# Patient Record
Sex: Female | Born: 1975
Health system: Southern US, Community
[De-identification: ages and names within clinical notes are randomized; demographics above are authoritative.]

## PROBLEM LIST (undated history)

## (undated) DIAGNOSIS — E119 Type 2 diabetes mellitus without complications: Secondary | ICD-10-CM

## (undated) DIAGNOSIS — I10 Essential (primary) hypertension: Secondary | ICD-10-CM

## (undated) DIAGNOSIS — C189 Malignant neoplasm of colon, unspecified: Secondary | ICD-10-CM

## (undated) HISTORY — PX: WISDOM TOOTH EXTRACTION: SHX21

## (undated) HISTORY — DX: Essential (primary) hypertension: I10

## (undated) HISTORY — PX: TUBAL LIGATION: SHX77

## (undated) HISTORY — DX: Malignant neoplasm of colon, unspecified: C18.9

---

## 2001-03-15 ENCOUNTER — Ambulatory Visit (HOSPITAL_COMMUNITY): Admission: RE | Admit: 2001-03-15 | Discharge: 2001-03-15 | Payer: Self-pay | Admitting: *Deleted

## 2001-04-27 ENCOUNTER — Inpatient Hospital Stay (HOSPITAL_COMMUNITY): Admission: AD | Admit: 2001-04-27 | Discharge: 2001-04-27 | Payer: Self-pay | Admitting: *Deleted

## 2001-07-20 ENCOUNTER — Emergency Department (HOSPITAL_COMMUNITY): Admission: EM | Admit: 2001-07-20 | Discharge: 2001-07-20 | Payer: Self-pay | Admitting: Emergency Medicine

## 2001-07-30 ENCOUNTER — Inpatient Hospital Stay (HOSPITAL_COMMUNITY): Admission: AD | Admit: 2001-07-30 | Discharge: 2001-08-01 | Payer: Self-pay | Admitting: *Deleted

## 2003-07-30 ENCOUNTER — Inpatient Hospital Stay (HOSPITAL_COMMUNITY): Admission: AD | Admit: 2003-07-30 | Discharge: 2003-07-30 | Payer: Self-pay | Admitting: *Deleted

## 2003-10-02 ENCOUNTER — Ambulatory Visit (HOSPITAL_COMMUNITY): Admission: RE | Admit: 2003-10-02 | Discharge: 2003-10-02 | Payer: Self-pay | Admitting: *Deleted

## 2004-02-19 ENCOUNTER — Inpatient Hospital Stay (HOSPITAL_COMMUNITY): Admission: AD | Admit: 2004-02-19 | Discharge: 2004-02-21 | Payer: Self-pay | Admitting: *Deleted

## 2004-02-19 ENCOUNTER — Ambulatory Visit: Payer: Self-pay | Admitting: Obstetrics and Gynecology

## 2005-08-10 ENCOUNTER — Emergency Department: Payer: Self-pay | Admitting: Unknown Physician Specialty

## 2005-08-21 ENCOUNTER — Emergency Department: Payer: Self-pay | Admitting: Emergency Medicine

## 2012-07-09 LAB — HM PAP SMEAR

## 2014-01-05 ENCOUNTER — Ambulatory Visit (INDEPENDENT_AMBULATORY_CARE_PROVIDER_SITE_OTHER): Payer: 59 | Admitting: Internal Medicine

## 2014-01-05 ENCOUNTER — Encounter: Payer: Self-pay | Admitting: Internal Medicine

## 2014-01-05 VITALS — BP 102/62 | HR 74 | Temp 98.3°F | Ht 63.25 in | Wt 254.0 lb

## 2014-01-05 DIAGNOSIS — I152 Hypertension secondary to endocrine disorders: Secondary | ICD-10-CM | POA: Insufficient documentation

## 2014-01-05 DIAGNOSIS — Z0000 Encounter for general adult medical examination without abnormal findings: Secondary | ICD-10-CM

## 2014-01-05 DIAGNOSIS — I10 Essential (primary) hypertension: Secondary | ICD-10-CM | POA: Insufficient documentation

## 2014-01-05 LAB — CBC
HCT: 34.8 % — ABNORMAL LOW (ref 36.0–46.0)
Hemoglobin: 11.9 g/dL — ABNORMAL LOW (ref 12.0–15.0)
MCH: 27.9 pg (ref 26.0–34.0)
MCHC: 34.2 g/dL (ref 30.0–36.0)
MCV: 81.7 fL (ref 78.0–100.0)
Platelets: 277 10*3/uL (ref 150–400)
RBC: 4.26 MIL/uL (ref 3.87–5.11)
RDW: 16.3 % — ABNORMAL HIGH (ref 11.5–15.5)
WBC: 5.6 10*3/uL (ref 4.0–10.5)

## 2014-01-05 NOTE — Assessment & Plan Note (Signed)
Low today Will hold lisinopril x 1 month Continue HCTZ Monitor BP at home, call me if > 140/90

## 2014-01-05 NOTE — Progress Notes (Signed)
Pre visit review using our clinic review tool, if applicable. No additional management support is needed unless otherwise documented below in the visit note. 

## 2014-01-05 NOTE — Assessment & Plan Note (Signed)
Start 1500 calorie diet  Use the my fitness pal app Speed up the waling so that you can walk 2-3 miles in 30 minutes

## 2014-01-05 NOTE — Progress Notes (Signed)
HPI  Pt presents to the clinic today to establish care. She has not had a PCP in many years.  HTN: Well controlled on lisinopril and hydrochlorothiazide  Obesity: She is concerned about her weight. She reports she walks 2-3 miles per day in about 30-45. She reports that she does not adhere to any specific diet but she has cut back on portion sizes and sweets.  Flu: 2014 Tetanus: 2-3 years ago LMP: 12/28/13 Pap Smear: 2014 Dentist: as needed  Past Medical History  Diagnosis Date  . Hypertension     Current Outpatient Prescriptions  Medication Sig Dispense Refill  . hydrochlorothiazide (HYDRODIURIL) 25 MG tablet Take 25 mg by mouth daily.      Marland Kitchen lisinopril (PRINIVIL,ZESTRIL) 20 MG tablet Take 20 mg by mouth daily.       No current facility-administered medications for this visit.    No Known Allergies  Family History  Problem Relation Age of Onset  . Hypertension Mother   . Diabetes Maternal Aunt     History   Social History  . Marital Status: Single    Spouse Name: N/A    Number of Children: N/A  . Years of Education: N/A   Occupational History  . Not on file.   Social History Main Topics  . Smoking status: Never Smoker   . Smokeless tobacco: Never Used  . Alcohol Use: Yes     Comment: rare  . Drug Use: No  . Sexual Activity: Not on file   Other Topics Concern  . Not on file   Social History Narrative  . No narrative on file    ROS:  Constitutional: Denies fever, malaise, fatigue, headache or abrupt weight changes.  HEENT: Denies eye pain, eye redness, ear pain, ringing in the ears, wax buildup, runny nose, nasal congestion, bloody nose, or sore throat. Respiratory: Denies difficulty breathing, shortness of breath, cough or sputum production.   Cardiovascular: Denies chest pain, chest tightness, palpitations or swelling in the hands or feet.  Gastrointestinal: Denies abdominal pain, bloating, constipation, diarrhea or blood in the stool.  GU: Denies  frequency, urgency, pain with urination, blood in urine, odor or discharge. Musculoskeletal: Denies decrease in range of motion, difficulty with gait, muscle pain or joint pain and swelling.  Skin: Denies redness, rashes, lesions or ulcercations.  Neurological: Denies dizziness, difficulty with memory, difficulty with speech or problems with balance and coordination.   No other specific complaints in a complete review of systems (except as listed in HPI above).  PE:  BP 102/62  Pulse 74  Temp(Src) 98.3 F (36.8 C) (Oral)  Ht 5' 3.25" (1.607 m)  Wt 254 lb (115.214 kg)  BMI 44.61 kg/m2  SpO2 98%  LMP 12/22/2013 Wt Readings from Last 3 Encounters:  01/05/14 254 lb (115.214 kg)    General: Appears her stated age, obese but well developed, well nourished in NAD. HEENT: Head: normal shape and size; Eyes: sclera white, no icterus, conjunctiva pink, PERRLA and EOMs intact; Ears: Tm's gray and intact, normal light reflex; Nose: mucosa pink and moist, septum midline; Throat/Mouth: Teeth present, mucosa pink and moist, no lesions or ulcerations noted.  Neck: Normal range of motion. Neck supple, trachea midline. No massses, lumps or thyromegaly present.  Cardiovascular: Normal rate and rhythm. S1,S2 noted.  No murmur, rubs or gallops noted. No JVD or BLE edema. No carotid bruits noted. Pulmonary/Chest: Normal effort and positive vesicular breath sounds. No respiratory distress. No wheezes, rales or ronchi noted.  Abdomen: Soft  and nontender. Normal bowel sounds, no bruits noted. No distention or masses noted. Liver, spleen and kidneys non palpable. Musculoskeletal: Normal range of motion. No signs of joint swelling. No difficulty with gait.  Neurological: Alert and oriented. Cranial nerves II-XII intact. Coordination normal. +DTRs bilaterally. Psychiatric: Mood and affect normal. Behavior is normal. Judgment and thought content normal.      Assessment and Plan:  Preventative Health  Maintenance:  Will check screening labs today Encouraged her to visit a dentist on a regular basis  RTC as needed

## 2014-01-05 NOTE — Patient Instructions (Addendum)

## 2014-01-06 LAB — TSH: TSH: 2.75 u[IU]/mL (ref 0.350–4.500)

## 2014-01-06 LAB — COMPREHENSIVE METABOLIC PANEL
ALT: 28 U/L (ref 0–35)
AST: 24 U/L (ref 0–37)
Albumin: 3.7 g/dL (ref 3.5–5.2)
Alkaline Phosphatase: 57 U/L (ref 39–117)
BUN: 8 mg/dL (ref 6–23)
CO2: 29 mEq/L (ref 19–32)
Calcium: 9.2 mg/dL (ref 8.4–10.5)
Chloride: 97 mEq/L (ref 96–112)
Creat: 0.99 mg/dL (ref 0.50–1.10)
Glucose, Bld: 109 mg/dL — ABNORMAL HIGH (ref 70–99)
Potassium: 3.2 mEq/L — ABNORMAL LOW (ref 3.5–5.3)
Sodium: 135 mEq/L (ref 135–145)
Total Bilirubin: 0.5 mg/dL (ref 0.2–1.2)
Total Protein: 7.3 g/dL (ref 6.0–8.3)

## 2014-01-06 LAB — LIPID PANEL
Cholesterol: 135 mg/dL (ref 0–200)
HDL: 36 mg/dL — ABNORMAL LOW (ref >39)
LDL Cholesterol: 74 mg/dL (ref 0–99)
Total CHOL/HDL Ratio: 3.8 Ratio
Triglycerides: 125 mg/dL (ref <150)
VLDL: 25 mg/dL (ref 0–40)

## 2014-01-06 LAB — HEMOGLOBIN A1C
Hgb A1c MFr Bld: 6.3 % — ABNORMAL HIGH (ref <5.7)
Mean Plasma Glucose: 134 mg/dL — ABNORMAL HIGH (ref <117)

## 2014-01-08 ENCOUNTER — Telehealth: Payer: Self-pay | Admitting: Internal Medicine

## 2014-01-08 NOTE — Telephone Encounter (Signed)
Relevant patient education assigned to patient using Emmi. ° °

## 2014-01-10 ENCOUNTER — Other Ambulatory Visit: Payer: Self-pay | Admitting: *Deleted

## 2014-01-10 MED ORDER — POTASSIUM CHLORIDE ER 10 MEQ PO TBCR: 10.0000 meq | EXTENDED_RELEASE_TABLET | Freq: Every day | ORAL | Status: DC

## 2014-02-05 ENCOUNTER — Ambulatory Visit (INDEPENDENT_AMBULATORY_CARE_PROVIDER_SITE_OTHER): Payer: 59 | Admitting: Internal Medicine

## 2014-02-05 ENCOUNTER — Encounter (INDEPENDENT_AMBULATORY_CARE_PROVIDER_SITE_OTHER): Payer: Self-pay

## 2014-02-05 ENCOUNTER — Encounter: Payer: Self-pay | Admitting: Internal Medicine

## 2014-02-05 VITALS — BP 142/88 | HR 66 | Temp 98.5°F | Wt 256.0 lb

## 2014-02-05 DIAGNOSIS — I10 Essential (primary) hypertension: Secondary | ICD-10-CM

## 2014-02-05 MED ORDER — HYDROCHLOROTHIAZIDE 25 MG PO TABS: 25.0000 mg | ORAL_TABLET | Freq: Every day | ORAL | Status: DC

## 2014-02-05 NOTE — Progress Notes (Signed)
Subjective:    Patient ID: Kathy [name], female    DOB: Jun 13, 1975, 38 y.o.   MRN: [id number]  HPI  Pt presents to the clinic today for 1 month follow up of HTN. At her last visit, her Lisinipril was held because she was concerned about her BP being too low. Her BP at that time was 102/62. She was advised to stop the Lisinopril but continue the HCTZ. Her BP today is 142/88. She has gained 2 lbs since her last visit. She is not currently adhering to any diet or exercise regimen.  Review of Systems      Past Medical History  Diagnosis Date  . Hypertension     Current Outpatient Prescriptions  Medication Sig Dispense Refill  . hydrochlorothiazide (HYDRODIURIL) 25 MG tablet Take 25 mg by mouth daily.      Marland Kitchen lisinopril (PRINIVIL,ZESTRIL) 20 MG tablet Take 20 mg by mouth daily.      . potassium chloride (K-DUR) 10 MEQ tablet Take 1 tablet (10 mEq total) by mouth daily.  30 tablet  3   No current facility-administered medications for this visit.    No Known Allergies  Family History  Problem Relation Age of Onset  . Hypertension Mother   . Diabetes Maternal Aunt     History   Social History  . Marital Status: Single    Spouse Name: N/A    Number of Children: N/A  . Years of Education: N/A   Occupational History  . Not on file.   Social History Main Topics  . Smoking status: Never Smoker   . Smokeless tobacco: Never Used  . Alcohol Use: Yes     Comment: rare  . Drug Use: No  . Sexual Activity: Yes    Birth Control/ Protection: Surgical   Other Topics Concern  . Not on file   Social History Narrative  . No narrative on file     Constitutional: Denies fever, malaise, fatigue, headache or abrupt weight changes.  Respiratory: Denies difficulty breathing, shortness of breath, cough or sputum production.   Cardiovascular: Denies chest pain, chest tightness, palpitations or swelling in the hands or feet.  Neurological: Denies dizziness, difficulty with  memory, difficulty with speech or problems with balance and coordination.   No other specific complaints in a complete review of systems (except as listed in HPI above).  Objective:   Physical Exam   BP 142/88  Pulse 66  Temp(Src) 98.5 F (36.9 C) (Oral)  Wt 256 lb (116.121 kg)  SpO2 98% Wt Readings from Last 3 Encounters:  02/05/14 256 lb (116.121 kg)  01/05/14 254 lb (115.214 kg)    [name]: Appears her stated age, obese but well developed, well nourished in NAD. Cardiovascular: Normal rate and rhythm. S1,S2 noted.  No murmur, rubs or gallops noted. No JVD or BLE edema.  Pulmonary/Chest: Normal effort and positive vesicular breath sounds. No respiratory distress. No wheezes, rales or ronchi noted.  Neurological: Alert and oriented. Coordination normal.   BMET    Component Value Date/Time   NA 135 01/05/2014 1353   K 3.2* 01/05/2014 1353   CL 97 01/05/2014 1353   CO2 29 01/05/2014 1353   GLUCOSE 109* 01/05/2014 1353   BUN 8 01/05/2014 1353   CREATININE 0.99 01/05/2014 1353   CALCIUM 9.2 01/05/2014 1353    Lipid Panel     Component Value Date/Time   CHOL 135 01/05/2014 1353   TRIG 125 01/05/2014 1353   HDL 36* 01/05/2014 1353  CHOLHDL 3.8 01/05/2014 1353   VLDL 25 01/05/2014 1353   LDLCALC 74 01/05/2014 1353    CBC    Component Value Date/Time   WBC 5.6 01/05/2014 1353   RBC 4.26 01/05/2014 1353   HGB 11.9* 01/05/2014 1353   HCT 34.8* 01/05/2014 1353   PLT 277 01/05/2014 1353   MCV 81.7 01/05/2014 1353   MCH 27.9 01/05/2014 1353   MCHC 34.2 01/05/2014 1353   RDW 16.3* 01/05/2014 1353    Hgb A1C Lab Results  Component Value Date   HGBA1C 6.3* 01/05/2014        Assessment & Plan:

## 2014-02-05 NOTE — Progress Notes (Signed)
Pre visit review using our clinic review tool, if applicable. No additional management support is needed unless otherwise documented below in the visit note. 

## 2014-02-05 NOTE — Patient Instructions (Signed)

## 2014-02-05 NOTE — Assessment & Plan Note (Signed)
Will continue HCTZ for now Continue to hold the lisinopril Advised her that if she could work on her diet and exercise, that weight loss would improve her BP and help her stay off the medications  RTC in 3 months to follow up

## 2014-03-07 ENCOUNTER — Ambulatory Visit (INDEPENDENT_AMBULATORY_CARE_PROVIDER_SITE_OTHER): Payer: 59 | Admitting: Internal Medicine

## 2014-03-07 ENCOUNTER — Encounter: Payer: Self-pay | Admitting: Internal Medicine

## 2014-03-07 VITALS — BP 118/74 | HR 76 | Temp 99.0°F | Wt 252.0 lb

## 2014-03-07 DIAGNOSIS — B379 Candidiasis, unspecified: Secondary | ICD-10-CM

## 2014-03-07 DIAGNOSIS — A499 Bacterial infection, unspecified: Secondary | ICD-10-CM

## 2014-03-07 DIAGNOSIS — T3695XA Adverse effect of unspecified systemic antibiotic, initial encounter: Secondary | ICD-10-CM

## 2014-03-07 DIAGNOSIS — N760 Acute vaginitis: Secondary | ICD-10-CM

## 2014-03-07 DIAGNOSIS — B9689 Other specified bacterial agents as the cause of diseases classified elsewhere: Secondary | ICD-10-CM

## 2014-03-07 MED ORDER — METRONIDAZOLE 0.75 % VA GEL: 1.0000 | Freq: Two times a day (BID) | VAGINAL | Status: DC

## 2014-03-07 MED ORDER — FLUCONAZOLE 150 MG PO TABS: 150.0000 mg | ORAL_TABLET | Freq: Once | ORAL | Status: DC

## 2014-03-07 NOTE — Progress Notes (Signed)
   Subjective:    Patient ID: Kathy [name], female    DOB: [birth year], 38 y.o.   MRN: [id number]  HPI  Pt presents to the clinic today with c/o vaginal odor and discharge. She reports that she noticed this 1 week ago. The discharge is thin and creamy. It does not itch. She does reports douching just prior to the onset of the symptoms. She has not tried anything OTC. She is sexually active.  Review of Systems      Past Medical History  Diagnosis Date  . Hypertension     Current Outpatient Prescriptions  Medication Sig Dispense Refill  . hydrochlorothiazide (HYDRODIURIL) 25 MG tablet Take 1 tablet (25 mg total) by mouth daily.  30 tablet  2  . lisinopril (PRINIVIL,ZESTRIL) 20 MG tablet Take 20 mg by mouth daily.      . potassium chloride (K-DUR) 10 MEQ tablet Take 1 tablet (10 mEq total) by mouth daily.  30 tablet  3   No current facility-administered medications for this visit.    No Known Allergies  Family History  Problem Relation Age of Onset  . Hypertension Mother   . Diabetes Maternal Aunt     History   Social History  . Marital Status: Single    Spouse Name: N/A    Number of Children: N/A  . Years of Education: N/A   Occupational History  . Not on file.   Social History Main Topics  . Smoking status: Never Smoker   . Smokeless tobacco: Never Used  . Alcohol Use: Yes     Comment: rare  . Drug Use: No  . Sexual Activity: Yes    Birth Control/ Protection: Surgical   Other Topics Concern  . Not on file   Social History Narrative  . No narrative on file     Constitutional: Denies fever, malaise, fatigue, headache or abrupt weight changes.  GU: Pt reports vaginal odor and discharge. Denies urgency, frequency, pain with urination, burning sensation, blood in urine.  No other specific complaints in a complete review of systems (except as listed in HPI above).  Objective:   Physical Exam BP 118/74  Pulse 76  Temp(Src) 99 F (37.2 C) (Oral)   Wt 252 lb (114.306 kg)  SpO2 98%   Constitutional:  Alert, oriented x 4, well developed, well nourished in no apparent distress. Cardiovascular: Normal rate and rhythm. S1,S2 noted.  No murmur, rubs or gallops noted.  Pulmonary/Chest: Normal effort and positive vesicular breath sounds. No respiratory distress. No wheezes, rales or ronchi noted.  Abdomen: Soft and nontender. Normal bowel sounds, no bruits noted. No distention or masses noted. Liver, spleen and kidneys non palpable. Genitourinary: Normal female anatomy. Thin, white discharge noted at vaginal opening. Fishy odor noted.      Assessment & Plan:   Bacterial Vaginosis:  Wet prep: + clue cells, rare yeast, no trich eRx for Metrogel BID x 5 days eRx for diflucan for antibiotic induced yeast infection Stop douching  RTC as needed or if symptoms persist or worsen

## 2014-03-07 NOTE — Patient Instructions (Addendum)
Bacterial Vaginosis Bacterial vaginosis is a vaginal infection that occurs when the normal balance of bacteria in the vagina is disrupted. It results from an overgrowth of certain bacteria. This is the most common vaginal infection in women of childbearing age. Treatment is important to prevent complications, especially in pregnant women, as it can cause a premature delivery. CAUSES  Bacterial vaginosis is caused by an increase in harmful bacteria that are normally present in smaller amounts in the vagina. Several different kinds of bacteria can cause bacterial vaginosis. However, the reason that the condition develops is not fully understood. RISK FACTORS Certain activities or behaviors can put you at an increased risk of developing bacterial vaginosis, including:  Having a new sex partner or multiple sex partners.  Douching.  Using an intrauterine device (IUD) for contraception. Women do not get bacterial vaginosis from toilet seats, bedding, swimming pools, or contact with objects around them. SIGNS AND SYMPTOMS  Some women with bacterial vaginosis have no signs or symptoms. Common symptoms include:  Grey vaginal discharge.  A fishlike odor with discharge, especially after sexual intercourse.  Itching or burning of the vagina and vulva.  Burning or pain with urination. DIAGNOSIS  Your health care provider will take a medical history and examine the vagina for signs of bacterial vaginosis. A sample of vaginal fluid may be taken. Your health care provider will look at this sample under a microscope to check for bacteria and abnormal cells. A vaginal pH test may also be done.  TREATMENT  Bacterial vaginosis may be treated with antibiotic medicines. These may be given in the form of a pill or a vaginal cream. A second round of antibiotics may be prescribed if the condition comes back after treatment.  HOME CARE INSTRUCTIONS   Only take over-the-counter or prescription medicines as  directed by your health care provider.  If antibiotic medicine was prescribed, take it as directed. Make sure you finish it even if you start to feel better.  Do not have sex until treatment is completed.  Tell all sexual partners that you have a vaginal infection. They should see their health care provider and be treated if they have problems, such as a mild rash or itching.  Practice safe sex by using condoms and only having one sex partner. SEEK MEDICAL CARE IF:   Your symptoms are not improving after 3 days of treatment.  You have increased discharge or pain.  You have a fever. MAKE SURE YOU:   Understand these instructions.  Will watch your condition.  Will get help right away if you are not doing well or get worse. FOR MORE INFORMATION  Centers for Disease Control and Prevention, Division of STD Prevention: www.cdc.gov/std American Sexual Health Association (ASHA): www.ashastd.org  Document Released: 05/25/2005 Document Revised: 03/15/2013 Document Reviewed: 01/04/2013 ExitCare Patient Information 2015 [employer]. This information is not intended to replace advice given to you by your health care provider. Make sure you discuss any questions you have with your health care provider.  

## 2014-03-07 NOTE — Progress Notes (Signed)
Pre visit review using our clinic review tool, if applicable. No additional management support is needed unless otherwise documented below in the visit note. 

## 2014-05-07 ENCOUNTER — Ambulatory Visit (INDEPENDENT_AMBULATORY_CARE_PROVIDER_SITE_OTHER): Payer: 59 | Admitting: Internal Medicine

## 2014-05-07 ENCOUNTER — Encounter: Payer: Self-pay | Admitting: Internal Medicine

## 2014-05-07 VITALS — BP 114/82 | HR 76 | Temp 98.4°F | Wt 262.0 lb

## 2014-05-07 DIAGNOSIS — E876 Hypokalemia: Secondary | ICD-10-CM

## 2014-05-07 DIAGNOSIS — T502X5A Adverse effect of carbonic-anhydrase inhibitors, benzothiadiazides and other diuretics, initial encounter: Secondary | ICD-10-CM | POA: Insufficient documentation

## 2014-05-07 DIAGNOSIS — I10 Essential (primary) hypertension: Secondary | ICD-10-CM

## 2014-05-07 LAB — BASIC METABOLIC PANEL
BUN: 10 mg/dL (ref 6–23)
CHLORIDE: 97 meq/L (ref 96–112)
CO2: 27 meq/L (ref 19–32)
Calcium: 8.6 mg/dL (ref 8.4–10.5)
Creatinine, Ser: 0.8 mg/dL (ref 0.4–1.2)
GFR: 112.02 mL/min (ref >60.00)
GLUCOSE: 181 mg/dL — AB (ref 70–99)
Potassium: 3.3 mEq/L — ABNORMAL LOW (ref 3.5–5.1)
Sodium: 132 mEq/L — ABNORMAL LOW (ref 135–145)

## 2014-05-07 MED ORDER — POTASSIUM CHLORIDE ER 10 MEQ PO TBCR: 10.0000 meq | EXTENDED_RELEASE_TABLET | Freq: Two times a day (BID) | ORAL | Status: DC

## 2014-05-07 NOTE — Addendum Note (Signed)
Addended by: Roena MaladyEVONTENNO, Daryus Sowash Y on: 05/07/2014 02:57 PM   Modules accepted: Orders, Medications

## 2014-05-07 NOTE — Assessment & Plan Note (Signed)
Well controlled on HCTZ alone Will d/c Lisinopril Repeat BMET today

## 2014-05-07 NOTE — Progress Notes (Signed)
Subjective:    Patient ID: Kathy [name], female    DOB: [birth year], 38 y.o.   MRN: [id number]  HPI  Pt presents to the clinic today for 3 month follow up of HTN. At her last visit, she had been holding her Lisinopril for low blood pressures. She was advised to continue her HCTZ and potassium supplement secondary to diuretic induced hypokalemia. Her blood pressure today was 114/82. She is tolerating the medication well without side effects. She has gained 10 lbs since her last visit.  Review of Systems      Past Medical History  Diagnosis Date  . Hypertension     Current Outpatient Prescriptions  Medication Sig Dispense Refill  . hydrochlorothiazide (HYDRODIURIL) 25 MG tablet Take 1 tablet (25 mg total) by mouth daily. 30 tablet 2  . potassium chloride (K-DUR) 10 MEQ tablet Take 1 tablet (10 mEq total) by mouth daily. 30 tablet 3  . lisinopril (PRINIVIL,ZESTRIL) 20 MG tablet Take 20 mg by mouth daily.     No current facility-administered medications for this visit.    No Known Allergies  Family History  Problem Relation Age of Onset  . Hypertension Mother   . Diabetes Maternal Aunt     History   Social History  . Marital Status: Single    Spouse Name: N/A    Number of Children: N/A  . Years of Education: N/A   Occupational History  . Not on file.   Social History Main Topics  . Smoking status: Never Smoker   . Smokeless tobacco: Never Used  . Alcohol Use: Yes     Comment: rare  . Drug Use: No  . Sexual Activity: Yes    Birth Control/ Protection: Surgical   Other Topics Concern  . Not on file   Social History Narrative     Constitutional: Denies fever, malaise, fatigue, headache or abrupt weight changes.  Respiratory: Denies difficulty breathing, shortness of breath, cough or sputum production.   Cardiovascular: Denies chest pain, chest tightness, palpitations or swelling in the hands or feet.  Neurological: Denies dizziness, difficulty with  memory, difficulty with speech or problems with balance and coordination.   No other specific complaints in a complete review of systems (except as listed in HPI above).  Objective:   Physical Exam  BP 114/82 mmHg  Pulse 76  Temp(Src) 98.4 F (36.9 C) (Oral)  Wt 262 lb (118.842 kg)  SpO2 99% Wt Readings from Last 3 Encounters:  05/07/14 262 lb (118.842 kg)  03/07/14 252 lb (114.306 kg)  02/05/14 256 lb (116.121 kg)    General: Appears her stated age, obese but well developed, well nourished in NAD. Cardiovascular: Normal rate and rhythm. S1,S2 noted.  No murmur, rubs or gallops noted. No JVD or BLE edema. No carotid bruits noted. Pulmonary/Chest: Normal effort and positive vesicular breath sounds. No respiratory distress. No wheezes, rales or ronchi noted.  Neurological: Alert and oriented.   BMET    Component Value Date/Time   NA 135 01/05/2014 1353   K 3.2* 01/05/2014 1353   CL 97 01/05/2014 1353   CO2 29 01/05/2014 1353   GLUCOSE 109* 01/05/2014 1353   BUN 8 01/05/2014 1353   CREATININE 0.99 01/05/2014 1353   CALCIUM 9.2 01/05/2014 1353    Lipid Panel     Component Value Date/Time   CHOL 135 01/05/2014 1353   TRIG 125 01/05/2014 1353   HDL 36* 01/05/2014 1353   CHOLHDL 3.8 01/05/2014 1353   VLDL  25 01/05/2014 1353   LDLCALC 74 01/05/2014 1353    CBC    Component Value Date/Time   WBC 5.6 01/05/2014 1353   RBC 4.26 01/05/2014 1353   HGB 11.9* 01/05/2014 1353   HCT 34.8* 01/05/2014 1353   PLT 277 01/05/2014 1353   MCV 81.7 01/05/2014 1353   MCH 27.9 01/05/2014 1353   MCHC 34.2 01/05/2014 1353   RDW 16.3* 01/05/2014 1353    Hgb A1C Lab Results  Component Value Date   HGBA1C 6.3* 01/05/2014         Assessment & Plan:

## 2014-05-07 NOTE — Progress Notes (Signed)
Pre visit review using our clinic review tool, if applicable. No additional management support is needed unless otherwise documented below in the visit note. 

## 2014-05-07 NOTE — Patient Instructions (Signed)

## 2014-05-07 NOTE — Assessment & Plan Note (Signed)
On potassium supplement Will check BMET today Will adjust supplement if needed based on labs

## 2014-05-10 ENCOUNTER — Other Ambulatory Visit: Payer: Self-pay | Admitting: Internal Medicine

## 2014-06-05 ENCOUNTER — Other Ambulatory Visit: Payer: 59

## 2014-06-05 ENCOUNTER — Telehealth: Payer: Self-pay

## 2014-06-05 DIAGNOSIS — E876 Hypokalemia: Secondary | ICD-10-CM

## 2014-06-05 NOTE — Addendum Note (Signed)
Addended by: Roena MaladyEVONTENNO, Joetta Delprado Y on: 06/05/2014 04:07 PM   Modules accepted: Orders

## 2014-06-05 NOTE — Telephone Encounter (Signed)
Pt left v/m; pt finding whole K pills in her BMs. Pt drinking a lot of water with medication.  Pt taking both K pills in early AM before eats breakfast. Pt does not want to change med because her energy level is good now. Pt wants to know if should take med after eating or what to do, pt request cb. [facility]

## 2014-06-05 NOTE — Telephone Encounter (Signed)
Pt is aware and made a lab appt for tomorrow and lab has been future ordered

## 2014-06-05 NOTE — Telephone Encounter (Signed)
Have her come in to recheck her potassium, depending on the level, we will decide what to do. Please order future potassium

## 2014-06-06 ENCOUNTER — Other Ambulatory Visit: Payer: Self-pay | Admitting: Internal Medicine

## 2014-06-06 ENCOUNTER — Other Ambulatory Visit (INDEPENDENT_AMBULATORY_CARE_PROVIDER_SITE_OTHER): Payer: 59

## 2014-06-06 DIAGNOSIS — E876 Hypokalemia: Secondary | ICD-10-CM

## 2014-06-06 LAB — POTASSIUM: Potassium: 3.4 mEq/L — ABNORMAL LOW (ref 3.5–5.1)

## 2014-06-06 MED ORDER — POTASSIUM CHLORIDE CRYS ER 20 MEQ PO TBCR: 20.0000 meq | EXTENDED_RELEASE_TABLET | Freq: Two times a day (BID) | ORAL | Status: DC

## 2014-08-21 ENCOUNTER — Other Ambulatory Visit: Payer: Self-pay | Admitting: Internal Medicine

## 2014-08-22 MED ORDER — POTASSIUM CHLORIDE CRYS ER 20 MEQ PO TBCR: 20.0000 meq | EXTENDED_RELEASE_TABLET | Freq: Two times a day (BID) | ORAL | Status: DC

## 2014-08-24 ENCOUNTER — Other Ambulatory Visit: Payer: Self-pay | Admitting: Internal Medicine

## 2014-09-12 ENCOUNTER — Encounter: Payer: Self-pay | Admitting: Internal Medicine

## 2014-09-12 ENCOUNTER — Ambulatory Visit (INDEPENDENT_AMBULATORY_CARE_PROVIDER_SITE_OTHER): Payer: 59 | Admitting: Internal Medicine

## 2014-09-12 VITALS — BP 128/80 | HR 68 | Temp 98.8°F | Wt 247.0 lb

## 2014-09-12 DIAGNOSIS — K5900 Constipation, unspecified: Secondary | ICD-10-CM | POA: Diagnosis not present

## 2014-09-12 NOTE — Progress Notes (Signed)
Subjective:    Patient ID: Kathy [name], female    DOB: May 06, 1976, 39 y.o.   MRN: [id number]  HPI  Pt presents to the clinic today with c/o constipation. She reports this started 1-2 weeks ago after she started making diet changes to help her lose weight. She is having some small BM's, but she feels like she is not emptying completely. She has also felt very bloated. 3 days ago, she reports she took approximately 4 laxatives. This did produce a large, loose BM, which was normal in color. She denies any blood in her stool. She has not had a BM in the last 2 days. She is passing gas. She denies nausea and vomiting.  Review of Systems      Past Medical History  Diagnosis Date  . Hypertension     Current Outpatient Prescriptions  Medication Sig Dispense Refill  . hydrochlorothiazide (HYDRODIURIL) 25 MG tablet TAKE 1 TABLET BY MOUTH DAILY. 30 tablet 4  . potassium chloride SA (K-DUR,KLOR-CON) 20 MEQ tablet Take 1 tablet (20 mEq total) by mouth 2 (two) times daily. 60 tablet 4   No current facility-administered medications for this visit.    No Known Allergies  Family History  Problem Relation Age of Onset  . Hypertension Mother   . Diabetes Maternal Aunt     History   Social History  . Marital Status: Single    Spouse Name: N/A  . Number of Children: N/A  . Years of Education: N/A   Occupational History  . Not on file.   Social History Main Topics  . Smoking status: Never Smoker   . Smokeless tobacco: Never Used  . Alcohol Use: Yes     Comment: rare  . Drug Use: No  . Sexual Activity: Yes    Birth Control/ Protection: Surgical   Other Topics Concern  . Not on file   Social History Narrative     Constitutional: Denies fever, malaise, fatigue, headache or abrupt weight changes.  Respiratory: Denies difficulty breathing, shortness of breath, cough or sputum production.   Cardiovascular: Denies chest pain, chest tightness, palpitations or swelling in  the hands or feet.  Gastrointestinal: Pt reports  bloating and constipation. Denies abdominal pain, diarrhea or blood in the stool.    No other specific complaints in a complete review of systems (except as listed in HPI above).  Objective:   Physical Exam   BP 128/80 mmHg  Pulse 68  Temp(Src) 98.8 F (37.1 C) (Oral)  Wt 247 lb (112.038 kg)  SpO2 98%  LMP 08/16/2014 Wt Readings from Last 3 Encounters:  09/12/14 247 lb (112.038 kg)  05/07/14 262 lb (118.842 kg)  03/07/14 252 lb (114.306 kg)    General: Appears her stated age, well developed, well nourished in NAD. Cardiovascular: Normal rate and rhythm. S1,S2 noted.  No murmur, rubs or gallops noted.  Pulmonary/Chest: Normal effort and positive vesicular breath sounds. No respiratory distress. No wheezes, rales or ronchi noted.  Abdomen: Distended but soft. Nontender. Hypoactivel bowel sounds, no bruits noted. No masses noted.    BMET    Component Value Date/Time   NA 132* 05/07/2014 0908   K 3.4* 06/06/2014 0850   CL 97 05/07/2014 0908   CO2 27 05/07/2014 0908   GLUCOSE 181* 05/07/2014 0908   BUN 10 05/07/2014 0908   CREATININE 0.8 05/07/2014 0908   CREATININE 0.99 01/05/2014 1353   CALCIUM 8.6 05/07/2014 0908    Lipid Panel     Component  Value Date/Time   CHOL 135 01/05/2014 1353   TRIG 125 01/05/2014 1353   HDL 36* 01/05/2014 1353   CHOLHDL 3.8 01/05/2014 1353   VLDL 25 01/05/2014 1353   LDLCALC 74 01/05/2014 1353    CBC    Component Value Date/Time   WBC 5.6 01/05/2014 1353   RBC 4.26 01/05/2014 1353   HGB 11.9* 01/05/2014 1353   HCT 34.8* 01/05/2014 1353   PLT 277 01/05/2014 1353   MCV 81.7 01/05/2014 1353   MCH 27.9 01/05/2014 1353   MCHC 34.2 01/05/2014 1353   RDW 16.3* 01/05/2014 1353    Hgb A1C Lab Results  Component Value Date   HGBA1C 6.3* 01/05/2014        Assessment & Plan:   Constipation:  Increase your water intake Discussed consuming a high fiber diet (try Metamucil  daily) A probiotic such as phillips colon health or algin daily If no relief after 1 week, start taking Mirilax 17 gm every other day until you become regular  RTC as needed or if symptoms persist or worsen RTC as needed or if symptoms persist or worsen

## 2014-09-12 NOTE — Progress Notes (Signed)
Pre visit review using our clinic review tool, if applicable. No additional management support is needed unless otherwise documented below in the visit note. 

## 2014-09-12 NOTE — Patient Instructions (Signed)

## 2014-12-13 ENCOUNTER — Telehealth: Payer: Self-pay

## 2014-12-13 NOTE — Telephone Encounter (Signed)
[facility] and better control hypertension.Please advise. Last f/u appt was 05/07/2014. CPX scheduled 02/05/15 with Nicki Reaperegina Baity NP.

## 2014-12-13 NOTE — Telephone Encounter (Signed)
We can discuss this at her upcoming physical

## 2014-12-21 ENCOUNTER — Encounter: Payer: Self-pay | Admitting: Internal Medicine

## 2014-12-21 ENCOUNTER — Telehealth: Payer: Self-pay | Admitting: Internal Medicine

## 2014-12-21 ENCOUNTER — Ambulatory Visit (INDEPENDENT_AMBULATORY_CARE_PROVIDER_SITE_OTHER): Payer: 59 | Admitting: Internal Medicine

## 2014-12-21 NOTE — Progress Notes (Signed)
Pre visit review using our clinic review tool, if applicable. No additional management support is needed unless otherwise documented below in the visit note. 

## 2014-12-21 NOTE — Telephone Encounter (Signed)
Opened in error

## 2014-12-21 NOTE — Progress Notes (Signed)
Subjective:    Patient ID: Kathy [name], female    DOB: 1975-11-29, 39 y.o.   MRN: [id number]  HPI  Pt presents to the clinic today requesting a referral to see a nutritionist. She knows she is overweight. She has been working with a Psychologist, educational for the last 2 weeks. She is doing cardio and weights, 2 days a week for 30 minutes. She walks on the off days. The trainer advised her to see a nutritionist. She feels like her weight yo-yo's and her weights are ranging 253-247 lbs.   Breakfast: 2 boiled eggs and wheat toast Snack: Yogurt parfait, apple, orange or banana Lunch: Chicken with veggies Snack: Yogurt or protein shake Dinner: Chicken with veggies  She reports that she drinks mostly water Review of Systems       Past Medical History  Diagnosis Date  . Hypertension     Current Outpatient Prescriptions  Medication Sig Dispense Refill  . hydrochlorothiazide (HYDRODIURIL) 25 MG tablet TAKE 1 TABLET BY MOUTH DAILY. 30 tablet 4  . potassium chloride SA (K-DUR,KLOR-CON) 20 MEQ tablet Take 1 tablet (20 mEq total) by mouth 2 (two) times daily. 60 tablet 4   No current facility-administered medications for this visit.    No Known Allergies  Family History  Problem Relation Age of Onset  . Hypertension Mother   . Diabetes Maternal Aunt     History   Social History  . Marital Status: Single    Spouse Name: N/A  . Number of Children: N/A  . Years of Education: N/A   Occupational History  . Not on file.   Social History Main Topics  . Smoking status: Never Smoker   . Smokeless tobacco: Never Used  . Alcohol Use: Yes     Comment: rare  . Drug Use: No  . Sexual Activity: Yes    Birth Control/ Protection: Surgical   Other Topics Concern  . Not on file   Social History Narrative     Constitutional: Denies fever, malaise, fatigue, headache or abrupt weight changes.  Respiratory: Denies difficulty breathing, shortness of breath, cough or sputum production.     Cardiovascular: Denies chest pain, chest tightness, palpitations or swelling in the hands or feet.  Gastrointestinal: Denies abdominal pain, bloating, constipation, diarrhea or blood in the stool.   No other specific complaints in a complete review of systems (except as listed in HPI above).  Objective:   Physical Exam   BP 120/78 mmHg  Pulse 81  Temp(Src) 98.8 F (37.1 C) (Oral)  Wt 250 lb 8 oz (113.626 kg)  SpO2 98%  LMP 12/13/2014  Wt Readings from Last 3 Encounters:  12/21/14 250 lb 8 oz (113.626 kg)  09/12/14 247 lb (112.038 kg)  05/07/14 262 lb (118.842 kg)    [name]: Appears her stated age, obese in NAD. Cardiovascular: Normal rate and rhythm. S1,S2 noted.  No murmur, rubs or gallops noted. No JVD or BLE edema. No carotid bruits noted. Pulmonary/Chest: Normal effort and positive vesicular breath sounds. No respiratory distress. No wheezes, rales or ronchi noted.  Abdomen: Soft and nontender. Normal bowel sounds, no bruits noted. No distention or masses noted.   BMET    Component Value Date/Time   NA 132* 05/07/2014 0908   K 3.4* 06/06/2014 0850   CL 97 05/07/2014 0908   CO2 27 05/07/2014 0908   GLUCOSE 181* 05/07/2014 0908   BUN 10 05/07/2014 0908   CREATININE 0.8 05/07/2014 0908   CREATININE 0.99 01/05/2014 1353  CALCIUM 8.6 05/07/2014 0908    Lipid Panel     Component Value Date/Time   CHOL 135 01/05/2014 1353   TRIG 125 01/05/2014 1353   HDL 36* 01/05/2014 1353   CHOLHDL 3.8 01/05/2014 1353   VLDL 25 01/05/2014 1353   LDLCALC 74 01/05/2014 1353    CBC    Component Value Date/Time   WBC 5.6 01/05/2014 1353   RBC 4.26 01/05/2014 1353   HGB 11.9* 01/05/2014 1353   HCT 34.8* 01/05/2014 1353   PLT 277 01/05/2014 1353   MCV 81.7 01/05/2014 1353   MCH 27.9 01/05/2014 1353   MCHC 34.2 01/05/2014 1353   RDW 16.3* 01/05/2014 1353    Hgb A1C Lab Results  Component Value Date   HGBA1C 6.3* 01/05/2014       Assessment & Plan:

## 2014-12-21 NOTE — Patient Instructions (Signed)
Reading Food Labels  Foods that are packaged or in containers have a Nutrition Facts panel on the side or back. This is commonly called the food label. The food label helps you make healthy food choices by providing information about serving size and the amount of calories and various nutrients in the food. You can check the food label to find out if the food contains high or low amounts of nutrients that you want to limit in your diet. You can also use the food label to see if the food is a good source of the nutrients that you want to make sure are included in your diet.  HOW DO I READ THE FOOD LABEL?  · Begin by checking the serving size and number of servings in the container. All of the nutrition information listed on the food label is based on one serving. If you eat more than one serving, you must multiply the amounts (such as calories, grams of saturated fat, or milligrams of sodium) by the number of servings.  · Check the calories. Choosing foods that are low in calories can help you manage your weight.  · Look at the numbers in the % Daily Value column for each listed nutrient. This gives you an idea of how much of the daily recommended amount for that nutrient is provided in one serving of the food. A daily value of 5% or less is considered low. A daily value of 20% or more is considered high.  · Check the amounts for the items you should limit in your diet. These include:  ¨ Total fat.  ¨ Saturated fat.  ¨ Trans fat.  ¨ Cholesterol.  ¨ Sodium.  · Check the amounts for the items you should make sure you get enough of. These include:  ¨ Dietary fiber.  ¨ Vitamins A and C.  ¨ Calcium.  ¨ Iron.  WHAT INFORMATION IS PROVIDED ON THE FOOD LABEL?  Serving Information  · Serving size.  ¨ The serving size is listed in cups or pieces. The nutrient amounts listed on the food label apply to this amount of the food.  · Servings per container or package.  ¨ This shows the number of servings you can expect to get from  the container or package if you follow the suggested serving size.  Amount Per Serving  · Calories.  ¨ The number of calories in one serving of the food. This information is helpful in managing weight. Low-calorie foods contain 40 calories or less. High-calorie foods contain 400 or more calories.  · Calories from fat.  ¨ The number of calories that come from fat in one serving.  Percent Daily Value  Percent Daily Value (shown on the label as % Daily Value) tells you what percent of the daily value for each nutrient one serving provides. The daily value is the recommended amount of the nutrient that you should get each day. For example, if 15% is listed next to dietary fiber, it means that one serving of the food will give you 15% of the recommended amount of fiber that you should get in a day. The daily values are based on a 2,000-calorie-per-day diet. You may get more or less than 2,000 calories in your diet each day, but the % Daily Value gives you an idea of whether the food contains a high or low amount of the listed nutrient. A daily value of 5% or less is low. A daily value of 20% or more is   high.  Total Fat  Total fat shows you the total amount of fat in one serving (listed in grams). Foods with high amounts of fat usually have higher calories and may lead to weight gain. Two of the fats that make up a portion of the total fat are included on the label:  · Saturated fat.  ¨ This number is the amount of saturated fat in one serving (listed in grams). Saturated fat increases the amount of blood cholesterol and should be limited to less than 7% of total calories each day. This means that if you eat 2,000 calories each day, you should eat less than 140 calories from saturated fat.    · Trans fat.  ¨ This number is the amount of trans fat in one serving (listed in grams). Trans fat is the most unhealthy fat for heart health and should be limited to as little as possible (less than 2 grams per  day).  Cholesterol  The amount of cholesterol in one serving is listed in milligrams. Cholesterol should be limited to no more than 300 mg each day.  Sodium  The amount of sodium in one serving is listed in milligrams. Most people should limit their sodium intake to 2,300 mg a day.  Total Carbohydrate  This number shows the amount of total carbohydrate in one serving (listed in grams). This information can help people with diabetes manage the amount of carbohydrate they eat. Two of the carbohydrates that make up a portion of the total carbohydrate are included on the label:  · Dietary fiber.  ¨ The amount of dietary fiber in one serving is listed in grams. Most people should eat at least 25 g of dietary fiber each day.    · Sugars.  ¨ The amount of sugar in one serving is also listed in grams. This value includes both naturally occurring sugars from fruit and milk and added sugars such as honey or table sugar.  Protein  The amount of protein in one serving is listed in grams.    Vitamins and Minerals  The % Daily Value is listed for vitamin A, vitamin C, calcium, and iron. Some food labels also list % Daily Values for some other vitamins or minerals.    WHAT OTHER IMPORTANT LABELING IS ON THE FOOD PACKAGE?  Ingredients   Food labels will list each ingredient in the food. The first ingredient listed is the ingredient that the food has the most of. The ingredients are listed in the order of their amount by weight from highest to lowest.  Food Allergen Labeling  Food labels may also include a food allergen warning. Listed here are ingredients that can cause allergic reactions in some people. The potential allergens are listed behind the word "Contains" or "May contain." Examples of ingredients that may be listed are wheat, dairy, eggs, soy, and nuts. If a person knows that he or she is allergic to one of these ingredients, he or she will know to avoid that food.  WHERE CAN I FIND MORE INFORMATION?  [place] Food and Drug  Administration: www.fda.gov  Document Released: 05/25/2005 Document Revised: 05/30/2013 Document Reviewed: 04/17/2013  ExitCare® Patient Information ©2015 [employer]. This information is not intended to replace advice given to you by your health care provider. Make sure you discuss any questions you have with your health care provider.

## 2014-12-21 NOTE — Assessment & Plan Note (Signed)
Discussed foods to avoid, low carb high protein diet Encouraged her to cut out the low fat yogurt and replace with greek yougurt Referral to nurition- see marion on the way out to schedule

## 2014-12-25 ENCOUNTER — Encounter: Payer: 59 | Attending: Internal Medicine | Admitting: Dietician

## 2014-12-25 ENCOUNTER — Encounter: Payer: Self-pay | Admitting: Dietician

## 2014-12-25 VITALS — Ht 64.0 in | Wt 249.2 lb

## 2014-12-25 DIAGNOSIS — E669 Obesity, unspecified: Secondary | ICD-10-CM | POA: Insufficient documentation

## 2014-12-25 NOTE — Patient Instructions (Signed)
Estimate carbohydrate servings at meals and snacks. Spread 10 servings of carbohydrate over 3 meals and 2-3 snacks. Include a snack 1 hour before work-out that consists of a protein food and a serving of starch + 1 serving of fruit. Consider drinking Boost or a supplement after the work-out that includes protein and carbohydrate. Include a starch serving with each meal.  Strive for 5 servings of fruits/vegetables per day. Count 1 cup of cooked vegetables as 2 servings of vegetables. Read labels for sodium. The recommendation is no more than 1500 mg/day.

## 2014-12-25 NOTE — Progress Notes (Signed)
Medical Nutrition Therapy: Visit start time: 13:30  end time: 14:30  Assessment:  Diagnosis: obesity Past medical history: hypertension Psychosocial issues/ stress concerns: Pt. rates her stress as moderate and indicates "ok" as to how she is dealing with her stress. Preferred learning method:  . Auditory . Visual . Hands-on  Current weight: 249.2  Height: 64 in Medications, supplements: see list  Progress and evaluation: Patient states she has been working "out" at the [employer] gym and Systems analystpersonal trainer suggested that she meet with a dietitian. She states that her weight has fluctuated this past year between 240-260 lbs. She has been trying to restrict carbohydrate and is finding this difficult to maintain. She likes a variety of fruits and vegetables and includes daily. She has eliminated all beverages except water and states that she drinks 64+ oz.per day.She would like a better understanding of how to balance protein with carbohydrate.   Physical activity: 2 days per week- cardio for 30 minutes  Dietary Intake:  Usual eating pattern includes 3 meals and 2-3 snacks per day. 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 Otherwise, she states she does not eat "out". Breakfast: 7:30 2 boiled eggs or wheat toast or Premier protein shake Snack: some type of fruit Lunch: 11:30am baked chicken, broccoli or greens Snack:  Yogurt parfait Supper: 5:00pm similar to lunch Snack: States she rarely eats after her dinner meal Beverages: water  Nutrition Care Education:  Weight control:Instructed on a meal plan based on 1600 calories including carbohydrate counting and balance of carbohydrate, protein, fat and non-starchy vegetables. Use food guide plate and food models to demonstrate. Gave and reviewed sample menu. Hypertension:  importance of controlling BP, identifying high sodium foods, sodium intake  guidelines  Other lifestyle changes:  benefits  of making changes, increasing motivation, readiness for change, identifying habits that need to change  Nutritional Diagnosis:  McKenzie-3.3 Overweight/obesity As related to history of excessive calorie intake.  As evidenced by BMI.  Intervention:  Estimate carbohydrate servings at meals and snacks. Spread 10 servings of carbohydrate over 3 meals and 2-3 snacks. Include a snack 1 hour before work-out that consists of a protein food and a serving of starch + 1 serving of fruit. Consider drinking Boost or a supplement after the work-out that includes protein and carbohydrate. Include a starch serving with each meal.  Strive for 5 servings of fruits/vegetables per day. Count 1 cup of cooked vegetables as 2 servings of vegetables. Read labels for sodium. The recommendation is no more than 1500 mg/day. Work out 2-3 more days/week.  Education Materials given:  . Plate Planner . Food lists/ Planning A Balanced Meal . Sample meal pattern/ menus . Goals/ instructions  Learner/ who was taught:  . Patient  Level of understanding: . Partial understanding; needs review/ practice Demonstrated degree of understanding via:   Teach back Learning barriers: . None  Willingness to learn/ readiness for change: . Eager, change in progress  Monitoring and Evaluation:  Dietary intake, exercise, and body weight      follow up: 02/06/15 at 8:00am

## 2015-01-08 ENCOUNTER — Other Ambulatory Visit: Payer: Self-pay | Admitting: Internal Medicine

## 2015-01-23 ENCOUNTER — Other Ambulatory Visit: Payer: Self-pay | Admitting: Internal Medicine

## 2015-01-23 DIAGNOSIS — Z0000 Encounter for general adult medical examination without abnormal findings: Secondary | ICD-10-CM

## 2015-01-29 ENCOUNTER — Other Ambulatory Visit (INDEPENDENT_AMBULATORY_CARE_PROVIDER_SITE_OTHER): Payer: 59

## 2015-01-29 DIAGNOSIS — Z0000 Encounter for general adult medical examination without abnormal findings: Secondary | ICD-10-CM | POA: Diagnosis not present

## 2015-01-29 LAB — COMPREHENSIVE METABOLIC PANEL
ALBUMIN: 3.4 g/dL — AB (ref 3.5–5.2)
ALK PHOS: 52 U/L (ref 39–117)
ALT: 14 U/L (ref 0–35)
AST: 15 U/L (ref 0–37)
BUN: 10 mg/dL (ref 6–23)
CHLORIDE: 103 meq/L (ref 96–112)
CO2: 31 mEq/L (ref 19–32)
Calcium: 9.3 mg/dL (ref 8.4–10.5)
Creatinine, Ser: 0.74 mg/dL (ref 0.40–1.20)
GFR: 113.31 mL/min (ref >60.00)
Glucose, Bld: 93 mg/dL (ref 70–99)
Potassium: 3.7 mEq/L (ref 3.5–5.1)
SODIUM: 138 meq/L (ref 135–145)
TOTAL PROTEIN: 7.3 g/dL (ref 6.0–8.3)
Total Bilirubin: 0.6 mg/dL (ref 0.2–1.2)

## 2015-01-29 LAB — CBC
HCT: 38 % (ref 36.0–46.0)
HEMOGLOBIN: 12.6 g/dL (ref 12.0–15.0)
MCHC: 33.1 g/dL (ref 30.0–36.0)
MCV: 82.9 fl (ref 78.0–100.0)
Platelets: 227 10*3/uL (ref 150.0–400.0)
RBC: 4.58 Mil/uL (ref 3.87–5.11)
RDW: 16.9 % — ABNORMAL HIGH (ref 11.5–15.5)
WBC: 4.2 10*3/uL (ref 4.0–10.5)

## 2015-01-29 LAB — LIPID PANEL
CHOLESTEROL: 113 mg/dL (ref 0–200)
HDL: 34.7 mg/dL — ABNORMAL LOW (ref >39.00)
LDL Cholesterol: 57 mg/dL (ref 0–99)
NonHDL: 78.74
TRIGLYCERIDES: 108 mg/dL (ref 0.0–149.0)
Total CHOL/HDL Ratio: 3
VLDL: 21.6 mg/dL (ref 0.0–40.0)

## 2015-01-29 LAB — HEMOGLOBIN A1C: Hgb A1c MFr Bld: 6 % (ref 4.6–6.5)

## 2015-01-30 ENCOUNTER — Other Ambulatory Visit: Payer: 59

## 2015-02-05 ENCOUNTER — Other Ambulatory Visit (HOSPITAL_COMMUNITY)
Admission: RE | Admit: 2015-02-05 | Discharge: 2015-02-05 | Disposition: A | Discharge status: Home / Self Care | Payer: 59 | Source: Ambulatory Visit | Attending: Internal Medicine | Admitting: Internal Medicine

## 2015-02-05 ENCOUNTER — Ambulatory Visit (INDEPENDENT_AMBULATORY_CARE_PROVIDER_SITE_OTHER): Payer: 59 | Admitting: Internal Medicine

## 2015-02-05 ENCOUNTER — Encounter: Payer: Self-pay | Admitting: Internal Medicine

## 2015-02-05 VITALS — BP 126/84 | HR 60 | Temp 98.9°F | Ht 63.5 in | Wt 247.0 lb

## 2015-02-05 DIAGNOSIS — E876 Hypokalemia: Secondary | ICD-10-CM | POA: Diagnosis not present

## 2015-02-05 DIAGNOSIS — T502X5A Adverse effect of carbonic-anhydrase inhibitors, benzothiadiazides and other diuretics, initial encounter: Secondary | ICD-10-CM | POA: Diagnosis not present

## 2015-02-05 DIAGNOSIS — Z0000 Encounter for general adult medical examination without abnormal findings: Secondary | ICD-10-CM | POA: Diagnosis not present

## 2015-02-05 DIAGNOSIS — Z113 Encounter for screening for infections with a predominantly sexual mode of transmission: Secondary | ICD-10-CM | POA: Diagnosis not present

## 2015-02-05 DIAGNOSIS — N760 Acute vaginitis: Secondary | ICD-10-CM | POA: Diagnosis present

## 2015-02-05 DIAGNOSIS — Z01419 Encounter for gynecological examination (general) (routine) without abnormal findings: Secondary | ICD-10-CM | POA: Insufficient documentation

## 2015-02-05 DIAGNOSIS — I10 Essential (primary) hypertension: Secondary | ICD-10-CM | POA: Diagnosis not present

## 2015-02-05 DIAGNOSIS — Z124 Encounter for screening for malignant neoplasm of cervix: Secondary | ICD-10-CM

## 2015-02-05 LAB — TSH: TSH: 2.69 u[IU]/mL (ref 0.35–4.50)

## 2015-02-05 NOTE — Progress Notes (Signed)
Subjective:    Patient ID: Kathy [name], female    DOB: 1975-09-20, 39 y.o.   MRN: [id number]  HPI  Pt presents to the clinic today for her annual exam. She is also due for follow up of HTN.  Flu: 03/2014 Tetanus: She thinks it was in 2013 LMP: 01/09/2015 Pap Smear: 2014 Dentist: biannually  Diet: She consumes lean meats and fruits. She reports she could be better with veggies. She tries to avoid carbs. She drinks mostly water. Exercise: She has been working out at [employer] with a Systems analyst as well as seeing a nutritionist. She has lost 3 lbs over the last month.  HTN: BP well controlled on HCTZ. She is taking a Potassium supplement as well. Her BP today is 126/84. She has no ECG to review in the system.  Review of Systems      Past Medical History  Diagnosis Date  . Hypertension     Current Outpatient Prescriptions  Medication Sig Dispense Refill  . hydrochlorothiazide (HYDRODIURIL) 25 MG tablet TAKE 1 TABLET BY MOUTH DAILY. 30 tablet 0  . potassium chloride SA (K-DUR,KLOR-CON) 20 MEQ tablet Take 1 tablet (20 mEq total) by mouth 2 (two) times daily. 60 tablet 4   No current facility-administered medications for this visit.    No Known Allergies  Family History  Problem Relation Age of Onset  . Hypertension Mother   . Diabetes Maternal Aunt     Social History   Social History  . Marital Status: Single    Spouse Name: N/A  . Number of Children: N/A  . Years of Education: N/A   Occupational History  . Not on file.   Social History Main Topics  . Smoking status: Never Smoker   . Smokeless tobacco: Never Used  . Alcohol Use: Yes     Comment: rare  . Drug Use: No  . Sexual Activity: Yes    Birth Control/ Protection: Surgical   Other Topics Concern  . Not on file   Social History Narrative     Constitutional: Denies fever, malaise, fatigue, headache or abrupt weight changes.  HEENT: Denies eye pain, eye redness, ear pain, ringing in the  ears, wax buildup, runny nose, nasal congestion, bloody nose, or sore throat. Respiratory: Denies difficulty breathing, shortness of breath, cough or sputum production.   Cardiovascular: Pt reports swelling in her feet. Denies chest pain, chest tightness, palpitations or swelling in the hands.  Gastrointestinal: Denies abdominal pain, bloating, constipation, diarrhea or blood in the stool.  GU: Denies urgency, frequency, pain with urination, burning sensation, blood in urine, odor or discharge. Musculoskeletal: Denies decrease in range of motion, difficulty with gait, muscle pain or joint pain and swelling.  Skin: Denies redness, rashes, lesions or ulcercations.  Neurological: Denies dizziness, difficulty with memory, difficulty with speech or problems with balance and coordination.   No other specific complaints in a complete review of systems (except as listed in HPI above).  Objective:   Physical Exam   BP 126/84 mmHg  Pulse 60  Temp(Src) 98.9 F (37.2 C) (Oral)  Ht 5' 3.5" (1.613 m)  Wt 247 lb (112.038 kg)  BMI 43.06 kg/m2  SpO2 98%  LMP 01/09/2015 Wt Readings from Last 3 Encounters:  02/05/15 247 lb (112.038 kg)  12/25/14 249 lb 3.2 oz (113.036 kg)  12/21/14 250 lb 8 oz (113.626 kg)    [name]: Appears her, appears her stated age, obese in NAD. Skin: Warm, dry and intact. No rashes,  lesions or ulcerations noted. HEENT: Head: normal shape and size; Eyes: sclera white, no icterus, conjunctiva pink, PERRLA and EOMs intact; Ears: Tm's gray and intact, normal light reflex; Throat/Mouth: Teeth present, mucosa pink and moist, no exudate, lesions or ulcerations noted.  Neck: Neck supple, trachea midline. No masses, lumps or thyromegaly present.  Cardiovascular: Normal rate and rhythm. S1,S2 noted.  No murmur, rubs or gallops noted. Trace pitting BLE edema.  Pulmonary/Chest: Normal effort and positive vesicular breath sounds. No respiratory distress. No wheezes, rales or ronchi noted.    Abdomen: Soft and nontender. Normal bowel sounds, no bruits noted. No distention or masses noted. Liver, spleen and kidneys non palpable. Pelvic: Normal female anatomy. No CMT tenderness noted. Thick, chunky , white discharge noted in the vaginal area. No vaginal odor. Adnexa non palpable. Musculoskeletal: Normal range of motion. Strength 5/5 BUE/BLE. No difficulty with gait.  Neurological: Alert and oriented. Cranial nerves II-XII grossly intact. Coordination normal.  Psychiatric: Mood and affect normal. Behavior is normal. Judgment and thought content normal.     BMET    Component Value Date/Time   NA 138 01/29/2015 0934   K 3.7 01/29/2015 0934   CL 103 01/29/2015 0934   CO2 31 01/29/2015 0934   GLUCOSE 93 01/29/2015 0934   BUN 10 01/29/2015 0934   CREATININE 0.74 01/29/2015 0934   CREATININE 0.99 01/05/2014 1353   CALCIUM 9.3 01/29/2015 0934    Lipid Panel     Component Value Date/Time   CHOL 113 01/29/2015 0934   TRIG 108.0 01/29/2015 0934   HDL 34.70* 01/29/2015 0934   CHOLHDL 3 01/29/2015 0934   VLDL 21.6 01/29/2015 0934   LDLCALC 57 01/29/2015 0934    CBC    Component Value Date/Time   WBC 4.2 01/29/2015 0934   RBC 4.58 01/29/2015 0934   HGB 12.6 01/29/2015 0934   HCT 38.0 01/29/2015 0934   PLT 227.0 01/29/2015 0934   MCV 82.9 01/29/2015 0934   MCH 27.9 01/05/2014 1353   MCHC 33.1 01/29/2015 0934   RDW 16.9* 01/29/2015 0934    Hgb A1C Lab Results  Component Value Date   HGBA1C 6.0 01/29/2015        Assessment & Plan:   Preventative Health Maintenance:  Encouraged her to work on diet and exercise Advised her to get a flu shot in the fall 2016 Tetanus she thinks is UTD- will request records Pap Smear today- will send you a letter with the results Encouraged her to see a dentist at least annually CBC, CMET, Lipid and A1C from 8/23 reviewed Will check TSH, HIV and RPR today  RTC in 6 months to follow up HTN

## 2015-02-05 NOTE — Assessment & Plan Note (Signed)
Potassium level WNL Will continue Potassium supplement at this time

## 2015-02-05 NOTE — Patient Instructions (Signed)

## 2015-02-05 NOTE — Progress Notes (Signed)
Pre visit review using our clinic review tool, if applicable. No additional management support is needed unless otherwise documented below in the visit note. 

## 2015-02-05 NOTE — Assessment & Plan Note (Addendum)
BP well controlled on HCTZ and Potassium supplement CMET reviewed ECG today- some flipped t waves but nonspecific- essentially normal

## 2015-02-05 NOTE — Addendum Note (Signed)
Addended by: Roena Malady on: 02/05/2015 09:03 AM   Modules accepted: Orders

## 2015-02-06 ENCOUNTER — Ambulatory Visit: Payer: 59 | Admitting: Dietician

## 2015-02-06 LAB — RPR

## 2015-02-06 LAB — HIV ANTIBODY (ROUTINE TESTING W REFLEX): HIV 1&2 Ab, 4th Generation: NONREACTIVE

## 2015-02-07 LAB — CYTOLOGY - PAP

## 2015-02-08 ENCOUNTER — Other Ambulatory Visit: Payer: Self-pay | Admitting: Internal Medicine

## 2015-02-08 LAB — CERVICOVAGINAL ANCILLARY ONLY: Candida vaginitis: POSITIVE — AB

## 2015-02-08 MED ORDER — METRONIDAZOLE 0.75 % VA GEL: VAGINAL | Status: DC

## 2015-02-08 MED ORDER — FLUCONAZOLE 150 MG PO TABS: 150.0000 mg | ORAL_TABLET | Freq: Once | ORAL | Status: DC

## 2015-02-12 LAB — CERVICOVAGINAL ANCILLARY ONLY: HERPES (WINDOWPATH): NEGATIVE

## 2015-02-13 ENCOUNTER — Other Ambulatory Visit: Payer: Self-pay | Admitting: Internal Medicine

## 2015-02-13 MED ORDER — POTASSIUM CHLORIDE CRYS ER 20 MEQ PO TBCR: 20.0000 meq | EXTENDED_RELEASE_TABLET | Freq: Two times a day (BID) | ORAL | Status: DC

## 2015-02-18 ENCOUNTER — Encounter: Payer: Self-pay | Admitting: Dietician

## 2015-06-21 ENCOUNTER — Ambulatory Visit: Payer: Self-pay | Admitting: Physician Assistant

## 2015-06-21 ENCOUNTER — Encounter: Payer: Self-pay | Admitting: Physician Assistant

## 2015-06-21 VITALS — BP 132/90 | HR 72 | Temp 98.4°F

## 2015-06-21 DIAGNOSIS — R300 Dysuria: Secondary | ICD-10-CM

## 2015-06-21 DIAGNOSIS — N390 Urinary tract infection, site not specified: Secondary | ICD-10-CM

## 2015-06-21 LAB — POCT URINALYSIS DIPSTICK
Bilirubin, UA: NEGATIVE
GLUCOSE UA: NEGATIVE
Ketones, UA: NEGATIVE
LEUKOCYTES UA: NEGATIVE
NITRITE UA: POSITIVE
PROTEIN UA: NEGATIVE
SPEC GRAV UA: 1.02
UROBILINOGEN UA: 0.2
pH, UA: 7

## 2015-06-21 MED ORDER — NITROFURANTOIN MONOHYD MACRO 100 MG PO CAPS: 100.0000 mg | ORAL_CAPSULE | Freq: Two times a day (BID) | ORAL | Status: DC

## 2015-06-21 NOTE — Progress Notes (Signed)
S:  C/o uti sx for 2 days, burning, urgency, frequency, denies vaginal discharge, abdominal pain or flank pain:  Remainder ros neg  O:  Vitals wnl, nad, no cva tenderness, back nontender, lungs c t a,cv rrr, abd soft nontender, bs normal, n/v intact  A: uti  P: macrobid 100mg   bid x 7d, increase water intake, add cranberry juice, return if not improving in 2 -3 days, return earlier if worsening, discussed pyelonephritis sx

## 2015-07-17 ENCOUNTER — Ambulatory Visit (INDEPENDENT_AMBULATORY_CARE_PROVIDER_SITE_OTHER): Payer: 59 | Admitting: Internal Medicine

## 2015-07-17 ENCOUNTER — Encounter: Payer: Self-pay | Admitting: Internal Medicine

## 2015-07-17 ENCOUNTER — Telehealth: Payer: Self-pay

## 2015-07-17 VITALS — BP 124/78 | HR 64 | Temp 98.4°F | Wt 255.5 lb

## 2015-07-17 DIAGNOSIS — R609 Edema, unspecified: Secondary | ICD-10-CM | POA: Diagnosis not present

## 2015-07-17 MED ORDER — FUROSEMIDE 20 MG PO TABS: 20.0000 mg | ORAL_TABLET | Freq: Every day | ORAL | Status: DC

## 2015-07-17 NOTE — Progress Notes (Signed)
Pre visit review using our clinic review tool, if applicable. No additional management support is needed unless otherwise documented below in the visit note. 

## 2015-07-17 NOTE — Progress Notes (Signed)
Subjective:    Patient ID: Kathy [name], female    DOB: 11/17/1975, 40 y.o.   MRN: [id number]  HPI  Pt presents to the clinic today with c/o swelling in her legs and feet. This started 2 weeks ago. She works out 30 minutes every other day but has been unable to do this for the past 2 weeks due to swelling. She drinks 3L of water daily. 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 She has been taking Tylenol as needed for the pain in her feet/ankles. She denies chest pain, shortness of breath, palpitations or abdominal pain. She still takes HCTZ every day. She weighs 255.5lbs. Her last weight obtained in 01/2015 was 247lbs. Her weight has fluctuated in the past couple of years ranging from 247-262lbs.  Review of Systems      Past Medical History  Diagnosis Date  . Hypertension     Current Outpatient Prescriptions  Medication Sig Dispense Refill  . fluconazole (DIFLUCAN) 150 MG tablet Take 1 tablet (150 mg total) by mouth once. (Patient not taking: Reported on 06/21/2015) 1 tablet 0  . hydrochlorothiazide (HYDRODIURIL) 25 MG tablet TAKE 1 TABLET BY MOUTH DAILY. 30 tablet 5  . metroNIDAZOLE (METROGEL VAGINAL) 0.75 % vaginal gel Use 1 applicator twice daily x 5 days (Patient not taking: Reported on 06/21/2015) 70 g 0  . nitrofurantoin, macrocrystal-monohydrate, (MACROBID) 100 MG capsule Take 1 capsule (100 mg total) by mouth 2 (two) times daily. 14 capsule 0  . potassium chloride SA (K-DUR,KLOR-CON) 20 MEQ tablet Take 1 tablet (20 mEq total) by mouth 2 (two) times daily. 60 tablet 5   No current facility-administered medications for this visit.    No Known Allergies  Family History  Problem Relation Age of Onset  . Hypertension Mother   . Diabetes Maternal Aunt     Social History   Social History  . Marital Status: Single    Spouse Name: N/A  . Number of Children: N/A  . Years of Education: N/A   Occupational History  . Not on file.   Social History Main  Topics  . Smoking status: Never Smoker   . Smokeless tobacco: Never Used  . Alcohol Use: Yes     Comment: rare  . Drug Use: No  . Sexual Activity: Yes    Birth Control/ Protection: Surgical   Other Topics Concern  . Not on file   Social History Narrative     Respiratory: Denies cough, shortness of breath or sputum production.   Cardiovascular: Positive swelling in her lower extremities. Denies chest pain, chest tightness or palpitations. Gastrointestinal: Denies abdominal pain.  GU: Denies urgency, frequency, pain with urination, burning sensation, blood in urine, odor or discharge. Musculoskeletal: Denies decrease in range of motion or difficulty with gait. Neurological: Denies dizziness.   No other specific complaints in a complete review of systems (except as listed in HPI above).  Objective:   Physical Exam BP 124/78 mmHg  Pulse 64  Temp(Src) 98.4 F (36.9 C) (Oral)  Wt 255 lb 8 oz (115.894 kg)  LMP 07/04/2015 Wt Readings from Last 3 Encounters:  07/17/15 255 lb 8 oz (115.894 kg)  02/05/15 247 lb (112.038 kg)  12/25/14 249 lb 3.2 oz (113.036 kg)    [name]: Appears her stated age, obese in NAD. Skin: Warm, dry and intact. No rashes, lesions or ulcerations noted. Cardiovascular: Normal rate and rhythm. S1,S2 noted.  No murmur, rubs or gallops noted. No JVD.  Pitting edema in BLE starting below the knee, down into the feet. Dorsalis pedis pulses present B/L. Pulmonary/Chest: Normal effort and positive vesicular breath sounds. No respiratory distress. No wheezes, rales or ronchi noted.   BMET    Component Value Date/Time   NA 138 01/29/2015 0934   K 3.7 01/29/2015 0934   CL 103 01/29/2015 0934   CO2 31 01/29/2015 0934   GLUCOSE 93 01/29/2015 0934   BUN 10 01/29/2015 0934   CREATININE 0.74 01/29/2015 0934   CREATININE 0.99 01/05/2014 1353   CALCIUM 9.3 01/29/2015 0934    Lipid Panel     Component Value Date/Time   CHOL 113 01/29/2015 0934   TRIG 108.0  01/29/2015 0934   HDL 34.70* 01/29/2015 0934   CHOLHDL 3 01/29/2015 0934   VLDL 21.6 01/29/2015 0934   LDLCALC 57 01/29/2015 0934    CBC    Component Value Date/Time   WBC 4.2 01/29/2015 0934   RBC 4.58 01/29/2015 0934   HGB 12.6 01/29/2015 0934   HCT 38.0 01/29/2015 0934   PLT 227.0 01/29/2015 0934   MCV 82.9 01/29/2015 0934   MCH 27.9 01/05/2014 1353   MCHC 33.1 01/29/2015 0934   RDW 16.9* 01/29/2015 0934    Hgb A1C Lab Results  Component Value Date   HGBA1C 6.0 01/29/2015        Assessment & Plan:  Bilateral Lower Extremity Edema:  Order medium compression stockings to be worn when pt is on her feet for long periods of time Cut water intake from 3L daily to 2L daily  Lasix 20 mg daily for 3 days  Encouraged leg elevation  If symptoms persist, consider referral to vascular  RTC if as needed or if symptoms persist.

## 2015-07-17 NOTE — Telephone Encounter (Signed)
Pt left v/m; pt was seen earlier today and pt wants to know if she is to continue taking HCTZ while taking the Lasix. Pt request cb.

## 2015-07-17 NOTE — Patient Instructions (Signed)

## 2015-07-18 NOTE — Telephone Encounter (Signed)
Pt is aware as instructed 

## 2015-07-18 NOTE — Telephone Encounter (Signed)
Yes, take both together. Lasix is only for 3 days

## 2015-07-24 ENCOUNTER — Encounter: Payer: Self-pay | Admitting: Internal Medicine

## 2015-07-24 ENCOUNTER — Ambulatory Visit (INDEPENDENT_AMBULATORY_CARE_PROVIDER_SITE_OTHER): Payer: 59 | Admitting: Internal Medicine

## 2015-07-24 VITALS — BP 126/90 | HR 61 | Temp 98.9°F | Wt 250.0 lb

## 2015-07-24 DIAGNOSIS — R609 Edema, unspecified: Secondary | ICD-10-CM | POA: Diagnosis not present

## 2015-07-24 DIAGNOSIS — E876 Hypokalemia: Secondary | ICD-10-CM | POA: Diagnosis not present

## 2015-07-24 DIAGNOSIS — T502X5A Adverse effect of carbonic-anhydrase inhibitors, benzothiadiazides and other diuretics, initial encounter: Secondary | ICD-10-CM | POA: Diagnosis not present

## 2015-07-24 DIAGNOSIS — I10 Essential (primary) hypertension: Secondary | ICD-10-CM | POA: Diagnosis not present

## 2015-07-24 MED ORDER — FUROSEMIDE 20 MG PO TABS: 20.0000 mg | ORAL_TABLET | Freq: Every day | ORAL | Status: DC

## 2015-07-24 NOTE — Progress Notes (Signed)
Subjective:    Patient ID: Kathy [name], female    DOB: 1975-09-03, 40 y.o.   MRN: [id number]  HPI  Pt presents to the clinic today to discuss the Lasix she was put on last week for peripheral edema. She reports it seems more effective than the HCTZ. She would like to stay on this permanently, in addition to her HCTZ. She reports it has helped the swelling in her legs tremendously. She is also wearing compression hose during the day to keep the swelling down. Her BP today is 126/90. She denies chest pain, dizziness or shortness of breath.  Review of Systems      Past Medical History  Diagnosis Date  . Hypertension     Current Outpatient Prescriptions  Medication Sig Dispense Refill  . hydrochlorothiazide (HYDRODIURIL) 25 MG tablet TAKE 1 TABLET BY MOUTH DAILY. 30 tablet 5  . potassium chloride SA (K-DUR,KLOR-CON) 20 MEQ tablet Take 1 tablet (20 mEq total) by mouth 2 (two) times daily. 60 tablet 5  . furosemide (LASIX) 20 MG tablet Take 1 tablet (20 mg total) by mouth daily. (Patient not taking: Reported on 07/24/2015) 30 tablet 0   No current facility-administered medications for this visit.    No Known Allergies  Family History  Problem Relation Age of Onset  . Hypertension Mother   . Diabetes Maternal Aunt     Social History   Social History  . Marital Status: Single    Spouse Name: N/A  . Number of Children: N/A  . Years of Education: N/A   Occupational History  . Not on file.   Social History Main Topics  . Smoking status: Never Smoker   . Smokeless tobacco: Never Used  . Alcohol Use: Yes     Comment: rare  . Drug Use: No  . Sexual Activity: Yes    Birth Control/ Protection: Surgical   Other Topics Concern  . Not on file   Social History Narrative     Constitutional: Denies fever, malaise, fatigue, headache or abrupt weight changes.  Respiratory: Denies difficulty breathing, shortness of breath, cough or sputum production.   Cardiovascular:  Pt reports swelling in her legs. Denies chest pain, chest tightness, palpitations or swelling in the handst.  Neurological: Denies dizziness, difficulty with memory, difficulty with speech or problems with balance and coordination.    No other specific complaints in a complete review of systems (except as listed in HPI above).  Objective:   Physical Exam BP 126/90 mmHg  Pulse 61  Temp(Src) 98.9 F (37.2 C) (Oral)  Wt 250 lb (113.399 kg)  SpO2 99%  LMP 07/04/2015 Wt Readings from Last 3 Encounters:  07/24/15 250 lb (113.399 kg)  07/17/15 255 lb 8 oz (115.894 kg)  02/05/15 247 lb (112.038 kg)    [name]: Appears her stated age, obese in NAD. Cardiovascular: Normal rate and rhythm. S1,S2 noted.  No murmur, rubs or gallops noted. Trace pitting BLE edema.  Pulmonary/Chest: Normal effort and positive vesicular breath sounds. No respiratory distress. No wheezes, rales or ronchi noted.   BMET    Component Value Date/Time   NA 138 01/29/2015 0934   K 3.7 01/29/2015 0934   CL 103 01/29/2015 0934   CO2 31 01/29/2015 0934   GLUCOSE 93 01/29/2015 0934   BUN 10 01/29/2015 0934   CREATININE 0.74 01/29/2015 0934   CREATININE 0.99 01/05/2014 1353   CALCIUM 9.3 01/29/2015 0934    Lipid Panel     Component Value Date/Time  CHOL 113 01/29/2015 0934   TRIG 108.0 01/29/2015 0934   HDL 34.70* 01/29/2015 0934   CHOLHDL 3 01/29/2015 0934   VLDL 21.6 01/29/2015 0934   LDLCALC 57 01/29/2015 0934    CBC    Component Value Date/Time   WBC 4.2 01/29/2015 0934   RBC 4.58 01/29/2015 0934   HGB 12.6 01/29/2015 0934   HCT 38.0 01/29/2015 0934   PLT 227.0 01/29/2015 0934   MCV 82.9 01/29/2015 0934   MCH 27.9 01/05/2014 1353   MCHC 33.1 01/29/2015 0934   RDW 16.9* 01/29/2015 0934    Hgb A1C Lab Results  Component Value Date   HGBA1C 6.0 01/29/2015          Assessment & Plan:   HTN with peripheral edema:  I advised her that I do not want her to take HCTZ and Lasix together  long term, because I do not think her BP could support it and I am also concerned about the effect it can have on her kidneys Will d/c HCTZ Start Lasix 20 mg daily Check BMET in 2 weeks (history of diuretic induced hyperkalemia)- make lab only appt  RTC in 1 month to recheck BP

## 2015-07-24 NOTE — Progress Notes (Signed)
Pre visit review using our clinic review tool, if applicable. No additional management support is needed unless otherwise documented below in the visit note. 

## 2015-07-24 NOTE — Patient Instructions (Signed)
Furosemide tablets °What is this medicine? °FUROSEMIDE (fyoor [place] se mide) is a diuretic. It helps you make more urine and to lose salt and excess water from your body. This medicine is used to treat high blood pressure, and edema or swelling from heart, kidney, or liver disease. °This medicine may be used for other purposes; ask your health care provider or pharmacist if you have questions. °What should I tell my health care provider before I take this medicine? °They need to know if you have any of these conditions: °-abnormal blood electrolytes °-diarrhea or vomiting °-gout °-heart disease °-kidney disease, small amounts of urine, or difficulty passing urine °-liver disease °-thyroid disease °-an unusual or allergic reaction to furosemide, sulfa drugs, other medicines, foods, dyes, or preservatives °-pregnant or trying to get pregnant °-breast-feeding °How should I use this medicine? °Take this medicine by mouth with a glass of water. Follow the directions on the prescription label. You may take this medicine with or without food. If it upsets your stomach, take it with food or milk. Do not take your medicine more often than directed. Remember that you will need to pass more urine after taking this medicine. Do not take your medicine at a time of day that will cause you problems. Do not take at bedtime. °Talk to your pediatrician regarding the use of this medicine in children. While this drug may be prescribed for selected conditions, precautions do apply. °Overdosage: If you think you have taken too much of this medicine contact a poison control center or emergency room at once. °NOTE: This medicine is only for you. Do not share this medicine with others. °What if I miss a dose? °If you miss a dose, take it as soon as you can. If it is almost time for your next dose, take only that dose. Do not take double or extra doses. °What may interact with this medicine? °-aspirin and aspirin-like medicines °-certain  antibiotics °-chloral hydrate °-cisplatin °-cyclosporine °-digoxin °-diuretics °-laxatives °-lithium °-medicines for blood pressure °-medicines that relax muscles for surgery °-methotrexate °-NSAIDs, medicines for pain and inflammation like ibuprofen, naproxen, or indomethacin °-phenytoin °-steroid medicines like prednisone or cortisone °-sucralfate °-thyroid hormones °This list may not describe all possible interactions. Give your health care provider a list of all the medicines, herbs, non-prescription drugs, or dietary supplements you use. Also tell them if you smoke, drink alcohol, or use illegal drugs. Some items may interact with your medicine. °What should I watch for while using this medicine? °Visit your doctor or health care professional for regular checks on your progress. Check your blood pressure regularly. Ask your doctor or health care professional what your blood pressure should be, and when you should contact him or her. If you are a diabetic, check your blood sugar as directed. °You may need to be on a special diet while taking this medicine. Check with your doctor. Also, ask how many glasses of fluid you need to drink a day. You must not get dehydrated. °You may get drowsy or dizzy. Do not drive, use machinery, or do anything that needs mental alertness until you know how this drug affects you. Do not stand or sit up quickly, especially if you are an older patient. This reduces the risk of dizzy or fainting spells. Alcohol can make you more drowsy and dizzy. Avoid alcoholic drinks. °This medicine can make you more sensitive to the sun. Keep out of the sun. If you cannot avoid being in the sun, wear protective clothing and   use sunscreen. Do not use sun lamps or tanning beds/booths. °What side effects may I notice from receiving this medicine? °Side effects that you should report to your doctor or health care professional as soon as possible: °-blood in urine or stools °-dry mouth °-fever or  chills °-hearing loss or ringing in the ears °-irregular heartbeat °-muscle pain or weakness, cramps °-skin rash °-stomach upset, pain, or nausea °-tingling or numbness in the hands or feet °-unusually weak or tired °-vomiting or diarrhea °-yellowing of the eyes or skin °Side effects that usually do not require medical attention (report to your doctor or health care professional if they continue or are bothersome): °-headache °-loss of appetite °-unusual bleeding or bruising °This list may not describe all possible side effects. Call your doctor for medical advice about side effects. You may report side effects to FDA at 1-800-FDA-1088. °Where should I keep my medicine? °Keep out of the reach of children. °Store at room temperature between 15 and 30 degrees C (59 and 86 degrees F). Protect from light. Throw away any unused medicine after the expiration date. °NOTE: This sheet is a summary. It may not cover all possible information. If you have questions about this medicine, talk to your doctor, pharmacist, or health care provider. °  °© 2016, [employer]. (2014-08-15 13:49:50) ° °

## 2015-08-07 ENCOUNTER — Other Ambulatory Visit (INDEPENDENT_AMBULATORY_CARE_PROVIDER_SITE_OTHER): Payer: 59

## 2015-08-07 DIAGNOSIS — I10 Essential (primary) hypertension: Secondary | ICD-10-CM | POA: Diagnosis not present

## 2015-08-07 DIAGNOSIS — T502X5A Adverse effect of carbonic-anhydrase inhibitors, benzothiadiazides and other diuretics, initial encounter: Secondary | ICD-10-CM

## 2015-08-07 DIAGNOSIS — R609 Edema, unspecified: Secondary | ICD-10-CM | POA: Diagnosis not present

## 2015-08-07 DIAGNOSIS — E876 Hypokalemia: Secondary | ICD-10-CM

## 2015-08-07 LAB — BASIC METABOLIC PANEL
BUN: 8 mg/dL (ref 6–23)
CALCIUM: 9 mg/dL (ref 8.4–10.5)
CHLORIDE: 104 meq/L (ref 96–112)
CO2: 26 meq/L (ref 19–32)
CREATININE: 0.75 mg/dL (ref 0.40–1.20)
GFR: 111.26 mL/min (ref >60.00)
Glucose, Bld: 82 mg/dL (ref 70–99)
Potassium: 3.8 mEq/L (ref 3.5–5.1)
Sodium: 138 mEq/L (ref 135–145)

## 2015-09-19 ENCOUNTER — Ambulatory Visit (INDEPENDENT_AMBULATORY_CARE_PROVIDER_SITE_OTHER): Payer: 59 | Admitting: Internal Medicine

## 2015-09-19 ENCOUNTER — Encounter: Payer: Self-pay | Admitting: Internal Medicine

## 2015-09-19 ENCOUNTER — Other Ambulatory Visit: Payer: Self-pay | Admitting: Internal Medicine

## 2015-09-19 VITALS — BP 132/76 | HR 64 | Temp 98.1°F | Wt 255.0 lb

## 2015-09-19 DIAGNOSIS — B3731 Acute candidiasis of vulva and vagina: Secondary | ICD-10-CM

## 2015-09-19 DIAGNOSIS — N898 Other specified noninflammatory disorders of vagina: Secondary | ICD-10-CM | POA: Diagnosis not present

## 2015-09-19 DIAGNOSIS — N9489 Other specified conditions associated with female genital organs and menstrual cycle: Secondary | ICD-10-CM

## 2015-09-19 DIAGNOSIS — B373 Candidiasis of vulva and vagina: Secondary | ICD-10-CM

## 2015-09-19 MED ORDER — FLUCONAZOLE 150 MG PO TABS: 150.0000 mg | ORAL_TABLET | Freq: Once | ORAL | Status: DC

## 2015-09-19 NOTE — Progress Notes (Signed)
Pre visit review using our clinic review tool, if applicable. No additional management support is needed unless otherwise documented below in the visit note. 

## 2015-09-19 NOTE — Progress Notes (Signed)
Subjective:    Patient ID: Kathy [name], female    DOB: 1976/04/27, 40 y.o.   MRN: [id number]  HPI  Pt presents to the clinic today with c/o vaginal odor and discharge. She noticed this 3 days ago. She describes the discharge as thick and white. There is some vaginal itching. She denies abnormal bleeding. She has tried OTC yeast cream with minimal relief. She is sexually active with 1 partner, they occasionally use condoms. She would like to be tested for STD's. She does not douche or take bubble baths. She does use scented soap.  Review of Systems      Past Medical History  Diagnosis Date  . Hypertension     Current Outpatient Prescriptions  Medication Sig Dispense Refill  . furosemide (LASIX) 20 MG tablet Take 1 tablet (20 mg total) by mouth daily. 30 tablet 2  . potassium chloride SA (K-DUR,KLOR-CON) 20 MEQ tablet Take 1 tablet (20 mEq total) by mouth 2 (two) times daily. 60 tablet 5  . fluconazole (DIFLUCAN) 150 MG tablet Take 1 tablet (150 mg total) by mouth once. 2 tablet 0   No current facility-administered medications for this visit.    No Known Allergies  Family History  Problem Relation Age of Onset  . Hypertension Mother   . Diabetes Maternal Aunt     Social History   Social History  . Marital Status: Single    Spouse Name: N/A  . Number of Children: N/A  . Years of Education: N/A   Occupational History  . Not on file.   Social History Main Topics  . Smoking status: Never Smoker   . Smokeless tobacco: Never Used  . Alcohol Use: 0.0 oz/week    0 Standard drinks or equivalent per week     Comment: rare  . Drug Use: No  . Sexual Activity: Yes    Birth Control/ Protection: Surgical   Other Topics Concern  . Not on file   Social History Narrative     Constitutional: Denies fever, malaise, fatigue, headache or abrupt weight changes.  Respiratory: Denies difficulty breathing, shortness of breath, cough or sputum production.     Cardiovascular: Denies chest pain, chest tightness, palpitations or swelling in the hands or feet.  Gastrointestinal: Denies abdominal pain, bloating, constipation, diarrhea or blood in the stool.  GU: Pt reports vaginal discharge and odor. Denies urgency, frequency, pain with urination, burning sensation, blood in urine.   No other specific complaints in a complete review of systems (except as listed in HPI above).  Objective:   Physical Exam  BP 132/76 mmHg  Pulse 64  Temp(Src) 98.1 F (36.7 C) (Oral)  Wt 255 lb (115.667 kg)  SpO2 98%  LMP 08/26/2015 Wt Readings from Last 3 Encounters:  09/19/15 255 lb (115.667 kg)  07/24/15 250 lb (113.399 kg)  07/17/15 255 lb 8 oz (115.894 kg)    General: Appears her stated age, obese in NAD. Cardiovascular: Normal rate and rhythm. S1,S2 noted.  No murmur, rubs or gallops noted.  Pulmonary/Chest: Normal effort and positive vesicular breath sounds. No respiratory distress. No wheezes, rales or ronchi noted.  Abdomen: Soft and nontender.  Pelvic: Normal female anatomy. Thick, clumpy , white discharge noted in the vaginal vault. Discharge with notable odor. No CMT.  BMET    Component Value Date/Time   NA 138 08/07/2015 1018   K 3.8 08/07/2015 1018   CL 104 08/07/2015 1018   CO2 26 08/07/2015 1018   GLUCOSE 82 08/07/2015  1018   BUN 8 08/07/2015 1018   CREATININE 0.75 08/07/2015 1018   CREATININE 0.99 01/05/2014 1353   CALCIUM 9.0 08/07/2015 1018    Lipid Panel     Component Value Date/Time   CHOL 113 01/29/2015 0934   TRIG 108.0 01/29/2015 0934   HDL 34.70* 01/29/2015 0934   CHOLHDL 3 01/29/2015 0934   VLDL 21.6 01/29/2015 0934   LDLCALC 57 01/29/2015 0934    CBC    Component Value Date/Time   WBC 4.2 01/29/2015 0934   RBC 4.58 01/29/2015 0934   HGB 12.6 01/29/2015 0934   HCT 38.0 01/29/2015 0934   PLT 227.0 01/29/2015 0934   MCV 82.9 01/29/2015 0934   MCH 27.9 01/05/2014 1353   MCHC 33.1 01/29/2015 0934   RDW 16.9*  01/29/2015 0934    Hgb A1C Lab Results  Component Value Date   HGBA1C 6.0 01/29/2015         Assessment & Plan:   Vaginal discharge secondary to candida vaginitis:  Wet prep: +yeast Will send wet prep off to be read by lab Will send culture for G/C eRx for Diflucan 150 mg PO today, repeat in 3 days Advised her to start using unscented soap for sensitive skin  RTC as needed or if symptoms persist or worsen

## 2015-09-19 NOTE — Patient Instructions (Signed)

## 2015-09-20 LAB — WET PREP BY MOLECULAR PROBE
Candida species: NEGATIVE
GARDNERELLA VAGINALIS: NEGATIVE
Trichomonas vaginosis: NEGATIVE

## 2015-09-20 LAB — GC/CHLAMYDIA PROBE AMP
CT PROBE, AMP APTIMA: NOT DETECTED
GC Probe RNA: NOT DETECTED

## 2015-10-02 DIAGNOSIS — H5213 Myopia, bilateral: Secondary | ICD-10-CM | POA: Diagnosis not present

## 2015-10-11 ENCOUNTER — Telehealth: Payer: Self-pay | Admitting: Internal Medicine

## 2015-10-11 NOTE — Telephone Encounter (Signed)
Pt requesting cb regarding potassium levels  cb number is [phone] Thanks

## 2015-10-21 ENCOUNTER — Telehealth: Payer: Self-pay | Admitting: Internal Medicine

## 2015-10-21 NOTE — Telephone Encounter (Signed)
TH scheduled appt on 10/23/15 at 9:30 with Mayra ReelKate Clark NP.

## 2015-10-21 NOTE — Telephone Encounter (Signed)
[facility] Client TELEPHONE ADVICE RECORD   [facility]     Patient Name: Kathy BoehringerJACQUELINE Berkland Initial Comment Caller takes two potassium pills and one of them is making her dehydrated   DOB: Jan 06, 1976      Nurse Assessment  Nurse: Tera Materowan, RN, Elnita Maxwellheryl Date/Time (Eastern Time): 10/21/2015 11:00:13 AM  Confirm and document reason for call. If symptomatic, describe symptoms. You must click the next button to save text entered. ---Caller states that she is is taking potassium bid for the last 3 months and she believes that it is causing her be thirsty and cause dry mouth. States that she is dehydrated but pt is drinking 2 L per day and is voiding.  Has the patient traveled out of the country within the last 30 days? ---Not Applicable  Does the patient have any new or worsening symptoms? ---Yes  Will a triage be completed? ---Yes  Related visit to physician within the last 2 weeks? ---No  Does the PT have any chronic conditions? (i.e. diabetes, asthma, etc.) ---Yes  List chronic conditions. ---htn  Is the patient pregnant or possibly pregnant? (Ask all females between the ages of 6412-55) ---No  Is this a behavioral health or substance abuse call? ---No    Guidelines     Guideline Title Affirmed Question Affirmed Notes   Mouth Symptoms [1] Dry mouth AND [2] new onset AND [3] unexplained (Exceptions: recurrent ongoing problem or dry mouth from mild dehydration)    Final Disposition User   See PCP When Office is Open (within 3 days) Tera Materowan, RN, [employer]heryl     Referrals   REFERRED TO PCP OFFICE   Disagree/Comply: Danella Maiersomply

## 2015-10-22 NOTE — Telephone Encounter (Signed)
Noted  

## 2015-10-23 ENCOUNTER — Ambulatory Visit (INDEPENDENT_AMBULATORY_CARE_PROVIDER_SITE_OTHER): Payer: 59 | Admitting: Primary Care

## 2015-10-23 ENCOUNTER — Encounter: Payer: Self-pay | Admitting: Primary Care

## 2015-10-23 VITALS — BP 124/82 | HR 61 | Temp 98.1°F | Wt 261.0 lb

## 2015-10-23 DIAGNOSIS — R631 Polydipsia: Secondary | ICD-10-CM

## 2015-10-23 LAB — COMPREHENSIVE METABOLIC PANEL
ALK PHOS: 76 U/L (ref 39–117)
ALT: 10 U/L (ref 0–35)
AST: 12 U/L (ref 0–37)
Albumin: 3.5 g/dL (ref 3.5–5.2)
BUN: 7 mg/dL (ref 6–23)
CHLORIDE: 102 meq/L (ref 96–112)
CO2: 26 mEq/L (ref 19–32)
Calcium: 8.6 mg/dL (ref 8.4–10.5)
Creatinine, Ser: 0.71 mg/dL (ref 0.40–1.20)
GFR: 118.39 mL/min (ref >60.00)
GLUCOSE: 84 mg/dL (ref 70–99)
POTASSIUM: 3.3 meq/L — AB (ref 3.5–5.1)
SODIUM: 136 meq/L (ref 135–145)
Total Bilirubin: 0.8 mg/dL (ref 0.2–1.2)
Total Protein: 7.6 g/dL (ref 6.0–8.3)

## 2015-10-23 LAB — CBC
HEMATOCRIT: 36.1 % (ref 36.0–46.0)
HEMOGLOBIN: 11.8 g/dL — AB (ref 12.0–15.0)
MCHC: 32.6 g/dL (ref 30.0–36.0)
MCV: 78.7 fl (ref 78.0–100.0)
Platelets: 256 10*3/uL (ref 150.0–400.0)
RBC: 4.59 Mil/uL (ref 3.87–5.11)
RDW: 18.8 % — AB (ref 11.5–15.5)
WBC: 4.8 10*3/uL (ref 4.0–10.5)

## 2015-10-23 LAB — LIPID PANEL
CHOL/HDL RATIO: 4
Cholesterol: 125 mg/dL (ref 0–200)
HDL: 34.5 mg/dL — AB (ref >39.00)
LDL CALC: 70 mg/dL (ref 0–99)
NONHDL: 90.09
TRIGLYCERIDES: 101 mg/dL (ref 0.0–149.0)
VLDL: 20.2 mg/dL (ref 0.0–40.0)

## 2015-10-23 LAB — HEMOGLOBIN A1C: Hgb A1c MFr Bld: 6.4 % (ref 4.6–6.5)

## 2015-10-23 LAB — TSH: TSH: 2.73 u[IU]/mL (ref 0.35–4.50)

## 2015-10-23 NOTE — Progress Notes (Signed)
Subjective:    Patient ID: Kathy [name], female    DOB: 04-Mar-1976, 40 y.o.   MRN: [id number]  HPI  Ms. Ariel is a 40 year old female with a history of obesity and hypertension who presents today with a chief compliant of polydipsia. She also reports extreme dry mouth. Her symptoms have been present for the past 1 week. She has a family history of diabetes in her mother. Denies numbness/tingling, dizziness, chest pain, visual changes, changes in skin color, urinary symptoms, recent cough. Her last eye exam was 3 weeks ago and was normal. She's not had her blood sugar checked recently. She has regular periods.  She endorses a healthy diet which consists of: Breakfast: Oatmeal, eggs Lunch: Grilled chicken sandwich, salad Snack: Peanut butter crackers, fruit Dinner: Fast food Snacks: Sandwich Desserts: Eats three times weekly. Beverages: Water, juice  Exercise: She does not currently exercise.   Review of Systems  Constitutional: Negative for unexpected weight change.  HENT: Negative for sore throat.   Respiratory: Negative for cough.   Gastrointestinal: Negative for abdominal pain.  Endocrine: Positive for polydipsia and polyuria.  Genitourinary: Negative for dysuria, hematuria, vaginal discharge and difficulty urinating.       Regular periods  Neurological: Negative for dizziness and headaches.       Past Medical History  Diagnosis Date  . Hypertension      Social History   Social History  . Marital Status: Single    Spouse Name: N/A  . Number of Children: N/A  . Years of Education: N/A   Occupational History  . Not on file.   Social History Main Topics  . Smoking status: Never Smoker   . Smokeless tobacco: Never Used  . Alcohol Use: 0.0 oz/week    0 Standard drinks or equivalent per week     Comment: rare  . Drug Use: No  . Sexual Activity: Yes    Birth Control/ Protection: Surgical   Other Topics Concern  . Not on file   Social History  Narrative    Past Surgical History  Procedure Laterality Date  . Wisdom tooth extraction      Family History  Problem Relation Age of Onset  . Hypertension Mother   . Diabetes Maternal Aunt     No Known Allergies  Current Outpatient Prescriptions on File Prior to Visit  Medication Sig Dispense Refill  . furosemide (LASIX) 20 MG tablet Take 1 tablet (20 mg total) by mouth daily. 30 tablet 2  . potassium chloride SA (K-DUR,KLOR-CON) 20 MEQ tablet Take 1 tablet (20 mEq total) by mouth 2 (two) times daily. 60 tablet 5   No current facility-administered medications on file prior to visit.    BP 124/82 mmHg  Pulse 61  Temp(Src) 98.1 F (36.7 C) (Oral)  Wt 261 lb (118.389 kg)  SpO2 99%  LMP 10/02/2015    Objective:   Physical Exam  Constitutional: She appears well-nourished.  HENT:  Nose: Nose normal.  Mouth/Throat: Oropharynx is clear and moist. No oropharyngeal exudate.  Neck: Neck supple.  Cardiovascular: Normal rate and regular rhythm.   Pulmonary/Chest: Effort normal and breath sounds normal.  Skin: Skin is warm and dry.  No presence of acanthosis nigricans.           Assessment & Plan:  Polydipsia:  Also with polyuria and dry mouth x 1 week. FH of diabetes in mother.  Poor diet, does not exercise, and is obese. Given high risk for diabetes, will check  A1C and other labs to rule out any metabolic cause. Exam without evidence of metabolic syndrome. Oral cavity WNL. Discussed importance of weight loss through healthy diet and exercise. Labs pending.

## 2015-10-23 NOTE — Progress Notes (Signed)
Pre visit review using our clinic review tool, if applicable. No additional management support is needed unless otherwise documented below in the visit note. 

## 2015-10-23 NOTE — Patient Instructions (Signed)
Complete lab work prior to leaving today. I will notify you of your results once received.   It was a pleasure meeting you!  

## 2015-10-25 ENCOUNTER — Other Ambulatory Visit: Payer: Self-pay | Admitting: Internal Medicine

## 2015-10-26 NOTE — Telephone Encounter (Signed)
Pt recently saw Kate---please ad,vise if okay to refill

## 2015-10-28 NOTE — Telephone Encounter (Signed)
Medication sent electronically 

## 2016-02-14 ENCOUNTER — Other Ambulatory Visit: Payer: Self-pay | Admitting: Internal Medicine

## 2016-02-26 ENCOUNTER — Ambulatory Visit (INDEPENDENT_AMBULATORY_CARE_PROVIDER_SITE_OTHER): Payer: 59 | Admitting: Internal Medicine

## 2016-02-26 ENCOUNTER — Encounter: Payer: Self-pay | Admitting: Internal Medicine

## 2016-02-26 VITALS — BP 136/80 | HR 59 | Temp 98.6°F | Wt 267.0 lb

## 2016-02-26 DIAGNOSIS — M25571 Pain in right ankle and joints of right foot: Secondary | ICD-10-CM | POA: Diagnosis not present

## 2016-02-26 DIAGNOSIS — T502X5A Adverse effect of carbonic-anhydrase inhibitors, benzothiadiazides and other diuretics, initial encounter: Secondary | ICD-10-CM

## 2016-02-26 DIAGNOSIS — M25471 Effusion, right ankle: Secondary | ICD-10-CM

## 2016-02-26 DIAGNOSIS — R600 Localized edema: Secondary | ICD-10-CM | POA: Diagnosis not present

## 2016-02-26 DIAGNOSIS — E876 Hypokalemia: Secondary | ICD-10-CM

## 2016-02-26 LAB — BASIC METABOLIC PANEL
BUN: 6 mg/dL (ref 6–23)
CALCIUM: 8.3 mg/dL — AB (ref 8.4–10.5)
CHLORIDE: 104 meq/L (ref 96–112)
CO2: 28 mEq/L (ref 19–32)
CREATININE: 0.79 mg/dL (ref 0.40–1.20)
GFR: 104.47 mL/min (ref >60.00)
Glucose, Bld: 133 mg/dL — ABNORMAL HIGH (ref 70–99)
Potassium: 3.2 mEq/L — ABNORMAL LOW (ref 3.5–5.1)
Sodium: 138 mEq/L (ref 135–145)

## 2016-02-26 NOTE — Patient Instructions (Signed)
Generic Ankle Exercises EXERCISES RANGE OF MOTION (ROM) AND STRETCHING EXERCISES These exercises may help you when beginning to rehabilitate your injury. Your symptoms may resolve with or without further involvement from your physician, physical therapist or athletic trainer. While completing these exercises, remember:   Restoring tissue flexibility helps normal motion to return to the joints. This allows healthier, less painful movement and activity.  An effective stretch should be held for at least 30 seconds.  A stretch should never be painful. You should only feel a gentle lengthening or release in the stretched tissue. RANGE OF MOTION - Dorsi/Plantar Flexion  While sitting with your right / left knee straight, draw the top of your foot upwards by flexing your ankle. Then reverse the motion, pointing your toes downward.  Hold each position for __________ seconds.  After completing your first set of exercises, repeat this exercise with your knee bent. Repeat __________ times. Complete this exercise __________ times per day.  RANGE OF MOTION - Ankle Alphabet  Imagine your right / left big toe is a pen.  Keeping your hip and knee still, write out the entire alphabet with your "pen." Make the letters as large as you can without increasing any discomfort. Repeat __________ times. Complete this exercise __________ times per day.  RANGE OF MOTION - Ankle Dorsiflexion, Active Assisted   Remove shoes and sit on a chair that is preferably not on a carpeted surface.  Place right / left foot under knee. Extend your opposite leg for support.  Keeping your heel down, slide your right / left foot back toward the chair until you feel a stretch at your ankle or calf. If you do not feel a stretch, slide your bottom forward to the edge of the chair while still keeping your heel down.  Hold this stretch for __________ seconds. Repeat __________ times. Complete this stretch __________ times per day.    STRENGTHENING EXERCISES  These exercises may help you when beginning to rehabilitate your injury. They may resolve your symptoms with or without further involvement from your physician, physical therapist or athletic trainer. While completing these exercises, remember:   Muscles can gain both the endurance and the strength needed for everyday activities through controlled exercises.  Complete these exercises as instructed by your physician, physical therapist or athletic trainer. Progress the resistance and repetitions only as guided.  You may experience muscle soreness or fatigue, but the pain or discomfort you are trying to eliminate should never worsen during these exercises. If this pain does worsen, stop and make certain you are following the directions exactly. If the pain is still present after adjustments, discontinue the exercise until you can discuss the trouble with your clinician. STRENGTH - Dorsiflexors  Secure a rubber exercise band/tubing to a fixed object (table, pole) and loop the other end around your right / left foot.  Sit on the floor facing the fixed object. The band/tubing should be slightly tense when your foot is relaxed.  Slowly draw your foot back toward you using your ankle and toes.  Hold this position for __________ seconds. Slowly release the tension in the band and return your foot to the starting position. Repeat __________ times. Complete this exercise __________ times per day.  STRENGTH - Plantar-flexors  Sit with your right / left leg extended. Holding onto both ends of a rubber exercise band/tubing, loop it around the ball of your foot. Keep a slight tension in the band.  Slowly push your toes away from you, pointing   them downward.  Hold this position for __________ seconds. Return slowly, controlling the tension in the band/tubing. Repeat __________ times. Complete this exercise __________ times per day.  STRENGTH - Ankle Eversion  Secure one end of  a rubber exercise band/tubing to a fixed object (table, pole). Loop the other end around your foot just before your toes.  Place your fists between your knees. This will focus your strengthening at your ankle.  Drawing the band/tubing across your opposite foot, slowly, pull your little toe out and up. Make sure the band/tubing is positioned to resist the entire motion.  Hold this position for __________ seconds.  Have your muscles resist the band/tubing as it slowly pulls your foot back to the starting position. Repeat __________ times. Complete this exercise __________ times per day.  STRENGTH - Ankle Inversion  Secure one end of a rubber exercise band/tubing to a fixed object (table, pole). Loop the other end around your foot just before your toes.  Place your fists between your knees. This will focus your strengthening at your ankle.  Slowly, pull your big toe up and in, making sure the band/tubing is positioned to resist the entire motion.  Hold this position for __________ seconds.  Have your muscles resist the band/tubing as it slowly pulls your foot back to the starting position. Repeat __________ times. Complete this exercises __________ times per day.  STRENGTH - Towel Curls  Sit in a chair positioned on a non-carpeted surface.  Place your foot on a towel, keeping your heel on the floor.  Pull the towel toward your heel by only curling your toes. Keep your heel on the floor. If instructed by your physician, physical therapist or athletic trainer, add weight to the end of the towel. Repeat __________ times. Complete this exercise __________ times per day. STRENGTH - Plantar-flexors, Standing  Stand with your feet shoulder width apart. Steady yourself with a wall or table using as little support as needed.  Keeping your weight evenly spread over the width of your feet, rise up on your toes.*  Hold this position for __________ seconds. Repeat __________ times. Complete  this exercise __________ times per day.  *If this is too easy, shift your weight toward your right / left leg until you feel challenged. Ultimately, you may be asked to do this exercise with your right / left foot only. BALANCE - Tandem Walking  Place your uninjured foot on a line 2-4 inches wide and at least 10 feet long.  Keeping your balance without using anything for extra support, place your right / left heel directly in front of your other foot.  Slowly raise your back foot up, lifting from the heel to the toes, and place it directly in front of the right / left foot.  Continue to walk along the line slowly. Walk for ____________________ feet. Repeat ____________________ times. Complete ____________________ times per day.   This information is not intended to replace advice given to you by your health care provider. Make sure you discuss any questions you have with your health care provider.   Document Released: 04/08/2005 Document Revised: 06/15/2014 Document Reviewed: 09/06/2008 Elsevier Interactive Patient Education 2016 [employer]  

## 2016-02-26 NOTE — Progress Notes (Signed)
Subjective:    Patient ID: Kathy [name], female    DOB: 08-08-1975, 40 y.o.   MRN: [id number]  HPI  Pt presents to the clinic today with c/o swelling and pain of the right ankle. This started about 1-2 months ago. She describes the pain as achy at times, and sharp at other times. The pain is intermittent. It radiateds to the arch of her right foot. The pain seems worse with sitting and better with standing. She denies any injury to the area. She has tried Aleve with good relief.  She also reports ongoing BLE edema. She was switched from HCTZ to Lasix secondary to ineffectiveness. She reports she has not really noticed any improvement with the Lasix. She is taking Potassium because the diuretic cause her to be hypokalemic. She reports she also wear compression hose during the day.  Review of Systems      Past Medical History:  Diagnosis Date  . Hypertension     Current Outpatient Prescriptions  Medication Sig Dispense Refill  . furosemide (LASIX) 20 MG tablet TAKE 1 TABLET BY MOUTH DAILY. 30 tablet 2  . potassium chloride SA (K-DUR,KLOR-CON) 20 MEQ tablet TAKE 1 TABLET BY MOUTH TWICE DAILY 60 tablet 5   No current facility-administered medications for this visit.     No Known Allergies  Family History  Problem Relation Age of Onset  . Hypertension Mother   . Diabetes Maternal Aunt     Social History   Social History  . Marital status: Single    Spouse name: N/A  . Number of children: N/A  . Years of education: N/A   Occupational History  . Not on file.   Social History Main Topics  . Smoking status: Never Smoker  . Smokeless tobacco: Never Used  . Alcohol use 0.0 oz/week     Comment: rare  . Drug use: No  . Sexual activity: Yes    Birth control/ protection: Surgical   Other Topics Concern  . Not on file   Social History Narrative  . No narrative on file     Constitutional: Denies fever, malaise, fatigue, headache or abrupt weight changes.    Respiratory: Denies difficulty breathing, shortness of breath, cough or sputum production.   Cardiovascular: Pt reports BLE edema. Denies chest pain, chest tightness, palpitations or swelling in the hands.  Musculoskeletal: Pt reports ankle swelling and pain. Denies decrease in range of motion, difficulty with gait, muscle pain.  Neurological: Denies problems with balance and coordination.    No other specific complaints in a complete review of systems (except as listed in HPI above).  Objective:   Physical Exam  BP 136/80   Pulse (!) 59   Temp 98.6 F (37 C) (Oral)   Wt 267 lb (121.1 kg)   LMP 02/25/2016   SpO2 98%   BMI 46.56 kg/m  Wt Readings from Last 3 Encounters:  02/26/16 267 lb (121.1 kg)  10/23/15 261 lb (118.4 kg)  09/19/15 255 lb (115.7 kg)    [name]: Appears her stated age, obese in NAD. Skin: Warm, dry and intact. No redness or warmth of right ankle. Cardiovascular: Normal rate and rhythm. S1,S2 noted.  No murmur, rubs or gallops noted. 2+ pitting BLE edema noted Pulmonary/Chest: Normal effort and positive vesicular breath sounds. No respiratory distress. No wheezes, rales or ronchi noted.  Musculoskeletal: Normal flexion, extension and rotation of the right ankle. Pain with palpation of the Achilles tendon and under the arch. No difficulty with  gait.  Neurological: Alert and oriented. Sensation intact to BLE.  BMET    Component Value Date/Time   NA 136 10/23/2015 1017   K 3.3 (L) 10/23/2015 1017   CL 102 10/23/2015 1017   CO2 26 10/23/2015 1017   GLUCOSE 84 10/23/2015 1017   BUN 7 10/23/2015 1017   CREATININE 0.71 10/23/2015 1017   CREATININE 0.99 01/05/2014 1353   CALCIUM 8.6 10/23/2015 1017    Lipid Panel     Component Value Date/Time   CHOL 125 10/23/2015 1017   TRIG 101.0 10/23/2015 1017   HDL 34.50 (L) 10/23/2015 1017   CHOLHDL 4 10/23/2015 1017   VLDL 20.2 10/23/2015 1017   LDLCALC 70 10/23/2015 1017    CBC    Component Value  Date/Time   WBC 4.8 10/23/2015 1017   RBC 4.59 10/23/2015 1017   HGB 11.8 (L) 10/23/2015 1017   HCT 36.1 10/23/2015 1017   PLT 256.0 10/23/2015 1017   MCV 78.7 10/23/2015 1017   MCH 27.9 01/05/2014 1353   MCHC 32.6 10/23/2015 1017   RDW 18.8 (H) 10/23/2015 1017    Hgb A1C Lab Results  Component Value Date   HGBA1C 6.4 10/23/2015       Assessment & Plan:   Right ankle swelling and pain:  ? Achilles tendonitis She declines xray to assess for bony abnormality Ankles exercises given Advised her to try wearing a Neoprene ankle sleeve while up and walking Continue Aleve or Ibuprofen OTC  BLE edema:  BMET today May need to increase Lasix and Potassium supplement  Will follow up after labs, RTC as needed. Nicki ReaperBAITY, REGINA, NP

## 2016-02-28 ENCOUNTER — Telehealth: Payer: Self-pay | Admitting: Internal Medicine

## 2016-02-28 NOTE — Telephone Encounter (Signed)
Patient called to get her lab results. 

## 2016-02-28 NOTE — Telephone Encounter (Signed)
This Clinical research associatewriter called patient and lm on cell VM (per DPR) to return call as soon as able to discuss lab results.

## 2016-04-03 ENCOUNTER — Other Ambulatory Visit: Payer: Self-pay | Admitting: Internal Medicine

## 2016-04-03 MED ORDER — POTASSIUM CHLORIDE CRYS ER 20 MEQ PO TBCR: 20.0000 meq | EXTENDED_RELEASE_TABLET | Freq: Two times a day (BID) | ORAL | 2 refills | Status: DC

## 2016-04-08 ENCOUNTER — Ambulatory Visit (INDEPENDENT_AMBULATORY_CARE_PROVIDER_SITE_OTHER): Payer: 59 | Admitting: Internal Medicine

## 2016-04-08 ENCOUNTER — Encounter: Payer: Self-pay | Admitting: Internal Medicine

## 2016-04-08 VITALS — BP 136/84 | HR 76 | Temp 98.1°F | Ht 63.25 in | Wt 266.8 lb

## 2016-04-08 DIAGNOSIS — Z23 Encounter for immunization: Secondary | ICD-10-CM

## 2016-04-08 DIAGNOSIS — E119 Type 2 diabetes mellitus without complications: Secondary | ICD-10-CM | POA: Insufficient documentation

## 2016-04-08 DIAGNOSIS — I10 Essential (primary) hypertension: Secondary | ICD-10-CM | POA: Diagnosis not present

## 2016-04-08 DIAGNOSIS — Z0001 Encounter for general adult medical examination with abnormal findings: Secondary | ICD-10-CM

## 2016-04-08 DIAGNOSIS — T502X5A Adverse effect of carbonic-anhydrase inhibitors, benzothiadiazides and other diuretics, initial encounter: Secondary | ICD-10-CM

## 2016-04-08 DIAGNOSIS — R7303 Prediabetes: Secondary | ICD-10-CM | POA: Diagnosis not present

## 2016-04-08 DIAGNOSIS — E876 Hypokalemia: Secondary | ICD-10-CM | POA: Diagnosis not present

## 2016-04-08 DIAGNOSIS — Z0000 Encounter for general adult medical examination without abnormal findings: Secondary | ICD-10-CM | POA: Diagnosis not present

## 2016-04-08 LAB — BASIC METABOLIC PANEL WITH GFR
BUN: 9 mg/dL (ref 6–23)
CO2: 27 meq/L (ref 19–32)
Calcium: 9.1 mg/dL (ref 8.4–10.5)
Chloride: 102 meq/L (ref 96–112)
Creatinine, Ser: 0.72 mg/dL (ref 0.40–1.20)
GFR: 116.21 mL/min
Glucose, Bld: 85 mg/dL (ref 70–99)
Potassium: 3.1 meq/L — ABNORMAL LOW (ref 3.5–5.1)
Sodium: 137 meq/L (ref 135–145)

## 2016-04-08 LAB — HEMOGLOBIN A1C: HEMOGLOBIN A1C: 6.2 % (ref 4.6–6.5)

## 2016-04-08 MED ORDER — FUROSEMIDE 40 MG PO TABS: 40.0000 mg | ORAL_TABLET | Freq: Every day | ORAL | 2 refills | Status: DC

## 2016-04-08 NOTE — Assessment & Plan Note (Signed)
CMET today Continue Potassium supplement for now unless directed otherwise

## 2016-04-08 NOTE — Patient Instructions (Signed)
Health Maintenance, Female Adopting a healthy lifestyle and getting preventive care can go a long way to promote health and wellness. Talk with your health care provider about what schedule of regular examinations is right for you. This is a good chance for you to check in with your provider about disease prevention and staying healthy. In between checkups, there are plenty of things you can do on your own. Experts have done a lot of research about which lifestyle changes and preventive measures are most likely to keep you healthy. Ask your health care provider for more information. WEIGHT AND DIET  Eat a healthy diet  Be sure to include plenty of vegetables, fruits, low-fat dairy products, and lean protein.  Do not eat a lot of foods high in solid fats, added sugars, or salt.  Get regular exercise. This is one of the most important things you can do for your health.  Most adults should exercise for at least 150 minutes each week. The exercise should increase your heart rate and make you sweat (moderate-intensity exercise).  Most adults should also do strengthening exercises at least twice a week. This is in addition to the moderate-intensity exercise.  Maintain a healthy weight  Body mass index (BMI) is a measurement that can be used to identify possible weight problems. It estimates body fat based on height and weight. Your health care provider can help determine your BMI and help you achieve or maintain a healthy weight.  For females 20 years of age and older:   A BMI below 18.5 is considered underweight.  A BMI of 18.5 to 24.9 is normal.  A BMI of 25 to 29.9 is considered overweight.  A BMI of 30 and above is considered obese.  Watch levels of cholesterol and blood lipids  You should start having your blood tested for lipids and cholesterol at 38 years of age, then have this test every 5 years.  You may need to have your cholesterol levels checked more often if:  Your lipid  or cholesterol levels are high.  You are older than 38 years of age.  You are at high risk for heart disease.  CANCER SCREENING   Lung Cancer  Lung cancer screening is recommended for adults 55-80 years old who are at high risk for lung cancer because of a history of smoking.  A yearly low-dose CT scan of the lungs is recommended for people who:  Currently smoke.  Have quit within the past 15 years.  Have at least a 30-pack-year history of smoking. A pack year is smoking an average of one pack of cigarettes a day for 1 year.  Yearly screening should continue until it has been 15 years since you quit.  Yearly screening should stop if you develop a health problem that would prevent you from having lung cancer treatment.  Breast Cancer  Practice breast self-awareness. This means understanding how your breasts normally appear and feel.  It also means doing regular breast self-exams. Let your health care provider know about any changes, no matter how small.  If you are in your 20s or 30s, you should have a clinical breast exam (CBE) by a health care provider every 1-3 years as part of a regular health exam.  If you are 40 or older, have a CBE every year. Also consider having a breast X-ray (mammogram) every year.  If you have a family history of breast cancer, talk to your health care provider about genetic screening.  If you   are at high risk for breast cancer, talk to your health care provider about having an MRI and a mammogram every year.  Breast cancer gene (BRCA) assessment is recommended for women who have family members with BRCA-related cancers. BRCA-related cancers include:  Breast.  Ovarian.  Tubal.  Peritoneal cancers.  Results of the assessment will determine the need for genetic counseling and BRCA1 and BRCA2 testing. Cervical Cancer Your health care provider may recommend that you be screened regularly for cancer of the pelvic organs (ovaries, uterus, and  vagina). This screening involves a pelvic examination, including checking for microscopic changes to the surface of your cervix (Pap test). You may be encouraged to have this screening done every 3 years, beginning at age 21.  For women ages 30-65, health care providers may recommend pelvic exams and Pap testing every 3 years, or they may recommend the Pap and pelvic exam, combined with testing for human papilloma virus (HPV), every 5 years. Some types of HPV increase your risk of cervical cancer. Testing for HPV may also be done on women of any age with unclear Pap test results.  Other health care providers may not recommend any screening for nonpregnant women who are considered low risk for pelvic cancer and who do not have symptoms. Ask your health care provider if a screening pelvic exam is right for you.  If you have had past treatment for cervical cancer or a condition that could lead to cancer, you need Pap tests and screening for cancer for at least 20 years after your treatment. If Pap tests have been discontinued, your risk factors (such as having a new sexual partner) need to be reassessed to determine if screening should resume. Some women have medical problems that increase the chance of getting cervical cancer. In these cases, your health care provider may recommend more frequent screening and Pap tests. Colorectal Cancer  This type of cancer can be detected and often prevented.  Routine colorectal cancer screening usually begins at 38 years of age and continues through 38 years of age.  Your health care provider may recommend screening at an earlier age if you have risk factors for colon cancer.  Your health care provider may also recommend using home test kits to check for hidden blood in the stool.  A small camera at the end of a tube can be used to examine your colon directly (sigmoidoscopy or colonoscopy). This is done to check for the earliest forms of colorectal  cancer.  Routine screening usually begins at age 50.  Direct examination of the colon should be repeated every 5-10 years through 38 years of age. However, you may need to be screened more often if early forms of precancerous polyps or small growths are found. Skin Cancer  Check your skin from head to toe regularly.  Tell your health care provider about any new moles or changes in moles, especially if there is a change in a mole's shape or color.  Also tell your health care provider if you have a mole that is larger than the size of a pencil eraser.  Always use sunscreen. Apply sunscreen liberally and repeatedly throughout the day.  Protect yourself by wearing long sleeves, pants, a wide-brimmed hat, and sunglasses whenever you are outside. HEART DISEASE, DIABETES, AND HIGH BLOOD PRESSURE   High blood pressure causes heart disease and increases the risk of stroke. High blood pressure is more likely to develop in:  People who have blood pressure in the high end   of the normal range (130-139/85-89 mm Hg).  People who are overweight or obese.  People who are African American.  If you are 38-23 years of age, have your blood pressure checked every 3-5 years. If you are 61 years of age or older, have your blood pressure checked every year. You should have your blood pressure measured twice--once when you are at a hospital or clinic, and once when you are not at a hospital or clinic. Record the average of the two measurements. To check your blood pressure when you are not at a hospital or clinic, you can use:  An automated blood pressure machine at a pharmacy.  A home blood pressure monitor.  If you are between 45 years and 39 years old, ask your health care provider if you should take aspirin to prevent strokes.  Have regular diabetes screenings. This involves taking a blood sample to check your fasting blood sugar level.  If you are at a normal weight and have a low risk for diabetes,  have this test once every three years after 38 years of age.  If you are overweight and have a high risk for diabetes, consider being tested at a younger age or more often. PREVENTING INFECTION  Hepatitis B  If you have a higher risk for hepatitis B, you should be screened for this virus. You are considered at high risk for hepatitis B if:  You were born in a country where hepatitis B is common. Ask your health care provider which countries are considered high risk.  Your parents were born in a high-risk country, and you have not been immunized against hepatitis B (hepatitis B vaccine).  You have HIV or AIDS.  You use needles to inject street drugs.  You live with someone who has hepatitis B.  You have had sex with someone who has hepatitis B.  You get hemodialysis treatment.  You take certain medicines for conditions, including cancer, organ transplantation, and autoimmune conditions. Hepatitis C  Blood testing is recommended for:  Everyone born from [birth year] through 1963.  Anyone with known risk factors for hepatitis C. Sexually transmitted infections (STIs)  You should be screened for sexually transmitted infections (STIs) including gonorrhea and chlamydia if:  You are sexually active and are younger than 38 years of age.  You are older than 38 years of age and your health care provider tells you that you are at risk for this type of infection.  Your sexual activity has changed since you were last screened and you are at an increased risk for chlamydia or gonorrhea. Ask your health care provider if you are at risk.  If you do not have HIV, but are at risk, it may be recommended that you take a prescription medicine daily to prevent HIV infection. This is called pre-exposure prophylaxis (PrEP). You are considered at risk if:  You are sexually active and do not regularly use condoms or know the HIV status of your partner(s).  You take drugs by injection.  You are sexually  active with a partner who has HIV. Talk with your health care provider about whether you are at high risk of being infected with HIV. If you choose to begin PrEP, you should first be tested for HIV. You should then be tested every 3 months for as long as you are taking PrEP.  PREGNANCY   If you are premenopausal and you may become pregnant, ask your health care provider about preconception counseling.  If you may  become pregnant, take 400 to 800 micrograms (mcg) of folic acid every day.  If you want to prevent pregnancy, talk to your health care provider about birth control (contraception). OSTEOPOROSIS AND MENOPAUSE   Osteoporosis is a disease in which the bones lose minerals and strength with aging. This can result in serious bone fractures. Your risk for osteoporosis can be identified using a bone density scan.  If you are 61 years of age or older, or if you are at risk for osteoporosis and fractures, ask your health care provider if you should be screened.  Ask your health care provider whether you should take a calcium or vitamin D supplement to lower your risk for osteoporosis.  Menopause may have certain physical symptoms and risks.  Hormone replacement therapy may reduce some of these symptoms and risks. Talk to your health care provider about whether hormone replacement therapy is right for you.  HOME CARE INSTRUCTIONS   Schedule regular health, dental, and eye exams.  Stay current with your immunizations.   Do not use any tobacco products including cigarettes, chewing tobacco, or electronic cigarettes.  If you are pregnant, do not drink alcohol.  If you are breastfeeding, limit how much and how often you drink alcohol.  Limit alcohol intake to no more than 1 drink per day for nonpregnant women. One drink equals 12 ounces of beer, 5 ounces of wine, or 1 ounces of hard liquor.  Do not use street drugs.  Do not share needles.  Ask your health care provider for help if  you need support or information about quitting drugs.  Tell your health care provider if you often feel depressed.  Tell your health care provider if you have ever been abused or do not feel safe at home.   This information is not intended to replace advice given to you by your health care provider. Make sure you discuss any questions you have with your health care provider.   Document Released: 12/08/2010 Document Revised: 06/15/2014 Document Reviewed: 04/26/2013 Elsevier Interactive Patient Education [employer].

## 2016-04-08 NOTE — Assessment & Plan Note (Signed)
Controlled on Lasix 40 mg  CMET today RX sent to pharmacy

## 2016-04-08 NOTE — Assessment & Plan Note (Signed)
Encouraged her to work on diet and exercise 

## 2016-04-08 NOTE — Progress Notes (Signed)
Subjective:    Patient ID: Kathy [name], female    DOB: 23-Nov-1975, 40 y.o.   MRN: [id number]  HPI  Pt presents to the clinic today for her annual exam.  Flu: 02/2016 Tetanus: She thinks it was in 2013 Pap Smear: 01/2015 Dentist: biannually   Diet: She consumes lean meats and fruits. She reports she could be better with veggies. She tries to avoid carbs. She drinks mostly water. Exercise: She walks 2 miles a day, in 1 hour, 4 days a week while at work.  Review of Systems      Past Medical History:  Diagnosis Date  . Hypertension     Current Outpatient Prescriptions  Medication Sig Dispense Refill  . furosemide (LASIX) 20 MG tablet TAKE 1 TABLET BY MOUTH DAILY. 30 tablet 2  . potassium chloride SA (K-DUR,KLOR-CON) 20 MEQ tablet Take 1 tablet (20 mEq total) by mouth 2 (two) times daily. 60 tablet 2   No current facility-administered medications for this visit.     No Known Allergies  Family History  Problem Relation Age of Onset  . Hypertension Mother   . Diabetes Maternal Aunt     Social History   Social History  . Marital status: Single    Spouse name: N/A  . Number of children: N/A  . Years of education: N/A   Occupational History  . Not on file.   Social History Main Topics  . Smoking status: Never Smoker  . Smokeless tobacco: Never Used  . Alcohol use 0.0 oz/week     Comment: rare  . Drug use: No  . Sexual activity: Yes    Birth control/ protection: Surgical   Other Topics Concern  . Not on file   Social History Narrative  . No narrative on file     Constitutional: Pt reports weight gain. Denies fever, malaise, fatigue, headache.  HEENT: Denies eye pain, eye redness, ear pain, ringing in the ears, wax buildup, runny nose, nasal congestion, bloody nose, or sore throat. Respiratory: Denies difficulty breathing, shortness of breath, cough or sputum production.   Cardiovascular: Denies chest pain, chest tightness, palpitations or swelling  in the hands or feet.  Gastrointestinal: Denies abdominal pain, bloating, constipation, diarrhea or blood in the stool.  GU: Denies urgency, frequency, pain with urination, burning sensation, blood in urine, odor or discharge. Musculoskeletal: Denies decrease in range of motion, difficulty with gait, muscle pain or joint pain and swelling.  Skin: Denies redness, rashes, lesions or ulcercations.  Neurological: Denies dizziness, difficulty with memory, difficulty with speech or problems with balance and coordination.  Psych: Denies anxiety, depression, SI/HI.  No other specific complaints in a complete review of systems (except as listed in HPI above).  Objective:   Physical Exam   BP 136/84   Pulse 76   Temp 98.1 F (36.7 C) (Oral)   Ht 5' 3.25" (1.607 m)   Wt 266 lb 12 oz (121 kg)   LMP 03/26/2016   SpO2 98%   BMI 46.88 kg/m  Wt Readings from Last 3 Encounters:  04/08/16 266 lb 12 oz (121 kg)  02/26/16 267 lb (121.1 kg)  10/23/15 261 lb (118.4 kg)    General: Appears her stated age, obese in NAD. Skin: Warm, dry and intact.  HEENT: Head: normal shape and size; Eyes: sclera white, no icterus, conjunctiva pink, PERRLA and EOMs intact; Ears: Tm's gray and intact, normal light reflex; Throat/Mouth: Teeth present, mucosa pink and moist, no exudate, lesions or ulcerations noted.  Neck:  Neck supple, trachea midline. No masses, lumps or thyromegaly present.  Cardiovascular: Normal rate and rhythm. S1,occasional split S2 noted.  No murmur, rubs or gallops noted. No JVD or BLE edema. Pulmonary/Chest: Normal effort and positive vesicular breath sounds. No respiratory distress. No wheezes, rales or ronchi noted.  Abdomen: Soft and nontender. Normal bowel sounds. No distention or masses noted. Liver, spleen and kidneys non palpable. Musculoskeletal: Normal range of motion. Strength 5/5 BUE/BLE. No difficulty with gait.  Neurological: Alert and oriented. Cranial nerves II-XII grossly intact.  Coordination normal.  Psychiatric: Mood and affect normal. Behavior is normal. Judgment and thought content normal.     BMET    Component Value Date/Time   NA 138 02/26/2016 1323   K 3.2 (L) 02/26/2016 1323   CL 104 02/26/2016 1323   CO2 28 02/26/2016 1323   GLUCOSE 133 (H) 02/26/2016 1323   BUN 6 02/26/2016 1323   CREATININE 0.79 02/26/2016 1323   CREATININE 0.99 01/05/2014 1353   CALCIUM 8.3 (L) 02/26/2016 1323    Lipid Panel     Component Value Date/Time   CHOL 125 10/23/2015 1017   TRIG 101.0 10/23/2015 1017   HDL 34.50 (L) 10/23/2015 1017   CHOLHDL 4 10/23/2015 1017   VLDL 20.2 10/23/2015 1017   LDLCALC 70 10/23/2015 1017    CBC    Component Value Date/Time   WBC 4.8 10/23/2015 1017   RBC 4.59 10/23/2015 1017   HGB 11.8 (L) 10/23/2015 1017   HCT 36.1 10/23/2015 1017   PLT 256.0 10/23/2015 1017   MCV 78.7 10/23/2015 1017   MCH 27.9 01/05/2014 1353   MCHC 32.6 10/23/2015 1017   RDW 18.8 (H) 10/23/2015 1017    Hgb A1C Lab Results  Component Value Date   HGBA1C 6.4 10/23/2015       Assessment & Plan:   Preventative Health Maintenance:  Flu shot UTD Tetanus booster today Pap smear UTD Encouraged her to consume a balanced diet and exercise regimen Advised her to see a dentist annually She had labs drawn 10/2015, will check A1C and BMET today  RTC in 1 year, sooner if needed Nicki ReaperBAITY, Yun Gutierrez, NP

## 2016-04-08 NOTE — Assessment & Plan Note (Signed)
Will check A1C today Encouraged her to consume a low carb diet and exercise to lose weight If A1C> 6.5% will bring her back to discuss diabetes

## 2016-04-10 NOTE — Addendum Note (Signed)
Addended by: Roena MaladyEVONTENNO, Mikko Lewellen Y on: 04/10/2016 10:49 AM   Modules accepted: Orders

## 2016-04-20 ENCOUNTER — Encounter: Payer: Self-pay | Admitting: Internal Medicine

## 2016-04-22 ENCOUNTER — Encounter: Payer: Self-pay | Admitting: Internal Medicine

## 2016-09-04 ENCOUNTER — Encounter: Payer: Self-pay | Admitting: Physician Assistant

## 2016-09-04 ENCOUNTER — Ambulatory Visit: Payer: Self-pay | Admitting: Physician Assistant

## 2016-09-04 VITALS — BP 140/110 | Temp 99.1°F

## 2016-09-04 DIAGNOSIS — J069 Acute upper respiratory infection, unspecified: Secondary | ICD-10-CM

## 2016-09-04 DIAGNOSIS — R509 Fever, unspecified: Secondary | ICD-10-CM

## 2016-09-04 LAB — POCT INFLUENZA A/B
INFLUENZA B, POC: NEGATIVE
Influenza A, POC: NEGATIVE

## 2016-09-04 MED ORDER — OSELTAMIVIR PHOSPHATE 75 MG PO CAPS: 75.0000 mg | ORAL_CAPSULE | Freq: Two times a day (BID) | ORAL | 0 refills | Status: DC

## 2016-09-04 MED ORDER — HYDROCOD POLST-CPM POLST ER 10-8 MG/5ML PO SUER: 5.0000 mL | Freq: Two times a day (BID) | ORAL | 0 refills | Status: DC | PRN

## 2016-09-04 NOTE — Progress Notes (Signed)
S: C/o runny nose and congestion with dry cough for 2 days, + fever, chills, and body aches; denies cp/sob, v/d; mucus is clear throughout the day, cough is sporadic,   Using otc meds: robitussin  O: PE: vitals wnl, nad,  perrl eomi, normocephalic, tms dull, nasal mucosa red and swollen, throat injected, neck supple no lymph, lungs c t a, cv rrr, neuro intact, flu swab neg  A:  Acute flu like illness   P: drink fluids, continue regular meds , use otc meds of choice, return if not improving in 5 days, return earlier if worsening tamiflu, tussionex 150 ml nr

## 2016-09-25 ENCOUNTER — Other Ambulatory Visit: Payer: Self-pay | Admitting: Internal Medicine

## 2016-10-09 ENCOUNTER — Other Ambulatory Visit: Payer: Self-pay | Admitting: Internal Medicine

## 2016-10-09 NOTE — Telephone Encounter (Signed)
Pt left v/m requesting cb about refills and results; per DPR left v/m do not see any results and there is a pending refill request for furosemide; if anything further needed pt to cb.

## 2016-10-09 NOTE — Telephone Encounter (Signed)
In your OV note from 04/2016 you said for pt to continue-- just want to make sure as it is obvious she has not been taking daily--- please advise if okay to refill

## 2016-12-28 ENCOUNTER — Encounter: Payer: Self-pay | Admitting: Physician Assistant

## 2016-12-28 ENCOUNTER — Ambulatory Visit: Payer: Self-pay | Admitting: Physician Assistant

## 2016-12-28 VITALS — BP 174/100 | HR 87 | Temp 98.1°F

## 2016-12-28 DIAGNOSIS — J020 Streptococcal pharyngitis: Secondary | ICD-10-CM

## 2016-12-28 LAB — POCT RAPID STREP A (OFFICE): Rapid Strep A Screen: POSITIVE — AB

## 2016-12-28 MED ORDER — AMOXICILLIN 875 MG PO TABS: 875.0000 mg | ORAL_TABLET | Freq: Two times a day (BID) | ORAL | 0 refills | Status: DC

## 2016-12-28 MED ORDER — FLUCONAZOLE 150 MG PO TABS: ORAL_TABLET | ORAL | 0 refills | Status: DC

## 2016-12-28 NOTE — Progress Notes (Signed)
S: c/o sore throat for 1 day, no fever, chills, did feel worse yesterday, no cough or congestion, no cp/sob  O: vitals wnl, nad, tms clear, throat red with exudate posteriorly, neck supple no lymph, lungs c t a, cv rrr, q strep +  A: acute strep throat  P: amoxil , diflucan

## 2017-01-18 ENCOUNTER — Other Ambulatory Visit: Payer: Self-pay | Admitting: Internal Medicine

## 2017-01-18 NOTE — Telephone Encounter (Signed)
yes

## 2017-01-18 NOTE — Telephone Encounter (Signed)
Looks like at last OV 11/17--- pt was supposed to be changed to spironolactone... I Left detailed msg on VM per HIPAA But I am not sure why it was not followed up on... I called pt lmovm to have her return my call.... Should pt come in for lab only appt to check potassium

## 2017-01-21 ENCOUNTER — Other Ambulatory Visit: Payer: Self-pay

## 2017-01-21 ENCOUNTER — Other Ambulatory Visit: Payer: Self-pay | Admitting: Internal Medicine

## 2017-01-21 ENCOUNTER — Encounter: Payer: Self-pay | Admitting: Internal Medicine

## 2017-01-21 ENCOUNTER — Ambulatory Visit (INDEPENDENT_AMBULATORY_CARE_PROVIDER_SITE_OTHER): Payer: 59 | Admitting: Internal Medicine

## 2017-01-21 VITALS — BP 148/98 | HR 63 | Temp 98.1°F | Wt 277.5 lb

## 2017-01-21 DIAGNOSIS — I10 Essential (primary) hypertension: Secondary | ICD-10-CM

## 2017-01-21 DIAGNOSIS — T502X5A Adverse effect of carbonic-anhydrase inhibitors, benzothiadiazides and other diuretics, initial encounter: Secondary | ICD-10-CM | POA: Diagnosis not present

## 2017-01-21 DIAGNOSIS — E876 Hypokalemia: Secondary | ICD-10-CM | POA: Diagnosis not present

## 2017-01-21 LAB — BASIC METABOLIC PANEL WITH GFR
BUN: 8 mg/dL (ref 6–23)
CO2: 28 meq/L (ref 19–32)
Calcium: 8.4 mg/dL (ref 8.4–10.5)
Chloride: 104 meq/L (ref 96–112)
Creatinine, Ser: 0.73 mg/dL (ref 0.40–1.20)
GFR: 113.9 mL/min
Glucose, Bld: 156 mg/dL — ABNORMAL HIGH (ref 70–99)
Potassium: 3.7 meq/L (ref 3.5–5.1)
Sodium: 137 meq/L (ref 135–145)

## 2017-01-21 MED ORDER — LOSARTAN POTASSIUM-HCTZ 50-12.5 MG PO TABS: 1.0000 | ORAL_TABLET | Freq: Every day | ORAL | 0 refills | Status: DC

## 2017-01-21 MED ORDER — POTASSIUM CHLORIDE CRYS ER 20 MEQ PO TBCR: 20.0000 meq | EXTENDED_RELEASE_TABLET | Freq: Two times a day (BID) | ORAL | 2 refills | Status: DC

## 2017-01-21 NOTE — Progress Notes (Signed)
Subjective:    Patient ID: Kathy [name], female    DOB: 06-18-1975, 41 y.o.   MRN: [id number]  HPI  Pt presents to the clinic today for follow up of HTN. At her last visit, I had wanted to switch her Lasix to Spironolactone because of diuretic induced hypokalemia despite taking potassium supplement. She somehow, never received this message, and has continue to take the Lasix and Potassium. Her BP today is 148/98. She denies headaches, visual changes, dizziness, chest tightness, shortness of breath or muscle cramps. ECG from 01/2015 reviewed.  Review of Systems      Past Medical History:  Diagnosis Date  . Hypertension     Current Outpatient Prescriptions  Medication Sig Dispense Refill  . fluconazole (DIFLUCAN) 150 MG tablet Take one now and one in a week 2 tablet 0  . furosemide (LASIX) 20 MG tablet TAKE 1 TABLET BY MOUTH DAILY 30 tablet 2  . potassium chloride SA (K-DUR,KLOR-CON) 20 MEQ tablet Take 1 tablet (20 mEq total) by mouth 2 (two) times daily. 60 tablet 2   No current facility-administered medications for this visit.     No Known Allergies  Family History  Problem Relation Age of Onset  . Hypertension Mother   . Diabetes Maternal Aunt     Social History   Social History  . Marital status: Single    Spouse name: N/A  . Number of children: N/A  . Years of education: N/A   Occupational History  . Not on file.   Social History Main Topics  . Smoking status: Never Smoker  . Smokeless tobacco: Never Used  . Alcohol use 0.0 oz/week     Comment: rare  . Drug use: No  . Sexual activity: Yes    Birth control/ protection: Surgical   Other Topics Concern  . Not on file   Social History Narrative  . No narrative on file     Constitutional: Denies fever, malaise, fatigue, headache or abrupt weight changes.  Respiratory: Denies difficulty breathing, shortness of breath, cough or sputum production.   Cardiovascular: Denies chest pain, chest  tightness, palpitations or swelling in the hands or feet.  Neurological: Denies dizziness, difficulty with memory, difficulty with speech or problems with balance and coordination.    No other specific complaints in a complete review of systems (except as listed in HPI above).  Objective:   Physical Exam   BP (!) 148/98   Pulse 63   Temp 98.1 F (36.7 C) (Oral)   Wt 277 lb 8 oz (125.9 kg)   LMP 10/29/2016   SpO2 98%   BMI 48.77 kg/m  Wt Readings from Last 3 Encounters:  01/21/17 277 lb 8 oz (125.9 kg)  04/08/16 266 lb 12 oz (121 kg)  02/26/16 267 lb (121.1 kg)    General: Appears her stated age, obese in NAD. Cardiovascular: Normal rate and rhythm. S1,S2 noted.  No murmur, rubs or gallops noted. 1+ pitting BLE edema.  Pulmonary/Chest: Normal effort and positive vesicular breath sounds. No respiratory distress. No wheezes, rales or ronchi noted.  Neurological: Alert and oriented.   BMET    Component Value Date/Time   NA 137 04/08/2016 1405   K 3.1 (L) 04/08/2016 1405   CL 102 04/08/2016 1405   CO2 27 04/08/2016 1405   GLUCOSE 85 04/08/2016 1405   BUN 9 04/08/2016 1405   CREATININE 0.72 04/08/2016 1405   CREATININE 0.99 01/05/2014 1353   CALCIUM 9.1 04/08/2016 1405  Lipid Panel     Component Value Date/Time   CHOL 125 10/23/2015 1017   TRIG 101.0 10/23/2015 1017   HDL 34.50 (L) 10/23/2015 1017   CHOLHDL 4 10/23/2015 1017   VLDL 20.2 10/23/2015 1017   LDLCALC 70 10/23/2015 1017    CBC    Component Value Date/Time   WBC 4.8 10/23/2015 1017   RBC 4.59 10/23/2015 1017   HGB 11.8 (L) 10/23/2015 1017   HCT 36.1 10/23/2015 1017   PLT 256.0 10/23/2015 1017   MCV 78.7 10/23/2015 1017   MCH 27.9 01/05/2014 1353   MCHC 32.6 10/23/2015 1017   RDW 18.8 (H) 10/23/2015 1017    Hgb A1C Lab Results  Component Value Date   HGBA1C 6.2 04/08/2016           Assessment & Plan:

## 2017-01-21 NOTE — Addendum Note (Signed)
Addended by: Roena MaladyEVONTENNO, Eulice Rutledge Y on: 01/21/2017 05:24 PM   Modules accepted: Orders

## 2017-01-21 NOTE — Patient Instructions (Signed)

## 2017-01-21 NOTE — Assessment & Plan Note (Signed)
-   BMET today 

## 2017-01-21 NOTE — Assessment & Plan Note (Signed)
Uncontrolled BMET today Will likely D/C Lasix and switch to Losartan HCT pending labs Encouraged her to consume a low salt diet and exercise for weight loss  RTC in 3 weeks for follow up HTN

## 2017-02-11 ENCOUNTER — Encounter: Payer: Self-pay | Admitting: Internal Medicine

## 2017-02-11 ENCOUNTER — Ambulatory Visit (INDEPENDENT_AMBULATORY_CARE_PROVIDER_SITE_OTHER): Payer: 59 | Admitting: Internal Medicine

## 2017-02-11 DIAGNOSIS — I10 Essential (primary) hypertension: Secondary | ICD-10-CM | POA: Diagnosis not present

## 2017-02-11 MED ORDER — LOSARTAN POTASSIUM-HCTZ 50-12.5 MG PO TABS: 2.0000 | ORAL_TABLET | Freq: Every day | ORAL | 0 refills | Status: DC

## 2017-02-11 NOTE — Patient Instructions (Signed)

## 2017-02-11 NOTE — Assessment & Plan Note (Signed)
Improved but not at goal Increase Losartan HCT to 2 tabs daily Hold potassium supplement for now Will check BMET in 2 weeks at HTN followup  RTC in 2 weeks for HTN followup

## 2017-02-11 NOTE — Progress Notes (Signed)
Subjective:    Patient ID: Kathy [name] GeneralJacqueline M Kyllonen, female    DOB: Oct 01, 1975, 41 y.o.   MRN: [id number]  HPI  Pt presents to the clinic today for 3 week follow up of HTN. At her last visit, her Lasix was d/c'd and she was started on Losartan-HCT. She has been taking the medication as prescribed. She denies adverse side effects. Her BP today is 136/98. She also wants to know if she can stop taking the Potassium supplement and just eat more potassium containing foods.  Review of Systems      Past Medical History:  Diagnosis Date  . Hypertension     Current Outpatient Prescriptions  Medication Sig Dispense Refill  . losartan-hydrochlorothiazide (HYZAAR) 50-12.5 MG tablet Take 1 tablet by mouth daily. 30 tablet 0  . potassium chloride SA (K-DUR,KLOR-CON) 20 MEQ tablet Take 1 tablet (20 mEq total) by mouth 2 (two) times daily. 60 tablet 2   No current facility-administered medications for this visit.     No Known Allergies  Family History  Problem Relation Age of Onset  . Hypertension Mother   . Diabetes Maternal Aunt     Social History   Social History  . Marital status: Single    Spouse name: N/A  . Number of children: N/A  . Years of education: N/A   Occupational History  . Not on file.   Social History Main Topics  . Smoking status: Never Smoker  . Smokeless tobacco: Never Used  . Alcohol use 0.0 oz/week     Comment: rare  . Drug use: No  . Sexual activity: Yes    Birth control/ protection: Surgical   Other Topics Concern  . Not on file   Social History Narrative  . No narrative on file     Constitutional: Denies fever, malaise, fatigue, headache or abrupt weight changes.  Respiratory: Denies difficulty breathing, shortness of breath, cough or sputum production.   Cardiovascular: Denies chest pain, chest tightness, palpitations or swelling in the hands or feet.  Neurological: Denies dizziness, difficulty with memory, difficulty with speech or problems  with balance and coordination.    No other specific complaints in a complete review of systems (except as listed in HPI above).  Objective:   Physical Exam   BP (!) 136/98   Pulse 66   Temp 98.1 F (36.7 C) (Oral)   Wt 277 lb 12 oz (126 kg)   SpO2 98%   BMI 48.81 kg/m  Wt Readings from Last 3 Encounters:  02/11/17 277 lb 12 oz (126 kg)  01/21/17 277 lb 8 oz (125.9 kg)  04/08/16 266 lb 12 oz (121 kg)    General: Appears her stated age, obese in NAD. Cardiovascular: Normal rate and rhythm.  Pulmonary/Chest: Normal effort and positive vesicular breath sounds. No respiratory distress. No wheezes, rales or ronchi noted.  Neurological: Alert and oriented.    BMET    Component Value Date/Time   NA 137 01/21/2017 0918   K 3.7 01/21/2017 0918   CL 104 01/21/2017 0918   CO2 28 01/21/2017 0918   GLUCOSE 156 (H) 01/21/2017 0918   BUN 8 01/21/2017 0918   CREATININE 0.73 01/21/2017 0918   CREATININE 0.99 01/05/2014 1353   CALCIUM 8.4 01/21/2017 0918    Lipid Panel     Component Value Date/Time   CHOL 125 10/23/2015 1017   TRIG 101.0 10/23/2015 1017   HDL 34.50 (L) 10/23/2015 1017   CHOLHDL 4 10/23/2015 1017   VLDL 20.2  10/23/2015 1017   LDLCALC 70 10/23/2015 1017    CBC    Component Value Date/Time   WBC 4.8 10/23/2015 1017   RBC 4.59 10/23/2015 1017   HGB 11.8 (L) 10/23/2015 1017   HCT 36.1 10/23/2015 1017   PLT 256.0 10/23/2015 1017   MCV 78.7 10/23/2015 1017   MCH 27.9 01/05/2014 1353   MCHC 32.6 10/23/2015 1017   RDW 18.8 (H) 10/23/2015 1017    Hgb A1C Lab Results  Component Value Date   HGBA1C 6.2 04/08/2016           Assessment & Plan:

## 2017-02-17 ENCOUNTER — Other Ambulatory Visit: Payer: Self-pay | Admitting: Internal Medicine

## 2017-02-25 ENCOUNTER — Encounter: Payer: Self-pay | Admitting: Internal Medicine

## 2017-02-25 ENCOUNTER — Ambulatory Visit (INDEPENDENT_AMBULATORY_CARE_PROVIDER_SITE_OTHER): Payer: 59 | Admitting: Internal Medicine

## 2017-02-25 VITALS — BP 146/94 | HR 83 | Temp 98.8°F | Wt 270.0 lb

## 2017-02-25 DIAGNOSIS — N898 Other specified noninflammatory disorders of vagina: Secondary | ICD-10-CM | POA: Diagnosis not present

## 2017-02-25 DIAGNOSIS — N760 Acute vaginitis: Secondary | ICD-10-CM | POA: Diagnosis not present

## 2017-02-25 DIAGNOSIS — B9689 Other specified bacterial agents as the cause of diseases classified elsewhere: Secondary | ICD-10-CM

## 2017-02-25 DIAGNOSIS — I10 Essential (primary) hypertension: Secondary | ICD-10-CM

## 2017-02-25 MED ORDER — METRONIDAZOLE 500 MG PO TABS: 500.0000 mg | ORAL_TABLET | Freq: Two times a day (BID) | ORAL | 0 refills | Status: DC

## 2017-02-25 NOTE — Progress Notes (Signed)
Subjective:    Patient ID: Kathy [name], female    DOB: [birth year], 41 y.o.   MRN: [id number]  HPI  Pt presents to the clinic today for 2 week follow up of HTN. At her last visit, her Losartan HCT was increased to 2 tablets daily. She has been taking the medication as prescribed. She denies adverse side effects. Her BP today is 146/94. ECG from 01/2015 reviewed.  She also c/o vaginal discharge and itching. This started 1 week ago. She reports the discharge is thin and white. She has noticed a fishy odor. She denies pelvic pain or abnormal bleeding. She does douches and uses Summer's Eve products. She does not take bubble bath. She has not tried anything OTC for her symptoms.   Review of Systems  Past Medical History:  Diagnosis Date  . Hypertension     Current Outpatient Prescriptions  Medication Sig Dispense Refill  . losartan-hydrochlorothiazide (HYZAAR) 50-12.5 MG tablet Take 2 tablets by mouth daily. 60 tablet 0  . potassium chloride SA (K-DUR,KLOR-CON) 20 MEQ tablet Take 1 tablet (20 mEq total) by mouth 2 (two) times daily. 60 tablet 2   No current facility-administered medications for this visit.     No Known Allergies  Family History  Problem Relation Age of Onset  . Hypertension Mother   . Diabetes Maternal Aunt     Social History   Social History  . Marital status: Single    Spouse name: N/A  . Number of children: N/A  . Years of education: N/A   Occupational History  . Not on file.   Social History Main Topics  . Smoking status: Never Smoker  . Smokeless tobacco: Never Used  . Alcohol use 0.0 oz/week     Comment: rare  . Drug use: No  . Sexual activity: Yes    Birth control/ protection: Surgical   Other Topics Concern  . Not on file   Social History Narrative  . No narrative on file     Constitutional: Denies fever, malaise, fatigue, headache or abrupt weight changes.  Respiratory: Denies difficulty breathing, shortness of breath, cough  or sputum production.   Cardiovascular: Denies chest pain, chest tightness, palpitations or swelling in the hands or feet.  Gastrointestinal: Denies abdominal pain, bloating, constipation, diarrhea or blood in the stool.  GU: Pt reports vaginal discharge and itching. Denies urgency, frequency, pain with urination, burning sensation, blood in urine, odor. Neurological: Denies dizziness, difficulty with memory, difficulty with speech or problems with balance and coordination.    No other specific complaints in a complete review of systems (except as listed in HPI above).     Objective:   Physical Exam   BP (!) 146/94   Pulse 83   Temp 98.8 F (37.1 C) (Oral)   Wt 270 lb (122.5 kg)   LMP 02/11/2017   SpO2 98%   BMI 47.45 kg/m  Wt Readings from Last 3 Encounters:  02/25/17 270 lb (122.5 kg)  02/11/17 277 lb 12 oz (126 kg)  01/21/17 277 lb 8 oz (125.9 kg)    [name]: Appears her stated age, obese in NAD. Cardiovascular: Normal rate and rhythm. S1,S2 noted.  No murmur, rubs or gallops noted.  Pulmonary/Chest: Normal effort and positive vesicular breath sounds. No respiratory distress. No wheezes, rales or ronchi noted.  Abdomen: Soft and nontender. Normal bowel sounds.  Pelvic: Self swabbed.  Neurological: Alert and oriented.   BMET    Component Value Date/Time   NA 137  01/21/2017 0918   K 3.7 01/21/2017 0918   CL 104 01/21/2017 0918   CO2 28 01/21/2017 0918   GLUCOSE 156 (H) 01/21/2017 0918   BUN 8 01/21/2017 0918   CREATININE 0.73 01/21/2017 0918   CREATININE 0.99 01/05/2014 1353   CALCIUM 8.4 01/21/2017 0918    Lipid Panel     Component Value Date/Time   CHOL 125 10/23/2015 1017   TRIG 101.0 10/23/2015 1017   HDL 34.50 (L) 10/23/2015 1017   CHOLHDL 4 10/23/2015 1017   VLDL 20.2 10/23/2015 1017   LDLCALC 70 10/23/2015 1017    CBC    Component Value Date/Time   WBC 4.8 10/23/2015 1017   RBC 4.59 10/23/2015 1017   HGB 11.8 (L) 10/23/2015 1017   HCT 36.1  10/23/2015 1017   PLT 256.0 10/23/2015 1017   MCV 78.7 10/23/2015 1017   MCH 27.9 01/05/2014 1353   MCHC 32.6 10/23/2015 1017   RDW 18.8 (H) 10/23/2015 1017    Hgb A1C Lab Results  Component Value Date   HGBA1C 6.2 04/08/2016           Assessment & Plan:    Vaginal Discharge and Odor secondary to BV:  Wet prep: + clue cells eRx for Metronidazole 500 mg BID x 5 days, advised her no alcohol with this medications Advised her to stop douching  RTC in 2 weeks for BP follow up Nicki Reaper, NP

## 2017-02-25 NOTE — Patient Instructions (Signed)

## 2017-02-25 NOTE — Assessment & Plan Note (Addendum)
Uncontrolled Continue Losartan HCT She does not want to add medications at this time Discussed DASH diet and exercise for weight loss  RTC in 2 weeks for follow up HTN.

## 2017-03-11 ENCOUNTER — Ambulatory Visit (INDEPENDENT_AMBULATORY_CARE_PROVIDER_SITE_OTHER): Payer: 59 | Admitting: Internal Medicine

## 2017-03-11 VITALS — BP 136/88 | HR 60 | Temp 98.2°F | Wt 276.0 lb

## 2017-03-11 DIAGNOSIS — I10 Essential (primary) hypertension: Secondary | ICD-10-CM | POA: Diagnosis not present

## 2017-03-11 DIAGNOSIS — E876 Hypokalemia: Secondary | ICD-10-CM

## 2017-03-11 DIAGNOSIS — T502X5A Adverse effect of carbonic-anhydrase inhibitors, benzothiadiazides and other diuretics, initial encounter: Secondary | ICD-10-CM

## 2017-03-11 LAB — BASIC METABOLIC PANEL
BUN: 7 mg/dL (ref 6–23)
CO2: 29 meq/L (ref 19–32)
Calcium: 9.1 mg/dL (ref 8.4–10.5)
Chloride: 100 mEq/L (ref 96–112)
Creatinine, Ser: 0.65 mg/dL (ref 0.40–1.20)
GFR: 130.14 mL/min (ref >60.00)
GLUCOSE: 126 mg/dL — AB (ref 70–99)
POTASSIUM: 3.9 meq/L (ref 3.5–5.1)
SODIUM: 135 meq/L (ref 135–145)

## 2017-03-11 MED ORDER — LOSARTAN POTASSIUM-HCTZ 50-12.5 MG PO TABS: 2.0000 | ORAL_TABLET | Freq: Every day | ORAL | 1 refills | Status: DC

## 2017-03-11 NOTE — Assessment & Plan Note (Signed)
Reasonable control on Losartan HCT, refilled x 6 months As long as we can keep her < 140/90, will not add another medication at this time She will consume a DASH diet and exercise for weight loss Discussed elevation for edema and TED hose as needed

## 2017-03-11 NOTE — Assessment & Plan Note (Signed)
BMET today Continue Potassium, will adjust as needed based on labs

## 2017-03-11 NOTE — Progress Notes (Signed)
Subjective:    Patient ID: Kathy [name], female    DOB: 1975/08/27, 41 y.o.   MRN: [id number]  HPI  Pt presents to the clinic today for 2 week follow up of HTN. At her last visit, her BP remained elevated despite taking Losartan HCT 100-25 mg daily. At that time, she refused to add additional medication. She really wanted to try to work on a DASH diet and exercise for weight loss. She has continued the medication as prescribed. Her weight is 276 lbs, no weight loss since her last visit. Her BP today is 136/88.  Review of Systems      Past Medical History:  Diagnosis Date  . Hypertension     Current Outpatient Prescriptions  Medication Sig Dispense Refill  . losartan-hydrochlorothiazide (HYZAAR) 50-12.5 MG tablet Take 2 tablets by mouth daily. 60 tablet 0  . metroNIDAZOLE (FLAGYL) 500 MG tablet Take 1 tablet (500 mg total) by mouth 2 (two) times daily. 10 tablet 0  . potassium chloride SA (K-DUR,KLOR-CON) 20 MEQ tablet Take 1 tablet (20 mEq total) by mouth 2 (two) times daily. 60 tablet 2   No current facility-administered medications for this visit.     No Known Allergies  Family History  Problem Relation Age of Onset  . Hypertension Mother   . Diabetes Maternal Aunt     Social History   Social History  . Marital status: Single    Spouse name: N/A  . Number of children: N/A  . Years of education: N/A   Occupational History  . Not on file.   Social History Main Topics  . Smoking status: Never Smoker  . Smokeless tobacco: Never Used  . Alcohol use 0.0 oz/week     Comment: rare  . Drug use: No  . Sexual activity: Yes    Birth control/ protection: Surgical   Other Topics Concern  . Not on file   Social History Narrative  . No narrative on file     Constitutional: Denies fever, malaise, fatigue, headache or abrupt weight changes.  Respiratory: Denies difficulty breathing, shortness of breath, cough or sputum production.   Cardiovascular: Denies  chest pain, chest tightness, palpitations or swelling in the hands or feet.  Neurological: Denies dizziness, difficulty with memory, difficulty with speech or problems with balance and coordination.    No other specific complaints in a complete review of systems (except as listed in HPI above).  Objective:   Physical Exam   BP 136/88   Pulse 60   Temp 98.2 F (36.8 C) (Oral)   Wt 276 lb (125.2 kg)   LMP 02/11/2017   SpO2 100%   BMI 48.51 kg/m  Wt Readings from Last 3 Encounters:  03/11/17 276 lb (125.2 kg)  02/25/17 270 lb (122.5 kg)  02/11/17 277 lb 12 oz (126 kg)    [name]: Appears her stated age, obese in NAD. Cardiovascular: Normal rate and rhythm. S1,S2 noted. Trace pitting BLE edema. Pulmonary/Chest: Normal effort and positive vesicular breath sounds. No respiratory distress. No wheezes, rales or ronchi noted.  Neurological: Alert and oriented.    BMET    Component Value Date/Time   NA 137 01/21/2017 0918   K 3.7 01/21/2017 0918   CL 104 01/21/2017 0918   CO2 28 01/21/2017 0918   GLUCOSE 156 (H) 01/21/2017 0918   BUN 8 01/21/2017 0918   CREATININE 0.73 01/21/2017 0918   CREATININE 0.99 01/05/2014 1353   CALCIUM 8.4 01/21/2017 0918    Lipid Panel  Component Value Date/Time   CHOL 125 10/23/2015 1017   TRIG 101.0 10/23/2015 1017   HDL 34.50 (L) 10/23/2015 1017   CHOLHDL 4 10/23/2015 1017   VLDL 20.2 10/23/2015 1017   LDLCALC 70 10/23/2015 1017    CBC    Component Value Date/Time   WBC 4.8 10/23/2015 1017   RBC 4.59 10/23/2015 1017   HGB 11.8 (L) 10/23/2015 1017   HCT 36.1 10/23/2015 1017   PLT 256.0 10/23/2015 1017   MCV 78.7 10/23/2015 1017   MCH 27.9 01/05/2014 1353   MCHC 32.6 10/23/2015 1017   RDW 18.8 (H) 10/23/2015 1017    Hgb A1C Lab Results  Component Value Date   HGBA1C 6.2 04/08/2016           Assessment & Plan:

## 2017-03-11 NOTE — Patient Instructions (Signed)

## 2017-04-28 ENCOUNTER — Encounter: Payer: Self-pay | Admitting: Internal Medicine

## 2017-05-05 ENCOUNTER — Encounter: Payer: Self-pay | Admitting: Internal Medicine

## 2017-05-05 NOTE — Progress Notes (Deleted)
   Subjective:    Patient ID: Kathy [name] GeneralJacqueline M Cadet, female    DOB: 01-21-1976, 41 y.o.   MRN: [id number]  HPI  Pt presents to the clinic today for her annual exam. She is also due to follow up chronic conditions.  HTN: Her BP today is. She is taking Losartan HCT as prescribed. She is taking a potassium supplement for chronic diuretic induced hypokalemia. ECG from 01/2015.  Borderline Diabetes: Her last A1C was 6.2%, 04/2016. She is currently not taking any diabetic medications at this time.  Flu: 02/2016 Tetanus: 04/2016 Pap Smear: 07/2012 Dentist:  Diet: Exercise:  Review of Systems      Past Medical History:  Diagnosis Date  . Hypertension     Current Outpatient Medications  Medication Sig Dispense Refill  . losartan-hydrochlorothiazide (HYZAAR) 50-12.5 MG tablet Take 2 tablets by mouth daily. 180 tablet 1  . potassium chloride SA (K-DUR,KLOR-CON) 20 MEQ tablet Take 1 tablet (20 mEq total) by mouth 2 (two) times daily. 60 tablet 2   No current facility-administered medications for this visit.     No Known Allergies  Family History  Problem Relation Age of Onset  . Hypertension Mother   . Diabetes Maternal Aunt     Social History   Socioeconomic History  . Marital status: Single    Spouse name: Not on file  . Number of children: Not on file  . Years of education: Not on file  . Highest education level: Not on file  Social Needs  . Financial resource strain: Not on file  . Food insecurity - worry: Not on file  . Food insecurity - inability: Not on file  . Transportation needs - medical: Not on file  . Transportation needs - non-medical: Not on file  Occupational History  . Not on file  Tobacco Use  . Smoking status: Never Smoker  . Smokeless tobacco: Never Used  Substance and Sexual Activity  . Alcohol use: Yes    Alcohol/week: 0.0 oz    Comment: rare  . Drug use: No  . Sexual activity: Yes    Birth control/protection: Surgical  Other Topics Concern    . Not on file  Social History Narrative  . Not on file     Constitutional: Denies fever, malaise, fatigue, headache or abrupt weight changes.  HEENT: Denies eye pain, eye redness, ear pain, ringing in the ears, wax buildup, runny nose, nasal congestion, bloody nose, or sore throat. Respiratory: Denies difficulty breathing, shortness of breath, cough or sputum production.   Cardiovascular: Denies chest pain, chest tightness, palpitations or swelling in the hands or feet.  Gastrointestinal: Denies abdominal pain, bloating, constipation, diarrhea or blood in the stool.  GU: Denies urgency, frequency, pain with urination, burning sensation, blood in urine, odor or discharge. Musculoskeletal: Denies decrease in range of motion, difficulty with gait, muscle pain or joint pain and swelling.  Skin: Denies redness, rashes, lesions or ulcercations.  Neurological: Denies dizziness, difficulty with memory, difficulty with speech or problems with balance and coordination.  Psych: Denies anxiety, depression, SI/HI.  No other specific complaints in a complete review of systems (except as listed in HPI above).  Objective:   Physical Exam        Assessment & Plan:

## 2017-05-26 ENCOUNTER — Ambulatory Visit (INDEPENDENT_AMBULATORY_CARE_PROVIDER_SITE_OTHER)
Admission: RE | Admit: 2017-05-26 | Discharge: 2017-05-26 | Disposition: A | Discharge status: Home / Self Care | Payer: 59 | Source: Ambulatory Visit | Attending: Family Medicine | Admitting: Family Medicine

## 2017-05-26 ENCOUNTER — Other Ambulatory Visit: Payer: Self-pay

## 2017-05-26 ENCOUNTER — Encounter: Payer: Self-pay | Admitting: Family Medicine

## 2017-05-26 ENCOUNTER — Ambulatory Visit (INDEPENDENT_AMBULATORY_CARE_PROVIDER_SITE_OTHER): Payer: 59 | Admitting: Family Medicine

## 2017-05-26 VITALS — BP 128/80 | HR 90 | Temp 97.6°F | Ht 63.5 in | Wt 277.0 lb

## 2017-05-26 DIAGNOSIS — S93492A Sprain of other ligament of left ankle, initial encounter: Secondary | ICD-10-CM | POA: Diagnosis not present

## 2017-05-26 DIAGNOSIS — M79672 Pain in left foot: Secondary | ICD-10-CM

## 2017-05-26 DIAGNOSIS — M25572 Pain in left ankle and joints of left foot: Secondary | ICD-10-CM | POA: Diagnosis not present

## 2017-05-26 DIAGNOSIS — M7989 Other specified soft tissue disorders: Secondary | ICD-10-CM | POA: Diagnosis not present

## 2017-05-26 DIAGNOSIS — S99922A Unspecified injury of left foot, initial encounter: Secondary | ICD-10-CM | POA: Diagnosis not present

## 2017-05-26 DIAGNOSIS — S99912A Unspecified injury of left ankle, initial encounter: Secondary | ICD-10-CM | POA: Diagnosis not present

## 2017-05-26 NOTE — Progress Notes (Signed)
Dr. Karleen HampshireSpencer T. Laylynn Campanella, MD, CAQ [facility] [address] [place], [id number] Phone: [phone] Fax: [phone]  05/26/2017  Patient: Kathy [name], MRN: [id number], DOB: 03-20-1976, 41 y.o.  Primary Physician:  Lorre MunroeBaity, Regina W, NP   Chief Complaint  Patient presents with  . Fall    in shower  . Ankle Pain    Left   Subjective:   Kathy [name] [name] is a 41 y.o. very pleasant female patient who presents with the following:  L ankle pain:   Inverted her L ankle.  Patient fell in shower and inverted her ankle.  She has had some significant swelling and pain with difficulty ambulating on this since the time of injury.  She has relatively diffuse pain across the entirety of the ankle.  She also has some pain in the foot itself.  There is a lot of swelling currently without currently much significant bruising.  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  Past Medical History, Surgical History, Social History, Family History, Problem List, Medications, and Allergies have been reviewed and updated if relevant.  Patient Active Problem List   Diagnosis Date Noted  . 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 HTN (hypertension) 01/05/2014    Past Medical History:  Diagnosis Date  . Hypertension     Past Surgical History:  Procedure Laterality Date  . WISDOM TOOTH EXTRACTION      Social History   Socioeconomic History  . Marital status: Single    Spouse name: Not on file  . Number of children: Not on file  . Years of education: Not on file  . Highest education level: Not on file  Social Needs  . Financial resource strain: Not on file  . Food insecurity - worry: Not on file  . Food insecurity - inability: Not on file  . Transportation needs - medical: Not on file  . Transportation needs -  non-medical: Not on file  Occupational History  . Not on file  Tobacco Use  . Smoking status: Never Smoker  . Smokeless tobacco: Never Used  Substance and Sexual Activity  . Alcohol use: Yes    Alcohol/week: 0.0 oz    Comment: rare  . Drug use: No  . Sexual activity: Yes    Birth control/protection: Surgical  Other Topics Concern  . Not on file  Social History Narrative  . Not on file    Family History  Problem Relation Age of Onset  . Hypertension Mother   . Diabetes Maternal Aunt     No Known Allergies  Medication list reviewed and updated in full in [facility].  GEN: No fevers, chills. Nontoxic. Primarily MSK c/o today. MSK: Detailed in the HPI GI: tolerating PO intake without difficulty Neuro: No numbness, parasthesias, or tingling associated. Otherwise the pertinent positives of the ROS are noted above.   Objective:   BP 128/80   Pulse 90   Temp 97.6 F (36.4 C) (Oral)   Ht 5' 3.5" (1.613 [name]   Wt 277 lb (125.6 kg)   LMP 05/21/2017   BMI 48.30 kg/[name]    GEN: WDWN, NAD, Non-toxic, Alert & Oriented x 3 HEENT: Atraumatic, Normocephalic.  Ears and Nose: No external deformity. EXTR: No clubbing/cyanosis/edema NEURO: Normal gait. antalgia PSYCH: Normally interactive. Conversant. Not depressed or anxious appearing.  Calm demeanor.  No significant tenderness in the toes.  There is some tenderness at the fifth metatarsal as well as the base of the fifth metatarsal.  All other metatarsals are nontender.  The midfoot is nontender.  Patient has some tenderness on the medial as well as lateral malleolus.  Traumatic tenderness at the ATFL and CFL.  Deltoid ligament is nontender.  Midshaft and proximal fibula as well as tibia are nontender.  Radiology: Dg Ankle Complete Left  Result Date: 05/26/2017 CLINICAL DATA:  Inversion injury of the ankle with persistent pain over the lateral malleolar region. EXAM: LEFT ANKLE COMPLETE - 3+ VIEW COMPARISON:  Left foot  series of today's date FINDINGS: The bones are subjectively adequately mineralized. The ankle joint mortise is preserved. The talar dome is intact. There is no acute malleolar fracture. There is a plantar calcaneal spur. There is diffuse soft tissue swelling about the ankle. There is bony density adjacent to the lateral aspect of the tarsal cuboid that is likely old. IMPRESSION: Diffuse soft tissue swelling about the ankle. No acute fracture nor dislocation. Electronically Signed   By: David  Swazil[place]Jordan [name].D.   On: 05/26/2017 11:25   Dg Foot Complete Left  Result Date: 05/26/2017 CLINICAL DATA:  Pain at the base of the left fifth metatarsal since inversion injury. Initial encounter. EXAM: LEFT FOOT - COMPLETE 3+ VIEW COMPARISON:  None. FINDINGS: There is no evidence of fracture or dislocation. There is no evidence of arthropathy or other focal bone abnormality. Plantar calcaneal spur is noted. Soft tissues are unremarkable. IMPRESSION: No acute abnormality. Plantar calcaneal spur. Electronically Signed   By: Drusilla Kannerhomas  Dalessio [name].D.   On: 05/26/2017 11:26     Assessment and Plan:   Sprain of anterior talofibular ligament of left ankle, initial encounter  Acute left ankle pain - Plan: DG Ankle Complete Left  Acute foot pain, left - Plan: DG Foot Complete Left  Placed the patient in a Aircast.  Aleve twice a day work on basic motion and alpha belt lettering.  Out of work times 7 days.  ATFL and CFL ligament sprain without evidence of fracture on plain films.  Follow-up: Return in about 3 weeks (around 06/16/2017).  Future Appointments  Date Time Provider Department Center  06/16/2017  8:00 AM Demari Gales, Karleen HampshireSpencer, MD [facility]   Orders Placed This Encounter  Procedures  . DG Ankle Complete Left  . DG Foot Complete Left    Signed,  Lada Fulbright T. Lynasia Meloche, MD   Allergies as of 05/26/2017   No Known Allergies     Medication List        Accurate as of 05/26/17  5:30 PM. Always use your most  recent med list.          losartan-hydrochlorothiazide 50-12.5 MG tablet Commonly known as:  HYZAAR Take 2 tablets by mouth daily.   potassium chloride SA 20 MEQ tablet Commonly known as:  K-DUR,KLOR-CON Take 1 tablet (20 mEq total) by mouth 2 (two) times daily.

## 2017-06-16 ENCOUNTER — Ambulatory Visit (INDEPENDENT_AMBULATORY_CARE_PROVIDER_SITE_OTHER): Payer: 59 | Admitting: Family Medicine

## 2017-06-16 ENCOUNTER — Encounter: Payer: Self-pay | Admitting: Family Medicine

## 2017-06-16 ENCOUNTER — Other Ambulatory Visit: Payer: Self-pay

## 2017-06-16 VITALS — BP 120/76 | HR 62 | Temp 98.4°F | Ht 63.5 in | Wt 277.0 lb

## 2017-06-16 DIAGNOSIS — S93492D Sprain of other ligament of left ankle, subsequent encounter: Secondary | ICD-10-CM

## 2017-06-16 NOTE — Progress Notes (Signed)
Dr. Karleen Hampshire T. Ladona Rosten, MD, CAQ [facility] [address] [place], [address] Phone: [phone] Fax: [phone]  06/16/2017  Patient: Kathy [name], MRN: [id number], DOB: 16-Mar-1976, 42 y.o.  Primary Physician:  Lorre Munroe, NP   Chief Complaint  Patient presents with  . Follow-up    Left Ankle/Foot   Subjective:   Kathy [name] is a 41 y.o. very pleasant female patient who presents with the following:  F/u ankle sprain: Very pleasant patient who returns today after follow-up of lateral ankle splint, and she is doing very well.  Previously I placed her on an air cast, took her out of work for 1 week.  She has done very well, and her motion and strength has gotten much better.  Past Medical History, Surgical History, Social History, Family History, Problem List, Medications, and Allergies have been reviewed and updated if relevant.  Patient Active Problem List   Diagnosis Date Noted  . 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 HTN (hypertension) 01/05/2014    Past Medical History:  Diagnosis Date  . Hypertension     Past Surgical History:  Procedure Laterality Date  . WISDOM TOOTH EXTRACTION      Social History   Socioeconomic History  . Marital status: Single    Spouse name: Not on file  . Number of children: Not on file  . Years of education: Not on file  . Highest education level: Not on file  Social Needs  . Financial resource strain: Not on file  . Food insecurity - worry: Not on file  . Food insecurity - inability: Not on file  . Transportation needs - medical: Not on file  . Transportation needs - non-medical: Not on file  Occupational History  . Not on file  Tobacco Use  . Smoking status: Never Smoker  . Smokeless tobacco: Never Used  Substance and Sexual Activity  . Alcohol use: Yes    Alcohol/week: 0.0 oz    Comment: rare  . Drug use: No    . Sexual activity: Yes    Birth control/protection: Surgical  Other Topics Concern  . Not on file  Social History Narrative  . Not on file    Family History  Problem Relation Age of Onset  . Hypertension Mother   . Diabetes Maternal Aunt     No Known Allergies  Medication list reviewed and updated in full in [facility].  GEN: No fevers, chills. Nontoxic. Primarily MSK c/o today. MSK: Detailed in the HPI GI: tolerating PO intake without difficulty Neuro: No numbness, parasthesias, or tingling associated. Otherwise the pertinent positives of the ROS are noted above.   Objective:   BP 120/76   Pulse 62   Temp 98.4 F (36.9 C) (Oral)   Ht 5' 3.5" (1.613 m)   Wt 277 lb (125.6 kg)   LMP 05/21/2017   BMI 48.30 kg/m    GEN: WDWN, NAD, Non-toxic, Alert & Oriented x 3 HEENT: Atraumatic, Normocephalic.  Ears and Nose: No external deformity. EXTR: No clubbing/cyanosis/edema NEURO: Normal gait.  PSYCH: Normally interactive. Conversant. Not depressed or anxious appearing.  Calm demeanor.    Full range of motion at the foot and ankle on the left with no tenderness at any bony prominence and no significant tenderness at primary lateral ankle ligaments.  Strength is 5/5 in all directions and range of motion is normal.  Radiology: Dg Ankle Complete Left  Result Date: 05/26/2017 CLINICAL DATA:  Inversion injury of the ankle with persistent pain over the lateral malleolar region. EXAM: LEFT ANKLE COMPLETE - 3+ VIEW COMPARISON:  Left foot series of today's date FINDINGS: The bones are subjectively adequately mineralized. The ankle joint mortise is preserved. The talar dome is intact. There is no acute malleolar fracture. There is a plantar calcaneal spur. There is diffuse soft tissue swelling about the ankle. There is bony density adjacent to the lateral aspect of the tarsal cuboid that is likely old. IMPRESSION: Diffuse soft tissue swelling about the ankle. No acute fracture nor  dislocation. Electronically Signed   By: David  SwazilandJordan M.D.   On: 05/26/2017 11:25   Dg Foot Complete Left  Result Date: 05/26/2017 CLINICAL DATA:  Pain at the base of the left fifth metatarsal since inversion injury. Initial encounter. EXAM: LEFT FOOT - COMPLETE 3+ VIEW COMPARISON:  None. FINDINGS: There is no evidence of fracture or dislocation. There is no evidence of arthropathy or other focal bone abnormality. Plantar calcaneal spur is noted. Soft tissues are unremarkable. IMPRESSION: No acute abnormality. Plantar calcaneal spur. Electronically Signed   By: Drusilla Kannerhomas  Dalessio M.D.   On: 05/26/2017 11:26    Assessment and Plan:   Sprain of anterior talofibular ligament of left ankle, subsequent encounter  Mild residual stiffness, but otherwise doing well.  Follow-up as needed.  Continue with basic range of motion at home.  Follow-up: No Follow-up on file.  Signed,  Elpidio GaleaSpencer T. Karlin Heilman, MD   Allergies as of 06/16/2017   No Known Allergies     Medication List        Accurate as of 06/16/17  8:26 AM. Always use your most recent med list.          losartan-hydrochlorothiazide 50-12.5 MG tablet Commonly known as:  HYZAAR Take 2 tablets by mouth daily.   potassium chloride SA 20 MEQ tablet Commonly known as:  K-DUR,KLOR-CON Take 1 tablet (20 mEq total) by mouth 2 (two) times daily.

## 2017-07-27 ENCOUNTER — Encounter: Payer: Self-pay | Admitting: Internal Medicine

## 2017-07-27 ENCOUNTER — Ambulatory Visit (INDEPENDENT_AMBULATORY_CARE_PROVIDER_SITE_OTHER): Payer: 59 | Admitting: Internal Medicine

## 2017-07-27 VITALS — BP 128/86 | HR 76 | Temp 98.8°F | Wt 276.0 lb

## 2017-07-27 DIAGNOSIS — J329 Chronic sinusitis, unspecified: Secondary | ICD-10-CM

## 2017-07-27 DIAGNOSIS — B9789 Other viral agents as the cause of diseases classified elsewhere: Secondary | ICD-10-CM | POA: Diagnosis not present

## 2017-07-27 MED ORDER — HYDROCODONE-HOMATROPINE 5-1.5 MG/5ML PO SYRP: 5.0000 mL | ORAL_SOLUTION | Freq: Three times a day (TID) | ORAL | 0 refills | Status: DC | PRN

## 2017-07-27 NOTE — Progress Notes (Signed)
HPI  Pt presents to the clinic today with c/o headache, nasal congestion, ear fullness and cough. This started 2-3 days ago. She describes the headache as pressure. She denies facial pain. She is not able to blow anything out of her nose. She denies ear pain or decreased hearing. The cough is productive of white mucous. She denies fever, chills or body aches. She has tried Benadryl, Mucinex and Nyquil with minimal relief. She has not had sick contacts.   Review of Systems     Past Medical History:  Diagnosis Date  . Hypertension     Family History  Problem Relation Age of Onset  . Hypertension Mother   . Diabetes Maternal Aunt     Social History   Socioeconomic History  . Marital status: Single    Spouse name: Not on file  . Number of children: Not on file  . Years of education: Not on file  . Highest education level: Not on file  Social Needs  . Financial resource strain: Not on file  . Food insecurity - worry: Not on file  . Food insecurity - inability: Not on file  . Transportation needs - medical: Not on file  . Transportation needs - non-medical: Not on file  Occupational History  . Not on file  Tobacco Use  . Smoking status: Never Smoker  . Smokeless tobacco: Never Used  Substance and Sexual Activity  . Alcohol use: Yes    Alcohol/week: 0.0 oz    Comment: rare  . Drug use: No  . Sexual activity: Yes    Birth control/protection: Surgical  Other Topics Concern  . Not on file  Social History Narrative  . Not on file    No Known Allergies   Constitutional: Positive headache. Denies fatigue, fever or abrupt weight changes.  HEENT:  Positive eye pain, nasal congestion. Denies eye redness, ear pain, ringing in the ears, wax buildup, runny nose or sore throat. Respiratory: Positive cough. Denies difficulty breathing or shortness of breath.  Cardiovascular: Denies chest pain, chest tightness, palpitations or swelling in the hands or feet.   No other specific  complaints in a complete review of systems (except as listed in HPI above).  Objective:   BP 128/86   Pulse 76   Temp 98.8 F (37.1 C) (Oral)   Wt 276 lb (125.2 kg)   SpO2 98%   BMI 48.12 kg/m   General: Appears her stated age, in NAD. HEENT: Head: normal shape and size, no sinus tenderness noted; Eyes: sclera white, no icterus, conjunctiva pink; Ears: Tm's gray and intact, normal light reflex; Nose: mucosa boggy and moist, septum midline; Throat/Mouth: + PND. Teeth present, mucosa pink and moist, no exudate noted, no lesions or ulcerations noted.  Neck:  No adenopathy noted.  Pulmonary/Chest: Normal effort and positive vesicular breath sounds. No respiratory distress. No wheezes, rales or ronchi noted.       Assessment & Plan:   Viral Sinusitis:  Can use a Neti Pot which can be purchased from your local drug store. Flonase 2 sprays each nostril for 3 days and then as needed. Start Zyrtec OTC 80 mg Depo IM today Work note provided  RTC as needed or if symptoms persist. Make an appt for your annual exam Nicki ReaperBAITY, REGINA, NP

## 2017-07-27 NOTE — Patient Instructions (Signed)

## 2017-07-28 MED ORDER — METHYLPREDNISOLONE ACETATE 80 MG/ML IJ SUSP
80.0000 mg | Freq: Once | INTRAMUSCULAR | Status: AC
  Administered 2017-07-27: 80 mg via INTRAMUSCULAR

## 2017-07-28 NOTE — Addendum Note (Signed)
Addended by: Roena MaladyEVONTENNO, MELANIE Y on: 07/28/2017 04:41 PM   Modules accepted: Orders

## 2017-10-04 ENCOUNTER — Other Ambulatory Visit (HOSPITAL_COMMUNITY)
Admission: RE | Admit: 2017-10-04 | Discharge: 2017-10-04 | Disposition: A | Discharge status: Home / Self Care | Payer: 59 | Source: Ambulatory Visit | Attending: Internal Medicine | Admitting: Internal Medicine

## 2017-10-04 ENCOUNTER — Other Ambulatory Visit: Payer: Self-pay | Admitting: Internal Medicine

## 2017-10-04 ENCOUNTER — Ambulatory Visit (INDEPENDENT_AMBULATORY_CARE_PROVIDER_SITE_OTHER): Payer: 59 | Admitting: Internal Medicine

## 2017-10-04 ENCOUNTER — Encounter: Payer: Self-pay | Admitting: Internal Medicine

## 2017-10-04 VITALS — BP 126/86 | HR 57 | Temp 98.3°F | Ht 63.5 in | Wt 271.0 lb

## 2017-10-04 DIAGNOSIS — T502X5A Adverse effect of carbonic-anhydrase inhibitors, benzothiadiazides and other diuretics, initial encounter: Secondary | ICD-10-CM

## 2017-10-04 DIAGNOSIS — Z1231 Encounter for screening mammogram for malignant neoplasm of breast: Secondary | ICD-10-CM | POA: Diagnosis not present

## 2017-10-04 DIAGNOSIS — Z124 Encounter for screening for malignant neoplasm of cervix: Secondary | ICD-10-CM | POA: Insufficient documentation

## 2017-10-04 DIAGNOSIS — B9689 Other specified bacterial agents as the cause of diseases classified elsewhere: Secondary | ICD-10-CM | POA: Insufficient documentation

## 2017-10-04 DIAGNOSIS — I10 Essential (primary) hypertension: Secondary | ICD-10-CM

## 2017-10-04 DIAGNOSIS — Z0001 Encounter for general adult medical examination with abnormal findings: Secondary | ICD-10-CM

## 2017-10-04 DIAGNOSIS — E559 Vitamin D deficiency, unspecified: Secondary | ICD-10-CM

## 2017-10-04 DIAGNOSIS — E876 Hypokalemia: Secondary | ICD-10-CM | POA: Diagnosis not present

## 2017-10-04 DIAGNOSIS — N760 Acute vaginitis: Secondary | ICD-10-CM | POA: Insufficient documentation

## 2017-10-04 DIAGNOSIS — R7303 Prediabetes: Secondary | ICD-10-CM

## 2017-10-04 DIAGNOSIS — Z1239 Encounter for other screening for malignant neoplasm of breast: Secondary | ICD-10-CM

## 2017-10-04 LAB — COMPREHENSIVE METABOLIC PANEL
ALT: 13 U/L (ref 0–35)
AST: 12 U/L (ref 0–37)
Albumin: 3.4 g/dL — ABNORMAL LOW (ref 3.5–5.2)
Alkaline Phosphatase: 71 U/L (ref 39–117)
BUN: 9 mg/dL (ref 6–23)
CHLORIDE: 102 meq/L (ref 96–112)
CO2: 28 meq/L (ref 19–32)
Calcium: 8.7 mg/dL (ref 8.4–10.5)
Creatinine, Ser: 0.63 mg/dL (ref 0.40–1.20)
GFR: 134.53 mL/min (ref >60.00)
GLUCOSE: 128 mg/dL — AB (ref 70–99)
POTASSIUM: 3.5 meq/L (ref 3.5–5.1)
SODIUM: 137 meq/L (ref 135–145)
Total Bilirubin: 0.4 mg/dL (ref 0.2–1.2)
Total Protein: 7.4 g/dL (ref 6.0–8.3)

## 2017-10-04 LAB — CBC
HEMATOCRIT: 37.3 % (ref 36.0–46.0)
Hemoglobin: 12.2 g/dL (ref 12.0–15.0)
MCHC: 32.8 g/dL (ref 30.0–36.0)
MCV: 79.9 fl (ref 78.0–100.0)
Platelets: 242 10*3/uL (ref 150.0–400.0)
RBC: 4.66 Mil/uL (ref 3.87–5.11)
RDW: 19.3 % — AB (ref 11.5–15.5)
WBC: 4.3 10*3/uL (ref 4.0–10.5)

## 2017-10-04 LAB — LIPID PANEL
Cholesterol: 119 mg/dL (ref 0–200)
Triglycerides: 138 mg/dL (ref 0.0–149.0)
VLDL: 27.6 mg/dL (ref 0.0–40.0)

## 2017-10-04 LAB — VITAMIN D 25 HYDROXY (VIT D DEFICIENCY, FRACTURES): VITD: 9.71 ng/mL — AB (ref 30.00–100.00)

## 2017-10-04 LAB — HEMOGLOBIN A1C: HEMOGLOBIN A1C: 6.2 % (ref 4.6–6.5)

## 2017-10-04 MED ORDER — POTASSIUM CHLORIDE CRYS ER 20 MEQ PO TBCR: 20.0000 meq | EXTENDED_RELEASE_TABLET | Freq: Two times a day (BID) | ORAL | 3 refills | Status: DC

## 2017-10-04 MED ORDER — VITAMIN D (ERGOCALCIFEROL) 1.25 MG (50000 UNIT) PO CAPS: 50000.0000 [IU] | ORAL_CAPSULE | ORAL | 0 refills | Status: DC

## 2017-10-04 MED ORDER — LOSARTAN POTASSIUM-HCTZ 50-12.5 MG PO TABS: 2.0000 | ORAL_TABLET | Freq: Every day | ORAL | 3 refills | Status: DC

## 2017-10-04 NOTE — Assessment & Plan Note (Signed)
CMET today Reinforced DASH diet and exercise for weight loss Continue Losartan HCT and Potassiu,- refilled today

## 2017-10-04 NOTE — Assessment & Plan Note (Signed)
A1C today No microalbumin secondary to ARB therapy Encouraged her to consume a balanced diet and exercise regimen

## 2017-10-04 NOTE — Addendum Note (Signed)
Addended by: Roena Malady on: 10/04/2017 10:28 AM   Modules accepted: Orders

## 2017-10-04 NOTE — Progress Notes (Signed)
Subjective:    Patient ID: Kathy [name], female    DOB: 1976-04-11, 42 y.o.   MRN: [id number]  HPI   Pt presents to the clinic today for her annual exam. She is also due to follow up chronic conditions.   HTN: Her BP today is 126/86. She is taking Losartan HCT and Potassium as prescribed. ECG from 01/2015 reviewed.   Borderline Diabetes: Her last A1C was 6.2%, 04/2016. She is not taking any diabetic medication at this time. She does not check her sugars.  Flu: 03/2017 Tetanus: 04/2016 Pap Smear: 01/2015 Mammogram: never Vision Screening: annually Dentist: biannually  Diet: She does eat meat. She consumes few fruits and veggies. She does eat fried foods. She drinks mostly juice and water. Exercise: Walking for 1 hour 4 days per week  Review of Systems      Past Medical History:  Diagnosis Date  . Hypertension     Current Outpatient Medications  Medication Sig Dispense Refill  . losartan-hydrochlorothiazide (HYZAAR) 50-12.5 MG tablet Take 2 tablets by mouth daily. 180 tablet 1  . potassium chloride SA (K-DUR,KLOR-CON) 20 MEQ tablet Take 1 tablet (20 mEq total) by mouth 2 (two) times daily. 60 tablet 2   No current facility-administered medications for this visit.     No Known Allergies  Family History  Problem Relation Age of Onset  . Hypertension Mother   . Diabetes Maternal Aunt     Social History   Socioeconomic History  . Marital status: Single    Spouse name: Not on file  . Number of children: Not on file  . Years of education: Not on file  . Highest education level: Not on file  Occupational History  . Not on file  Social Needs  . Financial resource strain: Not on file  . Food insecurity:    Worry: Not on file    Inability: Not on file  . Transportation needs:    Medical: Not on file    Non-medical: Not on file  Tobacco Use  . Smoking status: Never Smoker  . Smokeless tobacco: Never Used  Substance and Sexual Activity  . Alcohol use:  Yes    Alcohol/week: 0.0 oz    Comment: rare  . Drug use: No  . Sexual activity: Yes    Birth control/protection: Surgical  Lifestyle  . Physical activity:    Days per week: Not on file    Minutes per session: Not on file  . Stress: Not on file  Relationships  . Social connections:    Talks on phone: Not on file    Gets together: Not on file    Attends religious service: Not on file    Active member of club or organization: Not on file    Attends meetings of clubs or organizations: Not on file    Relationship status: Not on file  . Intimate partner violence:    Fear of current or ex partner: Not on file    Emotionally abused: Not on file    Physically abused: Not on file    Forced sexual activity: Not on file  Other Topics Concern  . Not on file  Social History Narrative  . Not on file     Constitutional: Denies fever, malaise, fatigue, headache or abrupt weight changes.  HEENT: Denies eye pain, eye redness, ear pain, ringing in the ears, wax buildup, runny nose, nasal congestion, bloody nose, or sore throat. Respiratory: Denies difficulty breathing, shortness of breath, cough or  sputum production.   Cardiovascular: Denies chest pain, chest tightness, palpitations or swelling in the hands or feet.  Gastrointestinal: Denies abdominal pain, bloating, constipation, diarrhea or blood in the stool.  GU: Denies urgency, frequency, pain with urination, burning sensation, blood in urine, odor or discharge. Musculoskeletal: Pt reports intermittent left thumb pain. Denies decrease in range of motion, difficulty with gait, muscle pain or joint swelling.  Skin: Denies redness, rashes, lesions or ulcercations.  Neurological: Denies dizziness, difficulty with memory, difficulty with speech or problems with balance and coordination.  Psych: Denies anxiety, depression, SI/HI.  No other specific complaints in a complete review of systems (except as listed in HPI above).  Objective:    Physical Exam   BP 126/86   Pulse (!) 57   Temp 98.3 F (36.8 C) (Oral)   Ht 5' 3.5" (1.613 m)   Wt 271 lb (122.9 kg)   LMP 08/19/2017 Comment: irregular  SpO2 99%   BMI 47.25 kg/m  Wt Readings from Last 3 Encounters:  10/04/17 271 lb (122.9 kg)  07/27/17 276 lb (125.2 kg)  06/16/17 277 lb (125.6 kg)    [name]: Appears her stated age, obese, in NAD. Skin: Warm, dry and intact. Acanthos nigracans noted. HEENT: Head: normal shape and size; Eyes: sclera white, no icterus, conjunctiva pink, PERRLA and EOMs intact; Throat/Mouth: Teeth present, mucosa pink and moist, no exudate, lesions or ulcerations noted.  Neck:  Neck supple, trachea midline. No masses, lumps or thyromegaly present.  Cardiovascular: Normal rate and rhythm. S1,S2 noted.  No murmur, rubs or gallops noted. No JVD or BLE edema.  Pulmonary/Chest: Normal effort and positive vesicular breath sounds. No respiratory distress. No wheezes, rales or ronchi noted.  Abdomen: Soft and nontender. Normal bowel sounds. No distention or masses noted.  Pelvic: Normal female anatomy. Cervix without changes. No discharge noted. Adnexa nonpalpable.  Musculoskeletal: Strength 5/5 BUE/BLE. No difficulty with gait.  Neurological: Alert and oriented. Cranial nerves II-XII grossly intact. Coordination normal.  Psychiatric: Mood and affect normal. Behavior is normal. Judgment and thought content normal.     BMET    Component Value Date/Time   NA 135 03/11/2017 0847   K 3.9 03/11/2017 0847   CL 100 03/11/2017 0847   CO2 29 03/11/2017 0847   GLUCOSE 126 (H) 03/11/2017 0847   BUN 7 03/11/2017 0847   CREATININE 0.65 03/11/2017 0847   CREATININE 0.99 01/05/2014 1353   CALCIUM 9.1 03/11/2017 0847    Lipid Panel     Component Value Date/Time   CHOL 125 10/23/2015 1017   TRIG 101.0 10/23/2015 1017   HDL 34.50 (L) 10/23/2015 1017   CHOLHDL 4 10/23/2015 1017   VLDL 20.2 10/23/2015 1017   LDLCALC 70 10/23/2015 1017    CBC      Component Value Date/Time   WBC 4.8 10/23/2015 1017   RBC 4.59 10/23/2015 1017   HGB 11.8 (L) 10/23/2015 1017   HCT 36.1 10/23/2015 1017   PLT 256.0 10/23/2015 1017   MCV 78.7 10/23/2015 1017   MCH 27.9 01/05/2014 1353   MCHC 32.6 10/23/2015 1017   RDW 18.8 (H) 10/23/2015 1017    Hgb A1C Lab Results  Component Value Date   HGBA1C 6.2 04/08/2016           Assessment & Plan:   Preventative Health Maintenance:  Encouraged her to get a flu shot in the fall Tetanus UTD Pap smear done with STD screening Mammogram ordered, she will call [facility] to schedule, number provided Encouraged  her to consume a balanced diet and exercise regimen Advised her to see an eye doctor and dentist annually Will check CBC, CMET, Lipid, A1C and Vit D today  RTC in 1 year, sooner if needed Nicki Reaper, NP

## 2017-10-04 NOTE — Assessment & Plan Note (Signed)
CMET today Continue Potassium for now, will adjust if needed based on labs

## 2017-10-04 NOTE — Patient Instructions (Signed)

## 2017-10-05 LAB — CYTOLOGY - PAP
BACTERIAL VAGINITIS: POSITIVE — AB
Candida vaginitis: NEGATIVE
Chlamydia: NEGATIVE
DIAGNOSIS: NEGATIVE
Neisseria Gonorrhea: NEGATIVE
Trichomonas: NEGATIVE

## 2017-10-07 NOTE — Addendum Note (Signed)
Addended by: Roena Malady on: 10/07/2017 09:59 AM   Modules accepted: Orders

## 2018-02-23 ENCOUNTER — Ambulatory Visit
Admission: RE | Admit: 2018-02-23 | Discharge: 2018-02-23 | Disposition: A | Discharge status: Home / Self Care | Payer: 59 | Source: Ambulatory Visit | Attending: Internal Medicine | Admitting: Internal Medicine

## 2018-02-23 DIAGNOSIS — Z1231 Encounter for screening mammogram for malignant neoplasm of breast: Secondary | ICD-10-CM | POA: Diagnosis not present

## 2018-02-23 DIAGNOSIS — Z1239 Encounter for other screening for malignant neoplasm of breast: Secondary | ICD-10-CM

## 2018-05-04 ENCOUNTER — Encounter: Payer: Self-pay | Admitting: Internal Medicine

## 2018-05-04 ENCOUNTER — Ambulatory Visit (INDEPENDENT_AMBULATORY_CARE_PROVIDER_SITE_OTHER): Payer: 59 | Admitting: Internal Medicine

## 2018-05-04 VITALS — BP 128/86 | HR 69 | Temp 98.4°F | Wt 277.0 lb

## 2018-05-04 DIAGNOSIS — J302 Other seasonal allergic rhinitis: Secondary | ICD-10-CM

## 2018-05-04 MED ORDER — FLUTICASONE PROPIONATE 50 MCG/ACT NA SUSP: 2.0000 | Freq: Every day | NASAL | 1 refills | Status: DC

## 2018-05-04 MED ORDER — METHYLPREDNISOLONE ACETATE 80 MG/ML IJ SUSP
80.0000 mg | Freq: Once | INTRAMUSCULAR | Status: AC
  Administered 2018-05-04: 80 mg via INTRAMUSCULAR

## 2018-05-04 NOTE — Addendum Note (Signed)
Addended by: Roena MaladyEVONTENNO, Camylle Whicker Y on: 05/04/2018 02:47 PM   Modules accepted: Orders

## 2018-05-04 NOTE — Progress Notes (Signed)
HPI  Pt presents to the clinic today with c/o headache, facial pain and pressure, post nasal drip and ear fullness. This started 2 days ago. She describes the headache as pressure in the forehead. She is blowing clear/yellow mucous out of her nose. She denies ear pain, drainage or decreased hearing. She denies fever, chills or body aches. She has tried Theraflu with minimal relief. She has no history of allergies. She has not had sick contacts.  Review of Systems     Past Medical History:  Diagnosis Date  . Hypertension     Family History  Problem Relation Age of Onset  . Hypertension Mother   . Diabetes Maternal Aunt     Social History   Socioeconomic History  . Marital status: Single    Spouse name: Not on file  . Number of children: Not on file  . Years of education: Not on file  . Highest education level: Not on file  Occupational History  . Not on file  Social Needs  . Financial resource strain: Not on file  . Food insecurity:    Worry: Not on file    Inability: Not on file  . Transportation needs:    Medical: Not on file    Non-medical: Not on file  Tobacco Use  . Smoking status: Never Smoker  . Smokeless tobacco: Never Used  Substance and Sexual Activity  . Alcohol use: Yes    Alcohol/week: 0.0 standard drinks    Comment: rare  . Drug use: No  . Sexual activity: Yes    Birth control/protection: Surgical  Lifestyle  . Physical activity:    Days per week: Not on file    Minutes per session: Not on file  . Stress: Not on file  Relationships  . Social connections:    Talks on phone: Not on file    Gets together: Not on file    Attends religious service: Not on file    Active member of club or organization: Not on file    Attends meetings of clubs or organizations: Not on file    Relationship status: Not on file  . Intimate partner violence:    Fear of current or ex partner: Not on file    Emotionally abused: Not on file    Physically abused: Not on  file    Forced sexual activity: Not on file  Other Topics Concern  . Not on file  Social History Narrative  . Not on file    No Known Allergies   Constitutional: Positive headache. Denies fatigue, fever or  abrupt weight changes.  HEENT:  Positive facial pain, nasal congestion and ear fullness. Denies eye redness, ear pain, ringing in the ears, wax buildup, runny nose or sore throat. Respiratory:  Denies cough, difficulty breathing or shortness of breath.  Cardiovascular: Denies chest pain, chest tightness, palpitations or swelling in the hands or feet.   No other specific complaints in a complete review of systems (except as listed in HPI above).  Objective:   BP 128/86   Pulse 69   Temp 98.4 F (36.9 C) (Oral)   Wt 277 lb (125.6 kg)   LMP 02/27/2018 Comment: irregular  SpO2 98%   BMI 48.30 kg/m   General: Appears her stated age, obese in NAD. HEENT: Head: normal shape and size, no sinus tenderness noted;  Ears: Tm's gray and intact, normal light reflex; Nose: mucosa boggy and moist, turbinates swollen; Throat/Mouth: + PND. Teeth present, mucosa pink and moist,  no exudate noted, no lesions or ulcerations noted.  Neck:  No adenopathy noted.  Pulmonary/Chest: Normal effort and positive vesicular breath sounds. No respiratory distress. No wheezes, rales or ronchi noted.       Assessment & Plan:   Allergic Rhinitis:  Can use a Neti Pot which can be purchased from your local drug store. Flonase 2 sprays each nostril for 3 days and then as needed. Start Zyrtec OTC 80 mg Depo IM today  RTC as needed or if symptoms persist. Nicki Reaper, NP

## 2018-05-04 NOTE — Patient Instructions (Signed)

## 2018-05-04 NOTE — Addendum Note (Signed)
Addended by: Roena MaladyEVONTENNO, Bassel Gaskill Y on: 05/04/2018 02:10 PM   Modules accepted: Orders

## 2018-12-21 ENCOUNTER — Encounter: Payer: 59 | Admitting: Internal Medicine

## 2018-12-21 ENCOUNTER — Other Ambulatory Visit: Payer: Self-pay

## 2018-12-21 ENCOUNTER — Ambulatory Visit (INDEPENDENT_AMBULATORY_CARE_PROVIDER_SITE_OTHER): Payer: 59 | Admitting: Internal Medicine

## 2018-12-21 ENCOUNTER — Encounter: Payer: Self-pay | Admitting: Internal Medicine

## 2018-12-21 VITALS — BP 142/90 | HR 69 | Temp 99.2°F | Ht 63.5 in | Wt 271.0 lb

## 2018-12-21 DIAGNOSIS — Z0000 Encounter for general adult medical examination without abnormal findings: Secondary | ICD-10-CM | POA: Diagnosis not present

## 2018-12-21 DIAGNOSIS — R7303 Prediabetes: Secondary | ICD-10-CM

## 2018-12-21 DIAGNOSIS — I10 Essential (primary) hypertension: Secondary | ICD-10-CM | POA: Diagnosis not present

## 2018-12-21 DIAGNOSIS — T502X5A Adverse effect of carbonic-anhydrase inhibitors, benzothiadiazides and other diuretics, initial encounter: Secondary | ICD-10-CM

## 2018-12-21 DIAGNOSIS — E876 Hypokalemia: Secondary | ICD-10-CM

## 2018-12-21 MED ORDER — LOSARTAN POTASSIUM-HCTZ 50-12.5 MG PO TABS: 2.0000 | ORAL_TABLET | Freq: Every day | ORAL | 0 refills | Status: DC

## 2018-12-21 NOTE — Assessment & Plan Note (Addendum)
A1C, CMP checked today.   Enforced the importance of eating a low carb diet as well as exercising to lose weight.

## 2018-12-21 NOTE — Patient Instructions (Signed)
Hipertensin en los adultos Hypertension, Adult El trmino hipertensin es otra forma de denominar a la presin arterial elevada. La presin arterial elevada fuerza al corazn a trabajar ms para bombear la sangre. Esto puede causar problemas con el paso del tiempo. Una lectura de presin arterial est compuesta por 2 nmeros. Hay un nmero superior (sistlico) sobre un nmero inferior (diastlico). Lo ideal es tener la presin arterial por debajo de 120/80. Las elecciones saludables pueden ayudar a bajar la presin arterial, o tal vez necesite medicamentos para bajarla. Cules son las causas? Se desconoce la causa de esta afeccin. Algunas afecciones pueden estar relacionadas con la presin arterial alta. Qu incrementa el riesgo?  Fumar.  Tener diabetes mellitus tipo 2, colesterol alto, o ambos.  No hacer la cantidad suficiente de actividad fsica o ejercicio.  Tener sobrepeso.  Consumir mucha grasa, azcar, caloras o sal (sodio) en su dieta.  Beber alcohol en exceso.  Tener una enfermedad renal a largo plazo (crnica).  Tener antecedentes familiares de presin arterial alta.  Edad. Los riesgos aumentan con la edad.  Raza. El riesgo es mayor para las personas afroamericanas.  Sexo. Antes de los 45aos, los hombres corren ms riesgo que las mujeres. Despus de los 65aos, las mujeres corren ms riesgo que los hombres.  Tener apnea obstructiva del sueo.  Estrs. Cules son los signos o los sntomas?  Es posible que la presin arterial alta puede no cause sntomas. La presin arterial muy alta (crisis hipertensiva) puede provocar: ? Dolor de cabeza. ? Sensaciones de preocupacin o nerviosismo (ansiedad). ? Falta de aire. ? Hemorragia nasal. ? Sensacin de malestar en el estmago (nuseas). ? Vmitos. ? Cambios en la forma de ver. ? Dolor muy intenso en el pecho. ? Convulsiones. Cmo se trata?  Esta afeccin se trata haciendo cambios saludables en el estilo de  vida, por ejemplo: ? Consumir alimentos saludables. ? Hacer ms ejercicio. ? Beber menos alcohol.  El mdico puede recetarle medicamentos si los cambios en el estilo de vida no son suficientes para lograr controlar la presin arterial y si: ? El nmero de arriba est por encima de 130. ? El nmero de abajo est por encima de 80.  Su presin arterial personal ideal puede variar. Siga estas instrucciones en su casa: Comida y bebida   Si se lo dicen, siga el plan de alimentacin de DASH (Dietary Approaches to Stop Hypertension, Maneras de alimentarse para detener la hipertensin). Para seguir este plan: ? Llene la mitad del plato de cada comida con frutas y verduras. ? Llene un cuarto del plato de cada comida con cereales integrales. Los cereales integrales incluyen pasta integral, arroz integral y pan integral. ? Coma y beba productos lcteos con bajo contenido de grasa, como leche descremada o yogur bajo en grasas. ? Llene un cuarto del plato de cada comida con protenas bajas en grasa (magras). Las protenas bajas en grasa incluyen pescado, pollo sin piel, huevos, frijoles y tofu. ? Evite consumir carne grasa, carne curada y procesada, o pollo con piel. ? Evite consumir alimentos prehechos o procesados.  Consuma menos de 1500 mg de sal por da.  No beba alcohol si: ? El mdico le indica que no lo haga. ? Est embarazada, puede estar embarazada o est tratando de quedar embarazada.  Si bebe alcohol: ? Limite la cantidad que bebe a lo siguiente:  De 0 a 1 medida por da para las mujeres.  De 0 a 2 medidas por da para los hombres. ? Est atento a   la cantidad de alcohol que hay en las bebidas que toma. En [place], una medida equivale a una botella de cerveza de 12oz (355ml), un vaso de vino de 5oz (148ml) o un vaso de una bebida alcohlica de alta graduacin de 1oz (44ml). Estilo de vida   Trabaje con su mdico para mantenerse en un peso saludable o para perder  peso. Pregntele a su mdico cul es el peso recomendable para usted.  Haga al menos 30minutos de ejercicio la mayora de los das de la semana. Estos pueden incluir caminar, nadar o andar en bicicleta.  Realice al menos 30 minutos de ejercicio que fortalezca sus msculos (ejercicios de resistencia) al menos 3 das a la semana. Estos pueden incluir levantar pesas o hacer Pilates.  No consuma ningn producto que contenga nicotina o tabaco, como cigarrillos, cigarrillos electrnicos y tabaco de mascar. Si necesita ayuda para dejar de fumar, consulte al mdico.  Controle su presin arterial en su casa tal como le indic el mdico.  Concurra a todas las visitas de seguimiento como se lo haya indicado el mdico. Esto es importante. Medicamentos  Tome los medicamentos de venta libre y los recetados solamente como se lo haya indicado el mdico. Siga cuidadosamente las indicaciones.  No omita las dosis de medicamentos para la presin arterial. Los medicamentos pierden eficacia si omite dosis. El hecho de omitir las dosis tambin aumenta el riesgo de otros problemas.  Pregntele a su mdico a qu efectos secundarios o reacciones a los medicamentos debe prestar atencin. Comunquese con un mdico si:  Piensa que tiene una reaccin a los medicamentos que est tomando.  Tiene dolores de cabeza frecuentes (recurrentes).  Se siente mareado.  Tiene hinchazn en los tobillos.  Tiene problemas de visin. Solicite ayuda inmediatamente si:  Siente un dolor de cabeza muy intenso.  Empieza a sentirse desorientado (confundido).  Se siente dbil o adormecido.  Siente que va a desmayarse.  Tiene un dolor muy intenso en las siguientes zonas: ? Pecho. ? Vientre (abdomen).  Vomita ms de una vez.  Tiene dificultad para respirar. Resumen  El trmino hipertensin es otra forma de denominar a la presin arterial elevada.  La presin arterial elevada fuerza al corazn a trabajar ms para bombear  la sangre.  Para la mayora de las personas, una presin arterial normal es menor que 120/80.  Las decisiones saludables pueden ayudarle a disminuir su presin arterial. Si no puede bajar su presin arterial mediante decisiones saludables, es posible que deba tomar medicamentos. Esta informacin no tiene como fin reemplazar el consejo del mdico. Asegrese de hacerle al mdico cualquier pregunta que tenga. Document Released: 11/12/2009 Document Revised: 03/10/2018 Document Reviewed: 03/10/2018 Elsevier Patient Education  2020 [employer]  

## 2018-12-21 NOTE — Assessment & Plan Note (Addendum)
Patient noncompliant with medications.   Encouraged patient to restart her Losartan/HCTZ as prescribed and return to the office in 3 weeks for BP recheck.  Discussed importance of DASH diet and increasing exercise. CMP

## 2018-12-21 NOTE — Assessment & Plan Note (Addendum)
Not currently taking BP meds or potassium supplementation.   CMP checked today

## 2018-12-21 NOTE — Progress Notes (Signed)
Subjective:    Patient ID: Kathy [name], female    DOB: [birth year], 43 y.o.   MRN: [id number]  HPI  Patient presents today for her annual physical examination.  She is also due for follow up of her chronic conditions.  HTN:  Her BP today is 142/90.  She is not currently taking her Losartan/HCTZ or Potassium as prescribed.  She denies any CP, SOB, palpitations.  ECG from 2016 reviewed.  Pre-DM:  Her last A1C was 6.2 on 09/2017.  She is not currently taking any medications for this.  She does not check her sugars.   Vitamin D Deficiency:  Last Vitamin D level was 9.71 on 09/2017.  Was prescribed Ergocalciferol.  [facility] Tetanus:04/2016 Pap Smear: 09/2017 Mammogram: 02/2018 Vision Screening: annually Dentist: biannually  Diet: She does eat meat. She consumes some fruits and veggies daily. She does eat some fried foods. She drinks mostly water. Exercise: None  Review of Systems  Past Medical History:  Diagnosis Date  . Hypertension     Current Outpatient Medications  Medication Sig Dispense Refill  . fluticasone (FLONASE) 50 MCG/ACT nasal spray Place 2 sprays into both nostrils daily. 16 g 1  . losartan-hydrochlorothiazide (HYZAAR) 50-12.5 MG tablet Take 2 tablets by mouth daily. 180 tablet 3  . potassium chloride SA (K-DUR,KLOR-CON) 20 MEQ tablet Take 1 tablet (20 mEq total) by mouth 2 (two) times daily. 180 tablet 3   No current facility-administered medications for this visit.     No Known Allergies  Family History  Problem Relation Age of Onset  . Hypertension Mother   . Diabetes Maternal Aunt     Social History   Socioeconomic History  . Marital status: Single    Spouse name: Not on file  . Number of children: Not on file  . Years of education: Not on file  . Highest education level: Not on file  Occupational History  . Not on file  Social Needs  . Financial resource strain: Not on file  . Food insecurity    Worry: Not on file   Inability: Not on file  . Transportation needs    Medical: Not on file    Non-medical: Not on file  Tobacco Use  . Smoking status: Never Smoker  . Smokeless tobacco: Never Used  Substance and Sexual Activity  . Alcohol use: Yes    Alcohol/week: 0.0 standard drinks    Comment: rare  . Drug use: No  . Sexual activity: Yes    Birth control/protection: Surgical  Lifestyle  . Physical activity    Days per week: Not on file    Minutes per session: Not on file  . Stress: Not on file  Relationships  . Social Musicianconnections    Talks on phone: Not on file    Gets together: Not on file    Attends religious service: Not on file    Active member of club or organization: Not on file    Attends meetings of clubs or organizations: Not on file    Relationship status: Not on file  . Intimate partner violence    Fear of current or ex partner: Not on file    Emotionally abused: Not on file    Physically abused: Not on file    Forced sexual activity: Not on file  Other Topics Concern  . Not on file  Social History Narrative  . Not on file     Constitutional: Denies fever, malaise, fatigue, headache or  abrupt weight changes.  HEENT: Denies eye pain, eye redness, ear pain, ringing in the ears, wax buildup, runny nose, nasal congestion, bloody nose, or sore throat. Respiratory: Denies difficulty breathing, shortness of breath, cough or sputum production.   Cardiovascular: Denies chest pain, chest tightness, palpitations or swelling in the hands or feet.  Gastrointestinal: Denies abdominal pain, bloating, constipation, diarrhea or blood in the stool.  GU: Pt reports irregular periods. Denies urgency, frequency, pain with urination, burning sensation, blood in urine, odor or discharge. Musculoskeletal: Denies decrease in range of motion, difficulty with gait, muscle pain or joint pain and swelling.  Skin: Denies redness, rashes, lesions or ulcercations.  Neurological: Denies dizziness, difficulty  with memory, difficulty with speech or problems with balance and coordination.  Psych: Denies anxiety, depression, SI/HI.  No other specific complaints in a complete review of systems (except as listed in HPI above).     Objective:   Physical Exam   Wt Readings from Last 3 Encounters:  05/04/18 277 lb (125.6 kg)  10/04/17 271 lb (122.9 kg)  07/27/17 276 lb (125.2 kg)    General: Appears her stated age, obese, in NAD. Skin: Warm, dry and intact. No rashes, ulcerations noted. HEENT: Head: normal shape and size; Eyes: sclera white, no icterus, conjunctiva pink, PERRLA and EOMs intact; Ears: Tm's gray and intact, normal light reflex;  Cardiovascular: Normal rate and rhythm. S1,S2 noted.  No murmur, rubs or gallops noted. No JVD or BLE edema. Pulmonary/Chest: Normal effort and positive vesicular breath sounds. No respiratory distress. No wheezes, rales or ronchi noted.  Abdomen: Soft and nontender. Normal bowel sounds. No distention or masses noted. Liver, spleen and kidneys non palpable. Musculoskeletal: Strength 5/5 BUE/BLE. No difficulty with gait.  Neurological: Alert and oriented. Cranial nerves II-XII grossly intact. Coordination normal.  Psychiatric: Mood and affect normal. Behavior is normal. Judgment and thought content normal.     BMET    Component Value Date/Time   NA 137 10/04/2017 0928   K 3.5 10/04/2017 0928   CL 102 10/04/2017 0928   CO2 28 10/04/2017 0928   GLUCOSE 128 (H) 10/04/2017 0928   BUN 9 10/04/2017 0928   CREATININE 0.63 10/04/2017 0928   CREATININE 0.99 01/05/2014 1353   CALCIUM 8.7 10/04/2017 0928    Lipid Panel     Component Value Date/Time   CHOL 119 10/04/2017 0928   TRIG 138.0 10/04/2017 0928   HDL 34.50 (L) 10/23/2015 1017   CHOLHDL 4 10/23/2015 1017   VLDL 27.6 10/04/2017 0928   LDLCALC 70 10/23/2015 1017    CBC    Component Value Date/Time   WBC 4.3 10/04/2017 0928   RBC 4.66 10/04/2017 0928   HGB 12.2 10/04/2017 0928   HCT 37.3  10/04/2017 0928   PLT 242.0 10/04/2017 0928   MCV 79.9 10/04/2017 0928   MCH 27.9 01/05/2014 1353   MCHC 32.8 10/04/2017 0928   RDW 19.3 (H) 10/04/2017 0928    Hgb A1C Lab Results  Component Value Date   HGBA1C 6.2 10/04/2017            Assessment & Plan:   Preventative Health Maintenance:  Encouraged her to get a flu shot in the fall Tetanus UTD Pap smear UTD Encouraged her to schedule her mammogram for 02/2019. Encouraged her to visit eye doctor annually and dentist biannually. Encouraged a low fat/low carb diet and increase exercise. Will check CBC, CMP, Lipids, Vitamin D, A1C  Return in 3 weeks for BP recheck.  Webb Silversmith,  NP

## 2018-12-22 LAB — LIPID PANEL
Cholesterol: 145 mg/dL (ref 0–200)
HDL: 34.3 mg/dL — ABNORMAL LOW (ref >39.00)
LDL Cholesterol: 74 mg/dL (ref 0–99)
NonHDL: 111.07
Total CHOL/HDL Ratio: 4
Triglycerides: 187 mg/dL — ABNORMAL HIGH (ref 0.0–149.0)
VLDL: 37.4 mg/dL (ref 0.0–40.0)

## 2018-12-22 LAB — COMPREHENSIVE METABOLIC PANEL
ALT: 17 U/L (ref 0–35)
AST: 15 U/L (ref 0–37)
Albumin: 3.5 g/dL (ref 3.5–5.2)
Alkaline Phosphatase: 76 U/L (ref 39–117)
BUN: 9 mg/dL (ref 6–23)
CO2: 30 mEq/L (ref 19–32)
Calcium: 8.6 mg/dL (ref 8.4–10.5)
Chloride: 103 mEq/L (ref 96–112)
Creatinine, Ser: 0.67 mg/dL (ref 0.40–1.20)
GFR: 117.18 mL/min (ref >60.00)
Glucose, Bld: 111 mg/dL — ABNORMAL HIGH (ref 70–99)
Potassium: 3.4 mEq/L — ABNORMAL LOW (ref 3.5–5.1)
Sodium: 139 mEq/L (ref 135–145)
Total Bilirubin: 0.4 mg/dL (ref 0.2–1.2)
Total Protein: 7 g/dL (ref 6.0–8.3)

## 2018-12-22 LAB — CBC
HCT: 35.6 % — ABNORMAL LOW (ref 36.0–46.0)
Hemoglobin: 11.5 g/dL — ABNORMAL LOW (ref 12.0–15.0)
MCHC: 32.3 g/dL (ref 30.0–36.0)
MCV: 80.6 fl (ref 78.0–100.0)
Platelets: 262 10*3/uL (ref 150.0–400.0)
RBC: 4.41 Mil/uL (ref 3.87–5.11)
RDW: 18.4 % — ABNORMAL HIGH (ref 11.5–15.5)
WBC: 4.6 10*3/uL (ref 4.0–10.5)

## 2018-12-22 LAB — VITAMIN D 25 HYDROXY (VIT D DEFICIENCY, FRACTURES): VITD: 7.13 ng/mL — ABNORMAL LOW (ref 30.00–100.00)

## 2018-12-22 LAB — HEMOGLOBIN A1C: Hgb A1c MFr Bld: 6.5 % (ref 4.6–6.5)

## 2019-01-11 ENCOUNTER — Other Ambulatory Visit: Payer: Self-pay

## 2019-01-11 ENCOUNTER — Encounter: Payer: Self-pay | Admitting: Internal Medicine

## 2019-01-11 ENCOUNTER — Ambulatory Visit (INDEPENDENT_AMBULATORY_CARE_PROVIDER_SITE_OTHER): Payer: 59 | Admitting: Internal Medicine

## 2019-01-11 DIAGNOSIS — I10 Essential (primary) hypertension: Secondary | ICD-10-CM | POA: Diagnosis not present

## 2019-01-11 DIAGNOSIS — T502X5A Adverse effect of carbonic-anhydrase inhibitors, benzothiadiazides and other diuretics, initial encounter: Secondary | ICD-10-CM

## 2019-01-11 DIAGNOSIS — E876 Hypokalemia: Secondary | ICD-10-CM | POA: Diagnosis not present

## 2019-01-11 DIAGNOSIS — E559 Vitamin D deficiency, unspecified: Secondary | ICD-10-CM | POA: Insufficient documentation

## 2019-01-11 DIAGNOSIS — R7303 Prediabetes: Secondary | ICD-10-CM

## 2019-01-11 MED ORDER — AMLODIPINE BESYLATE 5 MG PO TABS: 5.0000 mg | ORAL_TABLET | Freq: Every day | ORAL | 0 refills | Status: DC

## 2019-01-11 MED ORDER — VITAMIN D (ERGOCALCIFEROL) 1.25 MG (50000 UNIT) PO CAPS: 50000.0000 [IU] | ORAL_CAPSULE | ORAL | 0 refills | Status: DC

## 2019-01-11 NOTE — Assessment & Plan Note (Signed)
Still not at goal. 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

## 2019-01-11 NOTE — Progress Notes (Signed)
Subjective:    Patient ID: Kathy [name], female    DOB: 1975-08-16, 43 y.o.   MRN: [id number]  HPI  Pt due for follow up for HTN. She is taking her Losartan HCT as prescribed. She takes a Potassium supplement as well. She denies adverse side effects. Her BP today is 132/92. ECG from 01/2015 reviewed. CMET from 12/2018 reviewed.  She also needs to follow up on DM 2. Recent A1C 6.5%, 12/2018. New diagnosis. She does not monitor her sugars. She does not consume a low carb diet. She does not check her feet regularly. She does not get a yearly eye exam. Her flu shot is UTD. She has never had a pneumonia vaccine.  Her Vit D was also low, 7, 12/2018. She is not currently taking any Vit D OTC.   Review of Systems      Past Medical History:  Diagnosis Date  . Hypertension     Current Outpatient Medications  Medication Sig Dispense Refill  . fluticasone (FLONASE) 50 MCG/ACT nasal spray Place 2 sprays into both nostrils daily. 16 g 1  . losartan-hydrochlorothiazide (HYZAAR) 50-12.5 MG tablet Take 2 tablets by mouth daily. 60 tablet 0   No current facility-administered medications for this visit.     No Known Allergies  Family History  Problem Relation Age of Onset  . Hypertension Mother   . Diabetes Maternal Aunt     Social History   Socioeconomic History  . Marital status: Single    Spouse name: Not on file  . Number of children: Not on file  . Years of education: Not on file  . Highest education level: Not on file  Occupational History  . Not on file  Social Needs  . Financial resource strain: Not on file  . Food insecurity    Worry: Not on file    Inability: Not on file  . Transportation needs    Medical: Not on file    Non-medical: Not on file  Tobacco Use  . Smoking status: Never Smoker  . Smokeless tobacco: Never Used  Substance and Sexual Activity  . Alcohol use: Yes    Alcohol/week: 0.0 standard drinks    Comment: rare  . Drug use: No  . Sexual  activity: Yes    Birth control/protection: Surgical  Lifestyle  . Physical activity    Days per week: Not on file    Minutes per session: Not on file  . Stress: Not on file  Relationships  . Social Musicianconnections    Talks on phone: Not on file    Gets together: Not on file    Attends religious service: Not on file    Active member of club or organization: Not on file    Attends meetings of clubs or organizations: Not on file    Relationship status: Not on file  . Intimate partner violence    Fear of current or ex partner: Not on file    Emotionally abused: Not on file    Physically abused: Not on file    Forced sexual activity: Not on file  Other Topics Concern  . Not on file  Social History Narrative  . Not on file     Constitutional: Denies fever, malaise, fatigue, headache or abrupt weight changes.  Respiratory: Denies difficulty breathing, shortness of breath, cough or sputum production.   Cardiovascular: Denies chest pain, chest tightness, palpitations or swelling in the hands or feet.  Neurological: Denies dizziness, difficulty with memory, difficulty  with speech or problems with balance and coordination.   No other specific complaints in a complete review of systems (except as listed in HPI above).  Objective:   Physical Exam  BP (!) 132/92   Pulse 76   Temp 98.7 F (37.1 C) (Temporal)   Wt 269 lb (122 kg)   SpO2 98%   BMI 46.90 kg/m    Wt Readings from Last 3 Encounters:  12/21/18 271 lb (122.9 kg)  05/04/18 277 lb (125.6 kg)  10/04/17 271 lb (122.9 kg)    General: Appears her stated age, obese, in NAD. Skin: No lower extremity ulcerations noted. Cardiovascular: Normal rate and rhythm. S1,S2 noted.  No murmur, rubs or gallops noted. No JVD or BLE edema. Pulmonary/Chest: Normal effort and positive vesicular breath sounds. No respiratory distress. No wheezes, rales or ronchi noted.  Neurological: Alert and oriented.    BMET    Component Value Date/Time    NA 139 12/21/2018 1457   K 3.4 (L) 12/21/2018 1457   CL 103 12/21/2018 1457   CO2 30 12/21/2018 1457   GLUCOSE 111 (H) 12/21/2018 1457   BUN 9 12/21/2018 1457   CREATININE 0.67 12/21/2018 1457   CREATININE 0.99 01/05/2014 1353   CALCIUM 8.6 12/21/2018 1457    Lipid Panel     Component Value Date/Time   CHOL 145 12/21/2018 1457   TRIG 187.0 (H) 12/21/2018 1457   HDL 34.30 (L) 12/21/2018 1457   CHOLHDL 4 12/21/2018 1457   VLDL 37.4 12/21/2018 1457   LDLCALC 74 12/21/2018 1457    CBC    Component Value Date/Time   WBC 4.6 12/21/2018 1457   RBC 4.41 12/21/2018 1457   HGB 11.5 (L) 12/21/2018 1457   HCT 35.6 (L) 12/21/2018 1457   PLT 262.0 12/21/2018 1457   MCV 80.6 12/21/2018 1457   MCH 27.9 01/05/2014 1353   MCHC 32.3 12/21/2018 1457   RDW 18.4 (H) 12/21/2018 1457    Hgb A1C Lab Results  Component Value Date   HGBA1C 6.5 12/21/2018           Assessment & Plan:

## 2019-01-11 NOTE — Assessment & Plan Note (Signed)
Ergocalciferol 50000 units weekly x 12 weeks Repeat Vit D in 3 months, lab only

## 2019-01-11 NOTE — Assessment & Plan Note (Signed)
New diagnosis Discussed diabetes and standards of medical care She declines additional medication at this time She would like to take 3 months to work on diet and weight loss Discussed low carb diet and exercise for weight loss Encouraged routine foot exams Encouraged routine eye exams Encouraged flu and pneumovax in the fall  Lab only appt in 3 months for BMET, A1C and Vit D

## 2019-01-11 NOTE — Patient Instructions (Signed)

## 2019-01-11 NOTE — Assessment & Plan Note (Signed)
Will check BMET in 3 months lab only Continue Potassium supplement for now

## 2019-02-20 ENCOUNTER — Other Ambulatory Visit: Payer: Self-pay | Admitting: Internal Medicine

## 2019-02-24 ENCOUNTER — Other Ambulatory Visit: Payer: Self-pay | Admitting: Internal Medicine

## 2019-02-24 DIAGNOSIS — Z1231 Encounter for screening mammogram for malignant neoplasm of breast: Secondary | ICD-10-CM

## 2019-03-08 ENCOUNTER — Telehealth: Payer: Self-pay | Admitting: Internal Medicine

## 2019-03-08 NOTE — Telephone Encounter (Signed)
error 

## 2019-03-23 ENCOUNTER — Other Ambulatory Visit: Payer: Self-pay | Admitting: Internal Medicine

## 2019-03-31 ENCOUNTER — Ambulatory Visit
Admission: RE | Admit: 2019-03-31 | Discharge: 2019-03-31 | Disposition: A | Discharge status: Home / Self Care | Payer: 59 | Source: Ambulatory Visit | Attending: Internal Medicine | Admitting: Internal Medicine

## 2019-03-31 DIAGNOSIS — Z1231 Encounter for screening mammogram for malignant neoplasm of breast: Secondary | ICD-10-CM | POA: Diagnosis not present

## 2019-04-19 ENCOUNTER — Other Ambulatory Visit: Payer: 59

## 2019-04-19 ENCOUNTER — Other Ambulatory Visit (INDEPENDENT_AMBULATORY_CARE_PROVIDER_SITE_OTHER): Payer: 59

## 2019-04-19 DIAGNOSIS — R7303 Prediabetes: Secondary | ICD-10-CM

## 2019-04-19 DIAGNOSIS — E559 Vitamin D deficiency, unspecified: Secondary | ICD-10-CM

## 2019-04-19 DIAGNOSIS — E876 Hypokalemia: Secondary | ICD-10-CM | POA: Diagnosis not present

## 2019-04-19 DIAGNOSIS — I10 Essential (primary) hypertension: Secondary | ICD-10-CM | POA: Diagnosis not present

## 2019-04-19 DIAGNOSIS — T502X5A Adverse effect of carbonic-anhydrase inhibitors, benzothiadiazides and other diuretics, initial encounter: Secondary | ICD-10-CM | POA: Diagnosis not present

## 2019-04-19 LAB — VITAMIN D 25 HYDROXY (VIT D DEFICIENCY, FRACTURES): VITD: 25.32 ng/mL — ABNORMAL LOW (ref 30.00–100.00)

## 2019-04-19 LAB — BASIC METABOLIC PANEL
BUN: 10 mg/dL (ref 6–23)
CO2: 28 mEq/L (ref 19–32)
Calcium: 8.8 mg/dL (ref 8.4–10.5)
Chloride: 100 mEq/L (ref 96–112)
Creatinine, Ser: 0.79 mg/dL (ref 0.40–1.20)
GFR: 96.74 mL/min (ref >60.00)
Glucose, Bld: 122 mg/dL — ABNORMAL HIGH (ref 70–99)
Potassium: 3.4 mEq/L — ABNORMAL LOW (ref 3.5–5.1)
Sodium: 137 mEq/L (ref 135–145)

## 2019-04-19 LAB — HEMOGLOBIN A1C: Hgb A1c MFr Bld: 6.6 % — ABNORMAL HIGH (ref 4.6–6.5)

## 2019-04-24 ENCOUNTER — Other Ambulatory Visit: Payer: Self-pay

## 2019-04-24 ENCOUNTER — Ambulatory Visit (INDEPENDENT_AMBULATORY_CARE_PROVIDER_SITE_OTHER): Payer: 59 | Admitting: Internal Medicine

## 2019-04-24 ENCOUNTER — Encounter: Payer: Self-pay | Admitting: Internal Medicine

## 2019-04-24 VITALS — BP 140/94 | HR 66 | Temp 97.7°F | Wt 270.0 lb

## 2019-04-24 DIAGNOSIS — T502X5A Adverse effect of carbonic-anhydrase inhibitors, benzothiadiazides and other diuretics, initial encounter: Secondary | ICD-10-CM | POA: Diagnosis not present

## 2019-04-24 DIAGNOSIS — I10 Essential (primary) hypertension: Secondary | ICD-10-CM

## 2019-04-24 DIAGNOSIS — E876 Hypokalemia: Secondary | ICD-10-CM | POA: Diagnosis not present

## 2019-04-24 DIAGNOSIS — R682 Dry mouth, unspecified: Secondary | ICD-10-CM

## 2019-04-24 MED ORDER — POTASSIUM CHLORIDE 20 MEQ/15ML (10%) PO SOLN: 10.0000 meq | Freq: Every day | ORAL | 2 refills | Status: DC

## 2019-04-24 NOTE — Progress Notes (Signed)
Subjective:    Patient ID: Kathy [name], female    DOB: December 12, 1975, 43 y.o.   MRN: [id number]  HPI  Pt presents to the clinic today with c/o dry mouth. She noticed this 1 week ago. She reports the dry mouth is causing difficulty swallowing pills, especially large ones like the Potassium, and even food. She also feels like the Potassium is causing an increase in gas. She denies excessively dry skin, dry eyes, constipation. Her bowels are moving normally. She is drinking at least 64 ounces of water. She has tried lozenges, ACT and Bioten OTC with minimal relief. She has no family history of Sjogrens.  Review of Systems      Past Medical History:  Diagnosis Date  . Hypertension     Current Outpatient Medications  Medication Sig Dispense Refill  . amLODipine (NORVASC) 5 MG tablet TAKE 1 TABLET BY MOUTH DAILY. 30 tablet 0  . fluticasone (FLONASE) 50 MCG/ACT nasal spray Place 2 sprays into both nostrils daily. 16 g 1  . losartan-hydrochlorothiazide (HYZAAR) 50-12.5 MG tablet TAKE 2 TABLETS BY MOUTH DAILY. 60 tablet 0  . Vitamin D, Ergocalciferol, (DRISDOL) 1.25 MG (50000 UT) CAPS capsule Take 1 capsule (50,000 Units total) by mouth every 7 (seven) days. 12 capsule 0   No current facility-administered medications for this visit.     No Known Allergies  Family History  Problem Relation Age of Onset  . Hypertension Mother   . Diabetes Maternal Aunt   . Breast cancer Neg Hx     Social History   Socioeconomic History  . Marital status: Single    Spouse name: Not on file  . Number of children: Not on file  . Years of education: Not on file  . Highest education level: Not on file  Occupational History  . Not on file  Social Needs  . Financial resource strain: Not on file  . Food insecurity    Worry: Not on file    Inability: Not on file  . Transportation needs    Medical: Not on file    Non-medical: Not on file  Tobacco Use  . Smoking status: Never Smoker  .  Smokeless tobacco: Never Used  Substance and Sexual Activity  . Alcohol use: Yes    Alcohol/week: 0.0 standard drinks    Comment: rare  . Drug use: No  . Sexual activity: Yes    Birth control/protection: Surgical  Lifestyle  . Physical activity    Days per week: Not on file    Minutes per session: Not on file  . Stress: Not on file  Relationships  . Social Musician on phone: Not on file    Gets together: Not on file    Attends religious service: Not on file    Active member of club or organization: Not on file    Attends meetings of clubs or organizations: Not on file    Relationship status: Not on file  . Intimate partner violence    Fear of current or ex partner: Not on file    Emotionally abused: Not on file    Physically abused: Not on file    Forced sexual activity: Not on file  Other Topics Concern  . Not on file  Social History Narrative  . Not on file     Constitutional: Denies fever, malaise, fatigue, headache or abrupt weight changes.  HEENT: Pt reports dry mouth. Denies eye pain, eye redness, ear pain, ringing in  the ears, wax buildup, runny nose, nasal congestion, bloody nose, or sore throat. Respiratory: Denies difficulty breathing, shortness of breath, cough or sputum production.   Cardiovascular: Denies chest pain, chest tightness, palpitations or swelling in the hands or feet.  Gastrointestinal: Pt reports gassiness. Denies abdominal pain, bloating, constipation, diarrhea or blood in the stool.  Skin: Denies redness, rashes, lesions or ulcercations.    No other specific complaints in a complete review of systems (except as listed in HPI above).  Objective:   Physical Exam  BP (!) 140/94   Pulse 66   Temp 97.7 F (36.5 C) (Temporal)   Wt 270 lb (122.5 kg)   SpO2 98%   BMI 47.08 kg/m  Wt Readings from Last 3 Encounters:  04/24/19 270 lb (122.5 kg)  01/11/19 269 lb (122 kg)  12/21/18 271 lb (122.9 kg)    [name]: Appears her stated  age, obese, in NAD. Skin: Warm, dry and intact. No rashes, lesions or ulcerations noted. HEENT: Head: normal shape and size; Eyes: sclera white, no icterus, conjunctiva pink, PERRLA and EOMs intact;Throat/Mouth: Teeth present, mucosa pink and moist, no exudate, lesions or ulcerations noted.  Cardiovascular: Normal rate and rhythm. S1,S2 noted.  No murmur, rubs or gallops noted.  Pulmonary/Chest: Normal effort and positive vesicular breath sounds. No respiratory distress. No wheezes, rales or ronchi noted.  Neurological: Alert and oriented.    BMET    Component Value Date/Time   NA 137 04/19/2019 0741   K 3.4 (L) 04/19/2019 0741   CL 100 04/19/2019 0741   CO2 28 04/19/2019 0741   GLUCOSE 122 (H) 04/19/2019 0741   BUN 10 04/19/2019 0741   CREATININE 0.79 04/19/2019 0741   CREATININE 0.99 01/05/2014 1353   CALCIUM 8.8 04/19/2019 0741    Lipid Panel     Component Value Date/Time   CHOL 145 12/21/2018 1457   TRIG 187.0 (H) 12/21/2018 1457   HDL 34.30 (L) 12/21/2018 1457   CHOLHDL 4 12/21/2018 1457   VLDL 37.4 12/21/2018 1457   LDLCALC 74 12/21/2018 1457    CBC    Component Value Date/Time   WBC 4.6 12/21/2018 1457   RBC 4.41 12/21/2018 1457   HGB 11.5 (L) 12/21/2018 1457   HCT 35.6 (L) 12/21/2018 1457   PLT 262.0 12/21/2018 1457   MCV 80.6 12/21/2018 1457   MCH 27.9 01/05/2014 1353   MCHC 32.3 12/21/2018 1457   RDW 18.4 (H) 12/21/2018 1457    Hgb A1C Lab Results  Component Value Date   HGBA1C 6.6 (H) 04/19/2019         Assessment & Plan:   Dry Mouth:  No signs of dehydration, thrust etc She denies dry mouth, skin etc- so not likely Sjogren's and she declines this blood workup today Continue Biotin and ACT Encouraged adequate water intake Encouraged use of hard candy's to help keep mouth moist  RTC in 3 months for follow up of chronic conditions Webb Silversmith, NP

## 2019-04-24 NOTE — Assessment & Plan Note (Signed)
Will send in Potassium Chloride 10 meq daily

## 2019-04-24 NOTE — Assessment & Plan Note (Signed)
Non compliant- has not taken meds in over 1 week Advised her if she is having trouble swallowing, she can crush them and put in applesauce Encouraged DASH diet and exercise for weight loss Will monitor

## 2019-04-24 NOTE — Patient Instructions (Signed)

## 2019-05-12 ENCOUNTER — Telehealth: Payer: Self-pay | Admitting: *Deleted

## 2019-05-12 ENCOUNTER — Encounter: Payer: Self-pay | Admitting: Internal Medicine

## 2019-05-12 ENCOUNTER — Ambulatory Visit (INDEPENDENT_AMBULATORY_CARE_PROVIDER_SITE_OTHER): Payer: 59 | Admitting: Internal Medicine

## 2019-05-12 DIAGNOSIS — R519 Headache, unspecified: Secondary | ICD-10-CM

## 2019-05-12 DIAGNOSIS — R0981 Nasal congestion: Secondary | ICD-10-CM

## 2019-05-12 DIAGNOSIS — R059 Cough, unspecified: Secondary | ICD-10-CM

## 2019-05-12 DIAGNOSIS — J029 Acute pharyngitis, unspecified: Secondary | ICD-10-CM

## 2019-05-12 DIAGNOSIS — R432 Parageusia: Secondary | ICD-10-CM

## 2019-05-12 DIAGNOSIS — R05 Cough: Secondary | ICD-10-CM | POA: Diagnosis not present

## 2019-05-12 DIAGNOSIS — R0602 Shortness of breath: Secondary | ICD-10-CM | POA: Diagnosis not present

## 2019-05-12 DIAGNOSIS — H9203 Otalgia, bilateral: Secondary | ICD-10-CM

## 2019-05-12 DIAGNOSIS — R430 Anosmia: Secondary | ICD-10-CM | POA: Diagnosis not present

## 2019-05-12 MED ORDER — PROMETHAZINE-DM 6.25-15 MG/5ML PO SYRP: 5.0000 mL | ORAL_SOLUTION | Freq: Four times a day (QID) | ORAL | 0 refills | Status: DC | PRN

## 2019-05-12 NOTE — Telephone Encounter (Signed)
Patient called stating that she has a sore throat, cough, face and eyes hurts. Patient stated that she works at [employer] and not sure what to do? Advised patient that she needs to call [facility] and she verbalized understanding. Patient scheduled for a virtual visit today with Webb Silversmith NP.

## 2019-05-12 NOTE — Telephone Encounter (Signed)
Will address at upcoming appt

## 2019-05-12 NOTE — Patient Instructions (Signed)
COVID-19: How to Protect Yourself and Others Know how it spreads  There is currently no vaccine to prevent coronavirus disease 2019 (COVID-19).  The best way to prevent illness is to avoid being exposed to this virus.  The virus is thought to spread mainly from person-to-person. ? Between people who are in close contact with one another (within about 6 feet). ? Through respiratory droplets produced when an infected person coughs, sneezes or talks. ? These droplets can land in the mouths or noses of people who are nearby or possibly be inhaled into the lungs. ? Some recent studies have suggested that COVID-19 may be spread by people who are not showing symptoms. Everyone should Clean your hands often  Wash your hands often with soap and water for at least 20 seconds especially after you have been in a public place, or after blowing your nose, coughing, or sneezing.  If soap and water are not readily available, use a hand sanitizer that contains at least 60% alcohol. Cover all surfaces of your hands and rub them together until they feel dry.  Avoid touching your eyes, nose, and mouth with unwashed hands. Avoid close contact  Stay home if you are sick.  Avoid close contact with people who are sick.  Put distance between yourself and other people. ? Remember that some people without symptoms may be able to spread virus. ? This is especially important for people who are at higher risk of getting very sick.www.cdc.gov/coronavirus/2019-ncov/need-extra-precautions/people-at-higher-risk.html Cover your mouth and nose with a cloth face cover when around others  You could spread COVID-19 to others even if you do not feel sick.  Everyone should wear a cloth face cover when they have to go out in public, for example to the grocery store or to pick up other necessities. ? Cloth face coverings should not be placed on young children under age 2, anyone who has trouble breathing, or is unconscious,  incapacitated or otherwise unable to remove the mask without assistance.  The cloth face cover is meant to protect other people in case you are infected.  Do NOT use a facemask meant for a healthcare worker.  Continue to keep about 6 feet between yourself and others. The cloth face cover is not a substitute for social distancing. Cover coughs and sneezes  If you are in a private setting and do not have on your cloth face covering, remember to always cover your mouth and nose with a tissue when you cough or sneeze or use the inside of your elbow.  Throw used tissues in the trash.  Immediately wash your hands with soap and water for at least 20 seconds. If soap and water are not readily available, clean your hands with a hand sanitizer that contains at least 60% alcohol. Clean and disinfect  Clean AND disinfect frequently touched surfaces daily. This includes tables, doorknobs, light switches, countertops, handles, desks, phones, keyboards, toilets, faucets, and sinks. www.cdc.gov/coronavirus/2019-ncov/prevent-getting-sick/disinfecting-your-home.html  If surfaces are dirty, clean them: Use detergent or soap and water prior to disinfection.  Then, use a household disinfectant. You can see a list of EPA-registered household disinfectants here. cdc.gov/coronavirus 10/11/2018 This information is not intended to replace advice given to you by your health care provider. Make sure you discuss any questions you have with your health care provider. Document Released: 09/20/2018 Document Revised: 10/19/2018 Document Reviewed: 09/20/2018 Elsevier Patient Education  2020 [employer] Inc[employer]  

## 2019-05-12 NOTE — Progress Notes (Signed)
Virtual Visit via Video Note  I connected with Kathy [name] on 05/12/19 at  4:15 PM EST by a video enabled telemedicine application and verified that I am speaking with the correct person using two identifiers.  Location: Patient: Home Provider: Office   I discussed the limitations of evaluation and management by telemedicine and the availability of in person appointments. The patient expressed understanding and agreed to proceed.  History of Present Illness:  Pt reports facial pain and pressure, nasal congestion, ear pain, sore throat, loss of taste/[place] cough. This started 2-3 days ago. She reports the facial pain worsened yesterday. She is not able to blow anything out of her nose. She describes the ear pain as achy without loss of hearing or drainage. She is having some difficulty swallowing but has not noticed any white spots on her throat. The cough is productive of clear mucous. She has had some intermittent SOB. She denies fever, chills or body aches. She has not tried anything OTC. She has not had any positive COVID exposure that she is aware of but she does work in a hospital.   Past Medical History:  Diagnosis Date  . Hypertension     Current Outpatient Medications  Medication Sig Dispense Refill  . amLODipine (NORVASC) 5 MG tablet TAKE 1 TABLET BY MOUTH DAILY. 30 tablet 0  . fluticasone (FLONASE) 50 MCG/ACT nasal spray Place 2 sprays into both nostrils daily. 16 g 1  . losartan-hydrochlorothiazide (HYZAAR) 50-12.5 MG tablet TAKE 2 TABLETS BY MOUTH DAILY. 60 tablet 0  . potassium chloride 20 MEQ/15ML (10%) SOLN Take 7.5 mLs (10 mEq total) by mouth daily. 225 mL 2  . Vitamin D, Ergocalciferol, (DRISDOL) 1.25 MG (50000 UT) CAPS capsule Take 1 capsule (50,000 Units total) by mouth every 7 (seven) days. 12 capsule 0   No current facility-administered medications for this visit.     No Known Allergies  Family History  Problem Relation Age of Onset  . Hypertension  Mother   . Diabetes Maternal Aunt   . Breast cancer Neg Hx     Social History   Socioeconomic History  . Marital status: Single    Spouse name: Not on file  . Number of children: Not on file  . Years of education: Not on file  . Highest education level: Not on file  Occupational History  . Not on file  Social Needs  . Financial resource strain: Not on file  . Food insecurity    Worry: Not on file    Inability: Not on file  . Transportation needs    Medical: Not on file    Non-medical: Not on file  Tobacco Use  . Smoking status: Never Smoker  . Smokeless tobacco: Never Used  Substance and Sexual Activity  . Alcohol use: Yes    Alcohol/week: 0.0 standard drinks    Comment: rare  . Drug use: No  . Sexual activity: Yes    Birth control/protection: Surgical  Lifestyle  . Physical activity    Days per week: Not on file    Minutes per session: Not on file  . Stress: Not on file  Relationships  . Social Musician on phone: Not on file    Gets together: Not on file    Attends religious service: Not on file    Active member of club or organization: Not on file    Attends meetings of clubs or organizations: Not on file    Relationship status:  Not on file  . Intimate partner violence    Fear of current or ex partner: Not on file    Emotionally abused: Not on file    Physically abused: Not on file    Forced sexual activity: Not on file  Other Topics Concern  . Not on file  Social History Narrative  . Not on file     Constitutional: Denies fever, malaise, fatigue, headache or abrupt weight changes.  HEENT: Pt reports facial pain and pressure, ear pain, loss of taste/smell, nasal congestion and sore throat. Denies eye pain, eye redness, ringing in the ears, wax buildup, runny nose, bloody nose. Respiratory: Pt reports cough, shortness of breath. Denies difficulty breathing, shortness of breath, or sputum production.   Cardiovascular: Denies chest pain, chest  tightness, palpitations or swelling in the hands or feet.    No other specific complaints in a complete review of systems (except as listed in HPI above).    Observations/Objective:   Wt Readings from Last 3 Encounters:  04/24/19 270 lb (122.5 kg)  01/11/19 269 lb (122 kg)  12/21/18 271 lb (122.9 kg)    [name]: Appears her stated age, obese, in NAD. HEENT: Head: normal shape and size; Nose: does not sound congested; Throat/Mouth: no hoarseness noted Pulmonary/Chest: Normal effort. No respiratory distress.   Neurological: Alert and oriented.   BMET    Component Value Date/Time   NA 137 04/19/2019 0741   K 3.4 (L) 04/19/2019 0741   CL 100 04/19/2019 0741   CO2 28 04/19/2019 0741   GLUCOSE 122 (H) 04/19/2019 0741   BUN 10 04/19/2019 0741   CREATININE 0.79 04/19/2019 0741   CREATININE 0.99 01/05/2014 1353   CALCIUM 8.8 04/19/2019 0741    Lipid Panel     Component Value Date/Time   CHOL 145 12/21/2018 1457   TRIG 187.0 (H) 12/21/2018 1457   HDL 34.30 (L) 12/21/2018 1457   CHOLHDL 4 12/21/2018 1457   VLDL 37.4 12/21/2018 1457   LDLCALC 74 12/21/2018 1457    CBC    Component Value Date/Time   WBC 4.6 12/21/2018 1457   RBC 4.41 12/21/2018 1457   HGB 11.5 (L) 12/21/2018 1457   HCT 35.6 (L) 12/21/2018 1457   PLT 262.0 12/21/2018 1457   MCV 80.6 12/21/2018 1457   MCH 27.9 01/05/2014 1353   MCHC 32.3 12/21/2018 1457   RDW 18.4 (H) 12/21/2018 1457    Hgb A1C Lab Results  Component Value Date   HGBA1C 6.6 (H) 04/19/2019       Assessment and Plan:  Facial Pain and Pressure, Nasal Congestion, Ear Pain, Loss of Taste/Smell, Sore Throat, Cough, SOB:  She plans to get COVID tested at this time Discussed symptomatic treatment: rest, fluids Can take Tylenol or Ibuprofen for sore throat and facial pressure Start Zyrtec and Flonase OTC Start Zinc and Vitamin C OTC RX for Promethazine DM for cough Discussed the importance of self quarantine until symptoms  resolve Encouraged masking, social distancing and frequent handwashing  Return/ER precautions discussed  Follow Up Instructions:    I discussed the assessment and treatment plan with the patient. The patient was provided an opportunity to ask questions and all were answered. The patient agreed with the plan and demonstrated an understanding of the instructions.   The patient was advised to call back or seek an in-person evaluation if the symptoms worsen or if the condition fails to improve as anticipated.    Webb Silversmith, NP

## 2019-07-06 ENCOUNTER — Other Ambulatory Visit: Payer: Self-pay | Admitting: Internal Medicine

## 2019-07-25 ENCOUNTER — Encounter: Payer: Self-pay | Admitting: Internal Medicine

## 2019-07-25 ENCOUNTER — Other Ambulatory Visit: Payer: Self-pay

## 2019-07-25 ENCOUNTER — Ambulatory Visit (INDEPENDENT_AMBULATORY_CARE_PROVIDER_SITE_OTHER): Payer: 59 | Admitting: Internal Medicine

## 2019-07-25 VITALS — BP 138/86 | HR 72 | Temp 98.1°F | Wt 269.0 lb

## 2019-07-25 DIAGNOSIS — Z6841 Body Mass Index (BMI) 40.0 and over, adult: Secondary | ICD-10-CM

## 2019-07-25 DIAGNOSIS — E876 Hypokalemia: Secondary | ICD-10-CM | POA: Diagnosis not present

## 2019-07-25 DIAGNOSIS — R7303 Prediabetes: Secondary | ICD-10-CM | POA: Diagnosis not present

## 2019-07-25 DIAGNOSIS — I10 Essential (primary) hypertension: Secondary | ICD-10-CM | POA: Diagnosis not present

## 2019-07-25 DIAGNOSIS — E559 Vitamin D deficiency, unspecified: Secondary | ICD-10-CM

## 2019-07-25 DIAGNOSIS — T502X5A Adverse effect of carbonic-anhydrase inhibitors, benzothiadiazides and other diuretics, initial encounter: Secondary | ICD-10-CM | POA: Diagnosis not present

## 2019-07-25 LAB — LIPID PANEL
Cholesterol: 142 mg/dL (ref 0–200)
HDL: 34.7 mg/dL — ABNORMAL LOW (ref >39.00)
NonHDL: 107.7
Total CHOL/HDL Ratio: 4
Triglycerides: 248 mg/dL — ABNORMAL HIGH (ref 0.0–149.0)
VLDL: 49.6 mg/dL — ABNORMAL HIGH (ref 0.0–40.0)

## 2019-07-25 LAB — BASIC METABOLIC PANEL
BUN: 9 mg/dL (ref 6–23)
CO2: 29 mEq/L (ref 19–32)
Calcium: 8.8 mg/dL (ref 8.4–10.5)
Chloride: 100 mEq/L (ref 96–112)
Creatinine, Ser: 0.69 mg/dL (ref 0.40–1.20)
GFR: 112.95 mL/min (ref >60.00)
Glucose, Bld: 151 mg/dL — ABNORMAL HIGH (ref 70–99)
Potassium: 3.3 mEq/L — ABNORMAL LOW (ref 3.5–5.1)
Sodium: 134 mEq/L — ABNORMAL LOW (ref 135–145)

## 2019-07-25 LAB — LDL CHOLESTEROL, DIRECT: Direct LDL: 91 mg/dL

## 2019-07-25 LAB — HEMOGLOBIN A1C: Hgb A1c MFr Bld: 7.6 % — ABNORMAL HIGH (ref 4.6–6.5)

## 2019-07-25 LAB — VITAMIN D 25 HYDROXY (VIT D DEFICIENCY, FRACTURES): VITD: 12.36 ng/mL — ABNORMAL LOW (ref 30.00–100.00)

## 2019-07-25 NOTE — Assessment & Plan Note (Signed)
Discussed role of bariatric surgery- she will check out the [facility] website She could benefit from losing 100 lbs Encouraged keto diet, with no more than 1200 calories per day

## 2019-07-25 NOTE — Assessment & Plan Note (Signed)
A1C today No urine microalbumin secondary to ARB therapy Encouraged her to consume a low carb diet, exercise for weight loss No medications at this time Encouraged routine foot exams Encouraged routine eye exam- advised her [facility] does not do dilated eye exams Flu shot UTD Will get pneumovax at next visit (just had Covid vaccine so unable to vaccinate x 4 weeks)

## 2019-07-25 NOTE — Progress Notes (Signed)
Subjective:    Patient ID: Kathy [name], female    DOB: 26-Jul-1975, 44 y.o.   MRN: [id number]  HPI  Pt presents to the clinic today for 3 month follow up of chronic conditions.  HTN: Her BP today is 138/86. She is taking Losartan HCT, Potassium and Amlodipine as prescribed. ECG from 01/2015 reviewed.  DM 2: Her last A1C was 6.6%, 04/2019. She is not taking any oral diabetic medication at this time. She does not check her sugars. She does not check her feet routinely. Her last eye exam was in 2020 at [facility] in [place].  Vit D Deficiency: Her last Vit D was 25, 04/2019. She is taking 5000 unitsVit D but no Calcium OTC at this time.  Review of Systems    Past Medical History:  Diagnosis Date  . Hypertension     Current Outpatient Medications  Medication Sig Dispense Refill  . amLODipine (NORVASC) 5 MG tablet Take 1 tablet (5 mg total) by mouth daily. 30 tablet 0  . fluticasone (FLONASE) 50 MCG/ACT nasal spray Place 2 sprays into both nostrils daily. (Patient not taking: Reported on 05/12/2019) 16 g 1  . losartan-hydrochlorothiazide (HYZAAR) 50-12.5 MG tablet TAKE 2 TABLETS BY MOUTH DAILY. 60 tablet 0  . potassium chloride 20 MEQ/15ML (10%) SOLN Take 7.5 mLs (10 mEq total) by mouth daily. 225 mL 2  . promethazine-dextromethorphan (PROMETHAZINE-DM) 6.25-15 MG/5ML syrup Take 5 mLs by mouth 4 (four) times daily as needed. 118 mL 0   No current facility-administered medications for this visit.    No Known Allergies  Family History  Problem Relation Age of Onset  . Hypertension Mother   . Diabetes Maternal Aunt   . Breast cancer Neg Hx     Social History   Socioeconomic History  . Marital status: Single    Spouse name: Not on file  . Number of children: Not on file  . Years of education: Not on file  . Highest education level: Not on file  Occupational History  . Not on file  Tobacco Use  . Smoking status: Never Smoker  . Smokeless tobacco: Never Used    Substance and Sexual Activity  . Alcohol use: Yes    Alcohol/week: 0.0 standard drinks    Comment: rare  . Drug use: No  . Sexual activity: Yes    Birth control/protection: Surgical  Other Topics Concern  . Not on file  Social History Narrative  . Not on file   Social Determinants of Health   Financial Resource Strain:   . Difficulty of Paying Living Expenses: Not on file  Food Insecurity:   . Worried About Programme researcher, broadcasting/film/video in the Last Year: Not on file  . Ran Out of Food in the Last Year: Not on file  Transportation Needs:   . Lack of Transportation (Medical): Not on file  . Lack of Transportation (Non-Medical): Not on file  Physical Activity:   . Days of Exercise per Week: Not on file  . Minutes of Exercise per Session: Not on file  Stress:   . Feeling of Stress : Not on file  Social Connections:   . Frequency of Communication with Friends and Family: Not on file  . Frequency of Social Gatherings with Friends and Family: Not on file  . Attends Religious Services: Not on file  . Active Member of Clubs or Organizations: Not on file  . Attends Banker Meetings: Not on file  . Marital Status: Not  on file  Intimate Partner Violence:   . Fear of Current or Ex-Partner: Not on file  . Emotionally Abused: Not on file  . Physically Abused: Not on file  . Sexually Abused: Not on file     Constitutional: Denies fever, malaise, fatigue, headache or abrupt weight changes.  HEENT: Denies eye pain, eye redness, ear pain, ringing in the ears, wax buildup, runny nose, nasal congestion, bloody nose, or sore throat. Respiratory: Denies difficulty breathing, shortness of breath, cough or sputum production.   Cardiovascular: Denies chest pain, chest tightness, palpitations or swelling in the hands or feet.  Skin: Denies redness, rashes, lesions or ulcercations.    No other specific complaints in a complete review of systems (except as listed in HPI above).      Objective:   Physical Exam  BP 138/86   Pulse 72   Temp 98.1 F (36.7 C) (Temporal)   Wt 269 lb (122 kg)   SpO2 98%   BMI 46.90 kg/m   Wt Readings from Last 3 Encounters:  04/24/19 270 lb (122.5 kg)  01/11/19 269 lb (122 kg)  12/21/18 271 lb (122.9 kg)    [name]: Appears her stated age, obese, in NAD. Skin: Warm, dry and intact. No ulcerations noted. Cardiovascular: Normal rate and rhythm. S1,S2 noted.  No murmur, rubs or gallops noted. No JVD or BLE edema.. Pulmonary/Chest: Normal effort and positive vesicular breath sounds. No respiratory distress. No wheezes, rales or ronchi noted.  Neurological: Alert and oriented.    BMET    Component Value Date/Time   NA 137 04/19/2019 0741   K 3.4 (L) 04/19/2019 0741   CL 100 04/19/2019 0741   CO2 28 04/19/2019 0741   GLUCOSE 122 (H) 04/19/2019 0741   BUN 10 04/19/2019 0741   CREATININE 0.79 04/19/2019 0741   CREATININE 0.99 01/05/2014 1353   CALCIUM 8.8 04/19/2019 0741    Lipid Panel     Component Value Date/Time   CHOL 145 12/21/2018 1457   TRIG 187.0 (H) 12/21/2018 1457   HDL 34.30 (L) 12/21/2018 1457   CHOLHDL 4 12/21/2018 1457   VLDL 37.4 12/21/2018 1457   LDLCALC 74 12/21/2018 1457    CBC    Component Value Date/Time   WBC 4.6 12/21/2018 1457   RBC 4.41 12/21/2018 1457   HGB 11.5 (L) 12/21/2018 1457   HCT 35.6 (L) 12/21/2018 1457   PLT 262.0 12/21/2018 1457   MCV 80.6 12/21/2018 1457   MCH 27.9 01/05/2014 1353   MCHC 32.3 12/21/2018 1457   RDW 18.4 (H) 12/21/2018 1457    Hgb A1C Lab Results  Component Value Date   HGBA1C 6.6 (H) 04/19/2019         Assessment & Plan:     Webb Silversmith, NP This visit occurred during the SARS-CoV-2 public health emergency.  Safety protocols were in place, including screening questions prior to the visit, additional usage of staff PPE, and extensive cleaning of exam room while observing appropriate contact time as indicated for disinfecting solutions.

## 2019-07-25 NOTE — Patient Instructions (Signed)
I want to increase your Amlodipine to 10 mg daily. Please take 2 5mg  until you run out. I would like you to schedule a nurse visit for 2 weeks for a BP check  If you are interested in learning more about bariatric surgery (specifically gastric sleeve), go to the (located in [place]) and click on the [facility] for bariatric surgery. I think you could benefit from losing 100 lbs.   In the meantime, try to stick to a 1200 Keto type diet (meat, fresh veggies. Diet focuses on avoiding carbs, fruits and unhealthy fats)  Your blood pressure needs to be ideally < 120/80.  Please start taking 800 mg of Calcium daily along with your Vit D.

## 2019-07-25 NOTE — Assessment & Plan Note (Signed)
Vit D lever today Encouraged her to continue Vit D OTC but to add Calcium 800 mg along with it for better apsorption

## 2019-07-25 NOTE — Assessment & Plan Note (Signed)
BMET today Continue Potassium supplement

## 2019-07-25 NOTE — Assessment & Plan Note (Signed)
Uncontrolled Increase Amlodipine to 10 mg daily Continue Losartan HCT and Potassium BMET today Reinforced DASH diet and exercise for weight loss

## 2019-08-09 ENCOUNTER — Ambulatory Visit: Payer: 59

## 2019-08-09 ENCOUNTER — Other Ambulatory Visit: Payer: Self-pay

## 2019-08-09 VITALS — BP 138/86

## 2019-08-09 DIAGNOSIS — I10 Essential (primary) hypertension: Secondary | ICD-10-CM

## 2019-08-09 MED ORDER — VITAMIN D (ERGOCALCIFEROL) 1.25 MG (50000 UNIT) PO CAPS: 50000.0000 [IU] | ORAL_CAPSULE | ORAL | 0 refills | Status: DC

## 2019-08-09 MED ORDER — METFORMIN HCL 500 MG PO TABS: 500.0000 mg | ORAL_TABLET | Freq: Two times a day (BID) | ORAL | 2 refills | Status: DC

## 2019-08-09 NOTE — Progress Notes (Signed)
Pt came in for BP check. It was 138/86.  She asked for her lab results stating she cannot get on MyChart. I printed them out and highlighted your statement. I have added notes to her lab results. I sent the metformin in this nurse visit. The Vitamin D was sent to the pharmacy, also.  Marland Kitchen

## 2019-08-09 NOTE — Addendum Note (Signed)
Addended by: Roena Malady on: 08/09/2019 09:11 AM   Modules accepted: Orders

## 2019-08-09 NOTE — Progress Notes (Signed)
Verify that she is taking 2 tabs of Losartan HCT. BP unchanged from prior. Would recommend adding Amlodipine 5 mg 1 tab PO daily if agreeable #30, 0 refills. Follow up in office/virtual in 2 weeks. Nicki Reaper, NP

## 2019-08-17 ENCOUNTER — Other Ambulatory Visit: Payer: Self-pay | Admitting: Internal Medicine

## 2019-08-17 ENCOUNTER — Telehealth: Payer: Self-pay | Admitting: Internal Medicine

## 2019-08-17 NOTE — Telephone Encounter (Signed)
Patient called in regards to scheduling a follow up visit with you  She stated she had the BP check but thought you would want her back but she stated she was not told when.

## 2019-08-17 NOTE — Telephone Encounter (Signed)
2 weeks from 08/09/19

## 2019-08-23 ENCOUNTER — Ambulatory Visit (INDEPENDENT_AMBULATORY_CARE_PROVIDER_SITE_OTHER): Payer: 59 | Admitting: Internal Medicine

## 2019-08-23 ENCOUNTER — Other Ambulatory Visit: Payer: Self-pay

## 2019-08-23 ENCOUNTER — Encounter: Payer: Self-pay | Admitting: Internal Medicine

## 2019-08-23 ENCOUNTER — Ambulatory Visit: Payer: 59 | Admitting: Internal Medicine

## 2019-08-23 DIAGNOSIS — I10 Essential (primary) hypertension: Secondary | ICD-10-CM

## 2019-08-23 MED ORDER — AMLODIPINE BESYLATE 10 MG PO TABS: 10.0000 mg | ORAL_TABLET | Freq: Every day | ORAL | 3 refills | Status: DC

## 2019-08-23 NOTE — Progress Notes (Signed)
Subjective:    Patient ID: Kathy [name], female    DOB: June 13, 1975, 44 y.o.   MRN: [id number]  HPI  Patient presents to the clinic today for 2-week follow-up of hypertension.  At her last visit her Amlodipine was increased to 10 mg daily has been taking the medication as prescribed along with Losartan HCT and potassium supplement.  She denies adverse side effects.  Her BP today is 126/84.  ECG from 01/2015 reviewed.  Review of Systems      Past Medical History:  Diagnosis Date  . Hypertension     Current Outpatient Medications  Medication Sig Dispense Refill  . amLODipine (NORVASC) 5 MG tablet TAKE 1 TABLET (5 MG TOTAL) BY MOUTH DAILY. 30 tablet 0  . fluticasone (FLONASE) 50 MCG/ACT nasal spray Place 2 sprays into both nostrils daily. 16 g 1  . losartan-hydrochlorothiazide (HYZAAR) 50-12.5 MG tablet TAKE 2 TABLETS BY MOUTH DAILY. 60 tablet 0  . metFORMIN (GLUCOPHAGE) 500 MG tablet Take 1 tablet (500 mg total) by mouth 2 (two) times daily with a meal. 60 tablet 2  . potassium chloride 20 MEQ/15ML (10%) SOLN Take 7.5 mLs (10 mEq total) by mouth daily. 225 mL 2  . Vitamin D, Ergocalciferol, (DRISDOL) 1.25 MG (50000 UNIT) CAPS capsule Take 1 capsule (50,000 Units total) by mouth every 7 (seven) days. 12 capsule 0   No current facility-administered medications for this visit.    No Known Allergies  Family History  Problem Relation Age of Onset  . Hypertension Mother   . Diabetes Maternal Aunt   . Breast cancer Neg Hx     Social History   Socioeconomic History  . Marital status: Single    Spouse name: Not on file  . Number of children: Not on file  . Years of education: Not on file  . Highest education level: Not on file  Occupational History  . Not on file  Tobacco Use  . Smoking status: Never Smoker  . Smokeless tobacco: Never Used  Substance and Sexual Activity  . Alcohol use: Yes    Alcohol/week: 0.0 standard drinks    Comment: rare  . Drug use: No  .  Sexual activity: Yes    Birth control/protection: Surgical  Other Topics Concern  . Not on file  Social History Narrative  . Not on file   Social Determinants of Health   Financial Resource Strain:   . Difficulty of Paying Living Expenses:   Food Insecurity:   . Worried About Programme researcher, broadcasting/film/video in the Last Year:   . Barista in the Last Year:   Transportation Needs:   . Freight forwarder (Medical):   Marland Kitchen Lack of Transportation (Non-Medical):   Physical Activity:   . Days of Exercise per Week:   . Minutes of Exercise per Session:   Stress:   . Feeling of Stress :   Social Connections:   . Frequency of Communication with Friends and Family:   . Frequency of Social Gatherings with Friends and Family:   . Attends Religious Services:   . Active Member of Clubs or Organizations:   . Attends Banker Meetings:   Marland Kitchen Marital Status:   Intimate Partner Violence:   . Fear of Current or Ex-Partner:   . Emotionally Abused:   Marland Kitchen Physically Abused:   . Sexually Abused:      Constitutional: Denies fever, malaise, fatigue, headache or abrupt weight changes.  Respiratory: Denies difficulty breathing, shortness  of breath, cough or sputum production.   Cardiovascular: Denies chest pain, chest tightness, palpitations or swelling in the hands or feet.  Neurological: Denies dizziness, difficulty with memory, difficulty with speech or problems with balance and coordination.    No other specific complaints in a complete review of systems (except as listed in HPI above).  Objective:   Physical Exam  BP 126/84   Pulse 92   Temp 97.6 F (36.4 C) (Temporal)   Wt 271 lb (122.9 kg)   SpO2 98%   BMI 47.25 kg/m   Wt Readings from Last 3 Encounters:  07/25/19 269 lb (122 kg)  04/24/19 270 lb (122.5 kg)  01/11/19 269 lb (122 kg)    [name]: Appears her stated age, obese, in NAD. Cardiovascular: Normal rate and rhythm. S1,S2 noted.  No murmur, rubs or gallops noted.  No JVD or BLE edema.  Pulmonary/Chest: Normal effort and positive vesicular breath sounds. No respiratory distress. No wheezes, rales or ronchi noted.  Neurological: Alert and oriented.    BMET    Component Value Date/Time   NA 134 (L) 07/25/2019 0906   K 3.3 (L) 07/25/2019 0906   CL 100 07/25/2019 0906   CO2 29 07/25/2019 0906   GLUCOSE 151 (H) 07/25/2019 0906   BUN 9 07/25/2019 0906   CREATININE 0.69 07/25/2019 0906   CREATININE 0.99 01/05/2014 1353   CALCIUM 8.8 07/25/2019 0906    Lipid Panel     Component Value Date/Time   CHOL 142 07/25/2019 0906   TRIG 248.0 (H) 07/25/2019 0906   HDL 34.70 (L) 07/25/2019 0906   CHOLHDL 4 07/25/2019 0906   VLDL 49.6 (H) 07/25/2019 0906   LDLCALC 74 12/21/2018 1457    CBC    Component Value Date/Time   WBC 4.6 12/21/2018 1457   RBC 4.41 12/21/2018 1457   HGB 11.5 (L) 12/21/2018 1457   HCT 35.6 (L) 12/21/2018 1457   PLT 262.0 12/21/2018 1457   MCV 80.6 12/21/2018 1457   MCH 27.9 01/05/2014 1353   MCHC 32.3 12/21/2018 1457   RDW 18.4 (H) 12/21/2018 1457    Hgb A1C Lab Results  Component Value Date   HGBA1C 7.6 (H) 07/25/2019           Assessment & Plan:   Webb Silversmith, NP This visit occurred during the SARS-CoV-2 public health emergency.  Safety protocols were in place, including screening questions prior to the visit, additional usage of staff PPE, and extensive cleaning of exam room while observing appropriate contact time as indicated for disinfecting solutions.

## 2019-08-23 NOTE — Patient Instructions (Signed)

## 2019-08-23 NOTE — Assessment & Plan Note (Signed)
Controlled on Amlodipine and Losartan HCT Reinforced DASH diet and exercise for weight loss

## 2019-09-20 ENCOUNTER — Other Ambulatory Visit: Payer: Self-pay | Admitting: Internal Medicine

## 2019-10-05 ENCOUNTER — Other Ambulatory Visit: Payer: Self-pay | Admitting: Internal Medicine

## 2019-10-24 ENCOUNTER — Ambulatory Visit: Payer: 59 | Admitting: Internal Medicine

## 2019-10-25 ENCOUNTER — Encounter: Payer: Self-pay | Admitting: Internal Medicine

## 2019-10-25 ENCOUNTER — Ambulatory Visit (INDEPENDENT_AMBULATORY_CARE_PROVIDER_SITE_OTHER): Payer: 59 | Admitting: Internal Medicine

## 2019-10-25 ENCOUNTER — Other Ambulatory Visit: Payer: Self-pay

## 2019-10-25 VITALS — BP 132/78 | HR 78 | Temp 97.4°F | Wt 273.0 lb

## 2019-10-25 DIAGNOSIS — E559 Vitamin D deficiency, unspecified: Secondary | ICD-10-CM

## 2019-10-25 DIAGNOSIS — I10 Essential (primary) hypertension: Secondary | ICD-10-CM | POA: Diagnosis not present

## 2019-10-25 DIAGNOSIS — Z23 Encounter for immunization: Secondary | ICD-10-CM

## 2019-10-25 DIAGNOSIS — E876 Hypokalemia: Secondary | ICD-10-CM | POA: Diagnosis not present

## 2019-10-25 DIAGNOSIS — T502X5A Adverse effect of carbonic-anhydrase inhibitors, benzothiadiazides and other diuretics, initial encounter: Secondary | ICD-10-CM

## 2019-10-25 DIAGNOSIS — R7303 Prediabetes: Secondary | ICD-10-CM | POA: Diagnosis not present

## 2019-10-25 NOTE — Addendum Note (Signed)
Addended by: Roena Malady on: 10/25/2019 05:15 PM   Modules accepted: Orders

## 2019-10-25 NOTE — Progress Notes (Signed)
Subjective:    Patient ID: Kathy [name], female    DOB: [birth year], 44 y.o.   MRN: [id number]  HPI  Pt presents to the clinic today for follow up chronic conditions.  HTN: Her BP today is 132/78. She is taking Losartan-HCT and Amlodipine as prescribed. ECG from 08/2014 reviewed.  Vit D Deficiency: Her last Vit D was 12.36, 07/2019. She has finished 3 months of Ergocalciferol. She is due for repeat Vit D level today.  DM 2: Her last A1C was 7.6%, LDL 91, 07/2019. She is taking Metformin as prescribed. She is not checking her sugars. She checks her feet routinely. Her last eye exam was 04/2019- [facility] ([place]). Flu 03/2019. Pneumovax. Covid 06/2019, 07/2019.  Review of Systems      Past Medical History:  Diagnosis Date  . Hypertension     Current Outpatient Medications  Medication Sig Dispense Refill  . amLODipine (NORVASC) 10 MG tablet Take 1 tablet (10 mg total) by mouth daily. 90 tablet 3  . fluticasone (FLONASE) 50 MCG/ACT nasal spray Place 2 sprays into both nostrils daily. 16 g 1  . losartan-hydrochlorothiazide (HYZAAR) 50-12.5 MG tablet TAKE 2 TABLETS BY MOUTH DAILY. 60 tablet 0  . metFORMIN (GLUCOPHAGE) 500 MG tablet Take 1 tablet (500 mg total) by mouth 2 (two) times daily with a meal. 60 tablet 2  . potassium chloride 20 MEQ/15ML (10%) SOLN Take 7.5 mLs (10 mEq total) by mouth daily. 225 mL 2  . potassium chloride SA (KLOR-CON) 20 MEQ tablet TAKE 1 TABLET (20 MEQ TOTAL) BY MOUTH 2 (TWO) TIMES DAILY. 180 tablet 0  . Vitamin D, Ergocalciferol, (DRISDOL) 1.25 MG (50000 UNIT) CAPS capsule Take 1 capsule (50,000 Units total) by mouth every 7 (seven) days. 12 capsule 0   No current facility-administered medications for this visit.    No Known Allergies  Family History  Problem Relation Age of Onset  . Hypertension Mother   . Diabetes Maternal Aunt   . Breast cancer Neg Hx     Social History   Socioeconomic History  . Marital status: Single    Spouse  name: Not on file  . Number of children: Not on file  . Years of education: Not on file  . Highest education level: Not on file  Occupational History  . Not on file  Tobacco Use  . Smoking status: Never Smoker  . Smokeless tobacco: Never Used  Substance and Sexual Activity  . Alcohol use: Yes    Alcohol/week: 0.0 standard drinks    Comment: rare  . Drug use: No  . Sexual activity: Yes    Birth control/protection: Surgical  Other Topics Concern  . Not on file  Social History Narrative  . Not on file   Social Determinants of Health   Financial Resource Strain:   . Difficulty of Paying Living Expenses:   Food Insecurity:   . Worried About Programme researcher, broadcasting/film/video in the Last Year:   . Barista in the Last Year:   Transportation Needs:   . Freight forwarder (Medical):   Marland Kitchen Lack of Transportation (Non-Medical):   Physical Activity:   . Days of Exercise per Week:   . Minutes of Exercise per Session:   Stress:   . Feeling of Stress :   Social Connections:   . Frequency of Communication with Friends and Family:   . Frequency of Social Gatherings with Friends and Family:   . Attends Religious Services:   .  Active Member of Clubs or Organizations:   . Attends Archivist Meetings:   Marland Kitchen Marital Status:   Intimate Partner Violence:   . Fear of Current or Ex-Partner:   . Emotionally Abused:   Marland Kitchen Physically Abused:   . Sexually Abused:      Constitutional: Denies fever, malaise, fatigue, headache or abrupt weight changes.  HEENT: Denies eye pain, eye redness, ear pain, ringing in the ears, wax buildup, runny nose, nasal congestion, bloody nose, or sore throat. Respiratory: Denies difficulty breathing, shortness of breath, cough or sputum production.   Cardiovascular: Denies chest pain, chest tightness, palpitations or swelling in the hands or feet.  Gastrointestinal: Pt reports increased thirst. Denies abdominal pain, bloating, constipation, diarrhea or blood in  the stool.  GU: Denies urgency, frequency, pain with urination, burning sensation, blood in urine, odor or discharge. Musculoskeletal: Denies decrease in range of motion, difficulty with gait, muscle pain or joint pain and swelling.  Skin: Denies redness, rashes, lesions or ulcercations.  Neurological: Denies dizziness, difficulty with memory, difficulty with speech or problems with balance and coordination.  Psych: Denies anxiety, depression, SI/HI.  No other specific complaints in a complete review of systems (except as listed in HPI above).  Objective:   Physical Exam   BP 132/78   Pulse 78   Temp (!) 97.4 F (36.3 C) (Temporal)   Wt 273 lb (123.8 kg)   SpO2 98%   BMI 47.60 kg/m   Wt Readings from Last 3 Encounters:  08/23/19 271 lb (122.9 kg)  07/25/19 269 lb (122 kg)  04/24/19 270 lb (122.5 kg)    [name]: Appears her stated age, obese, in NAD. Skin: Warm, dry and intact. No ulcerations noted. Cardiovascular: Normal rate and rhythm. S1,S2 noted.  No murmur, rubs or gallops noted. Trace pitting BLE edema.  Pulmonary/Chest: Normal effort and positive vesicular breath sounds. No respiratory distress. No wheezes, rales or ronchi noted.  Neurological: Alert and oriented.    BMET    Component Value Date/Time   NA 134 (L) 07/25/2019 0906   K 3.3 (L) 07/25/2019 0906   CL 100 07/25/2019 0906   CO2 29 07/25/2019 0906   GLUCOSE 151 (H) 07/25/2019 0906   BUN 9 07/25/2019 0906   CREATININE 0.69 07/25/2019 0906   CREATININE 0.99 01/05/2014 1353   CALCIUM 8.8 07/25/2019 0906    Lipid Panel     Component Value Date/Time   CHOL 142 07/25/2019 0906   TRIG 248.0 (H) 07/25/2019 0906   HDL 34.70 (L) 07/25/2019 0906   CHOLHDL 4 07/25/2019 0906   VLDL 49.6 (H) 07/25/2019 0906   LDLCALC 74 12/21/2018 1457    CBC    Component Value Date/Time   WBC 4.6 12/21/2018 1457   RBC 4.41 12/21/2018 1457   HGB 11.5 (L) 12/21/2018 1457   HCT 35.6 (L) 12/21/2018 1457   PLT 262.0  12/21/2018 1457   MCV 80.6 12/21/2018 1457   MCH 27.9 01/05/2014 1353   MCHC 32.3 12/21/2018 1457   RDW 18.4 (H) 12/21/2018 1457    Hgb A1C Lab Results  Component Value Date   HGBA1C 7.6 (H) 07/25/2019           Assessment & Plan:   Webb Silversmith, NP This visit occurred during the SARS-CoV-2 public health emergency.  Safety protocols were in place, including screening questions prior to the visit, additional usage of staff PPE, and extensive cleaning of exam room while observing appropriate contact time as indicated for disinfecting solutions.

## 2019-10-25 NOTE — Assessment & Plan Note (Signed)
Vit D today 

## 2019-10-25 NOTE — Patient Instructions (Signed)

## 2019-10-25 NOTE — Assessment & Plan Note (Signed)
Continue Losartan HCT CMET today Reinforced DASH diet and exercise for weight loss 

## 2019-10-25 NOTE — Assessment & Plan Note (Signed)
CMET today 

## 2019-10-25 NOTE — Assessment & Plan Note (Signed)
A1C today No urine microalbumin secondary to ARB therapy Encouraged her to consume a low carb diet and exercise for weight loss Continue Metformin Encouraged yearly eye exam Encouraged routine foot exam.  Flu and Covid UTD Pneumovax today

## 2019-10-26 LAB — VITAMIN D 25 HYDROXY (VIT D DEFICIENCY, FRACTURES): VITD: 28.7 ng/mL — ABNORMAL LOW (ref 30.00–100.00)

## 2019-10-26 LAB — BASIC METABOLIC PANEL
BUN: 10 mg/dL (ref 6–23)
CO2: 27 mEq/L (ref 19–32)
Calcium: 9.3 mg/dL (ref 8.4–10.5)
Chloride: 99 mEq/L (ref 96–112)
Creatinine, Ser: 0.67 mg/dL (ref 0.40–1.20)
GFR: 116.7 mL/min (ref >60.00)
Glucose, Bld: 145 mg/dL — ABNORMAL HIGH (ref 70–99)
Potassium: 3.4 mEq/L — ABNORMAL LOW (ref 3.5–5.1)
Sodium: 133 mEq/L — ABNORMAL LOW (ref 135–145)

## 2019-10-26 LAB — HEMOGLOBIN A1C: Hgb A1c MFr Bld: 6.6 % — ABNORMAL HIGH (ref 4.6–6.5)

## 2019-10-27 ENCOUNTER — Encounter: Payer: Self-pay | Admitting: Internal Medicine

## 2019-10-27 MED ORDER — LOSARTAN POTASSIUM 50 MG PO TABS: 50.0000 mg | ORAL_TABLET | Freq: Every day | ORAL | 0 refills | Status: DC

## 2019-10-27 MED ORDER — SPIRONOLACTONE 25 MG PO TABS: 25.0000 mg | ORAL_TABLET | Freq: Every day | ORAL | 0 refills | Status: DC

## 2019-10-27 NOTE — Addendum Note (Signed)
Addended by: Roena Malady on: 10/27/2019 09:29 AM   Modules accepted: Orders

## 2019-11-10 ENCOUNTER — Encounter: Payer: Self-pay | Admitting: Internal Medicine

## 2019-11-10 ENCOUNTER — Other Ambulatory Visit: Payer: Self-pay

## 2019-11-10 ENCOUNTER — Ambulatory Visit: Payer: 59 | Admitting: Internal Medicine

## 2019-11-10 VITALS — BP 130/76 | HR 79 | Temp 97.9°F | Wt 273.0 lb

## 2019-11-10 DIAGNOSIS — E876 Hypokalemia: Secondary | ICD-10-CM | POA: Diagnosis not present

## 2019-11-10 DIAGNOSIS — I10 Essential (primary) hypertension: Secondary | ICD-10-CM

## 2019-11-10 DIAGNOSIS — T502X5A Adverse effect of carbonic-anhydrase inhibitors, benzothiadiazides and other diuretics, initial encounter: Secondary | ICD-10-CM

## 2019-11-10 NOTE — Patient Instructions (Signed)

## 2019-11-10 NOTE — Assessment & Plan Note (Signed)
Hopefully resolved with stopping HCTZ and adding Spironolactone

## 2019-11-10 NOTE — Assessment & Plan Note (Signed)
Stable Repeat BMET today Reinforced DASH diet and exercise for weight loss.

## 2019-11-10 NOTE — Progress Notes (Signed)
Subjective:    Patient ID: Kathy [name], female    DOB: [birth year], 44 y.o.   MRN: [id number]  HPI  Patient presents clinic today for 2-week HTN.  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  She has been taking the medication as prescribed.  She denies adverse side effects.  Her BP today is 130/76. ECG from 01/2015 reviewed.  Review of Systems      Past Medical History:  Diagnosis Date  . Hypertension     Current Outpatient Medications  Medication Sig Dispense Refill  . amLODipine (NORVASC) 10 MG tablet Take 1 tablet (10 mg total) by mouth daily. 90 tablet 3  . fluticasone (FLONASE) 50 MCG/ACT nasal spray Place 2 sprays into both nostrils daily. 16 g 1  . losartan (COZAAR) 50 MG tablet Take 1 tablet (50 mg total) by mouth daily. 90 tablet 0  . metFORMIN (GLUCOPHAGE) 500 MG tablet Take 1 tablet (500 mg total) by mouth 2 (two) times daily with a meal. 60 tablet 2  . spironolactone (ALDACTONE) 25 MG tablet Take 1 tablet (25 mg total) by mouth daily. 30 tablet 0   No current facility-administered medications for this visit.    No Known Allergies  Family History  Problem Relation Age of Onset  . Hypertension Mother   . Diabetes Maternal Aunt   . Breast cancer Neg Hx     Social History   Socioeconomic History  . Marital status: Single    Spouse name: Not on file  . Number of children: Not on file  . Years of education: Not on file  . Highest education level: Not on file  Occupational History  . Not on file  Tobacco Use  . Smoking status: Never Smoker  . Smokeless tobacco: Never Used  Substance and Sexual Activity  . Alcohol use: Yes    Alcohol/week: 0.0 standard drinks    Comment: rare  . Drug use: No  . Sexual activity: Yes    Birth control/protection: Surgical  Other Topics Concern  . Not on file  Social History Narrative  . Not on file   Social Determinants of Health    Financial Resource Strain:   . Difficulty of Paying Living Expenses:   Food Insecurity:   . Worried About Programme researcher, broadcasting/film/video in the Last Year:   . Barista in the Last Year:   Transportation Needs:   . Freight forwarder (Medical):   Marland Kitchen Lack of Transportation (Non-Medical):   Physical Activity:   . Days of Exercise per Week:   . Minutes of Exercise per Session:   Stress:   . Feeling of Stress :   Social Connections:   . Frequency of Communication with Friends and Family:   . Frequency of Social Gatherings with Friends and Family:   . Attends Religious Services:   . Active Member of Clubs or Organizations:   . Attends Banker Meetings:   Marland Kitchen Marital Status:   Intimate Partner Violence:   . Fear of Current or Ex-Partner:   . Emotionally Abused:   Marland Kitchen Physically Abused:   . Sexually Abused:      Constitutional: Denies fever, malaise, fatigue, headache or abrupt weight changes.  Respiratory: Denies difficulty breathing, shortness of breath, cough or sputum production.   Cardiovascular: Denies chest pain, chest tightness, palpitations or swelling in the hands or feet.  Neurological: Denies dizziness,  difficulty with memory, difficulty with speech or problems with balance and coordination.    No other specific complaints in a complete review of systems (except as listed in HPI above).  Objective:   Physical Exam BP 130/76   Pulse 79   Temp 97.9 F (36.6 C) (Temporal)   Wt 273 lb (123.8 kg)   SpO2 98%   BMI 47.60 kg/m   Wt Readings from Last 3 Encounters:  10/25/19 273 lb (123.8 kg)  08/23/19 271 lb (122.9 kg)  07/25/19 269 lb (122 kg)    [name]: Appears her stated age, obese, in NAD. Cardiovascular: Normal rate and rhythm. S1,S2 noted. No BLE edema. Pulmonary/Chest: Normal effort and positive vesicular breath sounds. No respiratory distress. No wheezes, rales or ronchi noted.  Neurological: Alert and oriented.    BMET    Component Value  Date/Time   NA 133 (L) 10/25/2019 1639   K 3.4 (L) 10/25/2019 1639   CL 99 10/25/2019 1639   CO2 27 10/25/2019 1639   GLUCOSE 145 (H) 10/25/2019 1639   BUN 10 10/25/2019 1639   CREATININE 0.67 10/25/2019 1639   CREATININE 0.99 01/05/2014 1353   CALCIUM 9.3 10/25/2019 1639    Lipid Panel     Component Value Date/Time   CHOL 142 07/25/2019 0906   TRIG 248.0 (H) 07/25/2019 0906   HDL 34.70 (L) 07/25/2019 0906   CHOLHDL 4 07/25/2019 0906   VLDL 49.6 (H) 07/25/2019 0906   LDLCALC 74 12/21/2018 1457    CBC    Component Value Date/Time   WBC 4.6 12/21/2018 1457   RBC 4.41 12/21/2018 1457   HGB 11.5 (L) 12/21/2018 1457   HCT 35.6 (L) 12/21/2018 1457   PLT 262.0 12/21/2018 1457   MCV 80.6 12/21/2018 1457   MCH 27.9 01/05/2014 1353   MCHC 32.3 12/21/2018 1457   RDW 18.4 (H) 12/21/2018 1457    Hgb A1C Lab Results  Component Value Date   HGBA1C 6.6 (H) 10/25/2019           Assessment & Plan:    Webb Silversmith, NP This visit occurred during the SARS-CoV-2 public health emergency.  Safety protocols were in place, including screening questions prior to the visit, additional usage of staff PPE, and extensive cleaning of exam room while observing appropriate contact time as indicated for disinfecting solutions.

## 2019-11-11 LAB — BASIC METABOLIC PANEL
BUN: 10 mg/dL (ref 7–25)
CO2: 25 mmol/L (ref 20–32)
Calcium: 8.9 mg/dL (ref 8.6–10.2)
Chloride: 103 mmol/L (ref 98–110)
Creat: 0.72 mg/dL (ref 0.50–1.10)
Glucose, Bld: 126 mg/dL — ABNORMAL HIGH (ref 65–99)
Potassium: 3.7 mmol/L (ref 3.5–5.3)
Sodium: 136 mmol/L (ref 135–146)

## 2020-01-19 ENCOUNTER — Other Ambulatory Visit: Payer: Self-pay | Admitting: Internal Medicine

## 2020-08-19 ENCOUNTER — Encounter: Payer: 59 | Admitting: Internal Medicine

## 2020-08-28 ENCOUNTER — Encounter: Payer: Self-pay | Admitting: Internal Medicine

## 2020-08-28 ENCOUNTER — Other Ambulatory Visit: Payer: Self-pay | Admitting: Internal Medicine

## 2020-08-28 ENCOUNTER — Other Ambulatory Visit: Payer: Self-pay

## 2020-08-28 ENCOUNTER — Ambulatory Visit (INDEPENDENT_AMBULATORY_CARE_PROVIDER_SITE_OTHER): Payer: 59 | Admitting: Internal Medicine

## 2020-08-28 VITALS — BP 142/98 | HR 70 | Temp 98.3°F | Ht 63.5 in | Wt 269.0 lb

## 2020-08-28 DIAGNOSIS — Z1159 Encounter for screening for other viral diseases: Secondary | ICD-10-CM | POA: Diagnosis not present

## 2020-08-28 DIAGNOSIS — I10 Essential (primary) hypertension: Secondary | ICD-10-CM | POA: Diagnosis not present

## 2020-08-28 DIAGNOSIS — Z0000 Encounter for general adult medical examination without abnormal findings: Secondary | ICD-10-CM

## 2020-08-28 DIAGNOSIS — Z0001 Encounter for general adult medical examination with abnormal findings: Secondary | ICD-10-CM

## 2020-08-28 DIAGNOSIS — R7303 Prediabetes: Secondary | ICD-10-CM | POA: Diagnosis not present

## 2020-08-28 MED ORDER — SPIRONOLACTONE 25 MG PO TABS: 25.0000 mg | ORAL_TABLET | Freq: Every day | ORAL | 0 refills | Status: DC

## 2020-08-28 NOTE — Patient Instructions (Signed)
Health Maintenance, Female Adopting a healthy lifestyle and getting preventive care are important in promoting health and wellness. Ask your health care provider about:  The right schedule for you to have regular tests and exams.  Things you can do on your own to prevent diseases and keep yourself healthy. What should I know about diet, weight, and exercise? Eat a healthy diet  Eat a diet that includes plenty of vegetables, fruits, low-fat dairy products, and lean protein.  Do not eat a lot of foods that are high in solid fats, added sugars, or sodium.   Maintain a healthy weight Body mass index (BMI) is used to identify weight problems. It estimates body fat based on height and weight. Your health care provider can help determine your BMI and help you achieve or maintain a healthy weight. Get regular exercise Get regular exercise. This is one of the most important things you can do for your health. Most adults should:  Exercise for at least 150 minutes each week. The exercise should increase your heart rate and make you sweat (moderate-intensity exercise).  Do strengthening exercises at least twice a week. This is in addition to the moderate-intensity exercise.  Spend less time sitting. Even light physical activity can be beneficial. Watch cholesterol and blood lipids Have your blood tested for lipids and cholesterol at 43 years of age, then have this test every 5 years. Have your cholesterol levels checked more often if:  Your lipid or cholesterol levels are high.  You are older than 43 years of age.  You are at high risk for heart disease. What should I know about cancer screening? Depending on your health history and family history, you may need to have cancer screening at various ages. This may include screening for:  Breast cancer.  Cervical cancer.  Colorectal cancer.  Skin cancer.  Lung cancer. What should I know about heart disease, diabetes, and high blood  pressure? Blood pressure and heart disease  High blood pressure causes heart disease and increases the risk of stroke. This is more likely to develop in people who have high blood pressure readings, are of African descent, or are overweight.  Have your blood pressure checked: ? Every 3-5 years if you are 18-39 years of age. ? Every year if you are 40 years old or older. Diabetes Have regular diabetes screenings. This checks your fasting blood sugar level. Have the screening done:  Once every three years after age 40 if you are at a normal weight and have a low risk for diabetes.  More often and at a younger age if you are overweight or have a high risk for diabetes. What should I know about preventing infection? Hepatitis B If you have a higher risk for hepatitis B, you should be screened for this virus. Talk with your health care provider to find out if you are at risk for hepatitis B infection. Hepatitis C Testing is recommended for:  Everyone born from 1945 through 1965.  Anyone with known risk factors for hepatitis C. Sexually transmitted infections (STIs)  Get screened for STIs, including gonorrhea and chlamydia, if: ? You are sexually active and are younger than 43 years of age. ? You are older than 43 years of age and your health care provider tells you that you are at risk for this type of infection. ? Your sexual activity has changed since you were last screened, and you are at increased risk for chlamydia or gonorrhea. Ask your health care provider   if you are at risk.  Ask your health care provider about whether you are at high risk for HIV. Your health care provider may recommend a prescription medicine to help prevent HIV infection. If you choose to take medicine to prevent HIV, you should first get tested for HIV. You should then be tested every 3 months for as long as you are taking the medicine. Pregnancy  If you are about to stop having your period (premenopausal) and  you may become pregnant, seek counseling before you get pregnant.  Take 400 to 800 micrograms (mcg) of folic acid every day if you become pregnant.  Ask for birth control (contraception) if you want to prevent pregnancy. Osteoporosis and menopause Osteoporosis is a disease in which the bones lose minerals and strength with aging. This can result in bone fractures. If you are 65 years old or older, or if you are at risk for osteoporosis and fractures, ask your health care provider if you should:  Be screened for bone loss.  Take a calcium or vitamin D supplement to lower your risk of fractures.  Be given hormone replacement therapy (HRT) to treat symptoms of menopause. Follow these instructions at home: Lifestyle  Do not use any products that contain nicotine or tobacco, such as cigarettes, e-cigarettes, and chewing tobacco. If you need help quitting, ask your health care provider.  Do not use street drugs.  Do not share needles.  Ask your health care provider for help if you need support or information about quitting drugs. Alcohol use  Do not drink alcohol if: ? Your health care provider tells you not to drink. ? You are pregnant, may be pregnant, or are planning to become pregnant.  If you drink alcohol: ? Limit how much you use to 0-1 drink a day. ? Limit intake if you are breastfeeding.  Be aware of how much alcohol is in your drink. In the [place] one drink equals one 12 oz bottle of beer (355 mL), one 5 oz glass of wine (148 mL), or one 1 oz glass of hard liquor (44 mL). General instructions  Schedule regular health, dental, and eye exams.  Stay current with your vaccines.  Tell your health care provider if: ? You often feel depressed. ? You have ever been abused or do not feel safe at home. Summary  Adopting a healthy lifestyle and getting preventive care are important in promoting health and wellness.  Follow your health care provider's instructions about healthy  diet, exercising, and getting tested or screened for diseases.  Follow your health care provider's instructions on monitoring your cholesterol and blood pressure. This information is not intended to replace advice given to you by your health care provider. Make sure you discuss any questions you have with your health care provider. Document Revised: 05/18/2018 Document Reviewed: 05/18/2018 Elsevier Patient Education  2021 Elsevier Inc.  

## 2020-08-28 NOTE — Assessment & Plan Note (Signed)
A1C today No urine microalbumin secondary to ARB therapy Encouraged her to consume a low fat, low carb diet and exercise for weight loss Encouraged routine eye exams Encouraged routine foot exams Flu and pneumonia vaccine UTD She declines covid booster

## 2020-08-28 NOTE — Assessment & Plan Note (Signed)
Uncontrolled but she is only take Losartan and Amlodipine Advised her to get Spironolactone and start taking as prescribe, RX sent to pharmacy Reinforced DASH diet and exercise for weight loss CMET today

## 2020-08-28 NOTE — Progress Notes (Signed)
Subjective:    Patient ID: Kathy [name], female    DOB: [birth year], 45 y.o.   MRN: [id number]  HPI  Patient presents to the clinic today for her annual exam.  She is also due to follow-up chronic conditions.  HTN: Her BP today is 142/98.  She is taking Losartan, Amlodipine. She reports she is not taking Spironolactone as prescribed.  ECG from 01/2015 reviewed.  DM2: Her last A1c was 6.6%, 10/2019.  She is taking Metformin as prescribed.  She does not check her sugars.  She checks her feet routinely.  Flu: 03/2020 Tetanus: 04/2016 Pneumovax: 10/2019 Covid: [place] x2 Pap smear: 09/2017 Mammogram: 03/2019 Vision screening: annually Dentist: biannually  Diet: She does eat meat. She consumes fruits and veggies. She does eat some fried foods. She drinks mostly water, tea and juice. Exercise: None  Review of Systems  Past Medical History:  Diagnosis Date   Hypertension     Current Outpatient Medications  Medication Sig Dispense Refill   amLODipine (NORVASC) 10 MG tablet Take 1 tablet (10 mg total) by mouth daily. 90 tablet 3   fluticasone (FLONASE) 50 MCG/ACT nasal spray Place 2 sprays into both nostrils daily. 16 g 1   losartan (COZAAR) 50 MG tablet TAKE 1 TABLET (50 MG TOTAL) BY MOUTH DAILY. 90 tablet 0   metFORMIN (GLUCOPHAGE) 500 MG tablet Take 1 tablet (500 mg total) by mouth 2 (two) times daily with a meal. 60 tablet 2   spironolactone (ALDACTONE) 25 MG tablet TAKE 1 TABLET BY MOUTH DAILY. 90 tablet 0   No current facility-administered medications for this visit.    No Known Allergies  Family History  Problem Relation Age of Onset   Hypertension Mother    Diabetes Maternal Aunt    Breast cancer Neg Hx     Social History   Socioeconomic History   Marital status: Single    Spouse name: Not on file   Number of children: Not on file   Years of education: Not on file   Highest education level: Not on file  Occupational History   Not on file   Tobacco Use   Smoking status: Never Smoker   Smokeless tobacco: Never Used  Substance and Sexual Activity   Alcohol use: Yes    Alcohol/week: 0.0 standard drinks    Comment: rare   Drug use: No   Sexual activity: Yes    Birth control/protection: Surgical  Other Topics Concern   Not on file  Social History Narrative   Not on file   Social Determinants of Health   Financial Resource Strain: Not on file  Food Insecurity: Not on file  Transportation Needs: Not on file  Physical Activity: Not on file  Stress: Not on file  Social Connections: Not on file  Intimate Partner Violence: Not on file     Constitutional: Denies fever, malaise, fatigue, headache or abrupt weight changes.  HEENT: Denies eye pain, eye redness, ear pain, ringing in the ears, wax buildup, runny nose, nasal congestion, bloody nose, or sore throat. Respiratory: Denies difficulty breathing, shortness of breath, cough or sputum production.   Cardiovascular: Denies chest pain, chest tightness, palpitations or swelling in the hands or feet.  Gastrointestinal: Denies abdominal pain, bloating, constipation, diarrhea or blood in the stool.  GU: Denies urgency, frequency, pain with urination, burning sensation, blood in urine, odor or discharge. Musculoskeletal: Denies decrease in range of motion, difficulty with gait, muscle pain or joint pain and swelling.  Skin: Pt  reports rash of abdomen. Denies redness,  lesions or ulcercations.  Neurological: Denies dizziness, difficulty with memory, difficulty with speech or problems with balance and coordination.  Psych: Denies anxiety, depression, SI/HI.  No other specific complaints in a complete review of systems (except as listed in HPI above).     Objective:   Physical Exam  BP (!) 142/98    Pulse 70    Temp 98.3 F (36.8 C) (Temporal)    Ht 5' 3.5" (1.613 m)    Wt 269 lb (122 kg)    SpO2 98%    BMI 46.90 kg/m   Wt Readings from Last 3 Encounters:  11/10/19  273 lb (123.8 kg)  10/25/19 273 lb (123.8 kg)  08/23/19 271 lb (122.9 kg)    General: Appears her stated age, obese, in NAD. Skin: Warm, dry and intact. Eczema noted of right abdomen. HEENT: Head: normal shape and size; Eyes: sclera white, no icterus, conjunctiva pink, PERRLA and EOMs intact;  Neck:  Neck supple, trachea midline. No masses, lumps or thyromegaly present.  Cardiovascular: Normal rate and rhythm. S1,S2 noted.  No murmur, rubs or gallops noted. No JVD or BLE edema.  Pulmonary/Chest: Normal effort and positive vesicular breath sounds. No respiratory distress. No wheezes, rales or ronchi noted.  Abdomen: Soft and nontender. Normal bowel sounds. No distention or masses noted. Liver, spleen and kidneys non palpable. Musculoskeletal: Strength 5/5 BUE/BLE. No signs of joint swelling. No difficulty with gait.  Neurological: Alert and oriented. Cranial nerves II-XII grossly intact. Coordination normal.  Psychiatric: Mood and affect normal. Behavior is normal. Judgment and thought content normal.    BMET    Component Value Date/Time   NA 136 11/10/2019 1620   K 3.7 11/10/2019 1620   CL 103 11/10/2019 1620   CO2 25 11/10/2019 1620   GLUCOSE 126 (H) 11/10/2019 1620   BUN 10 11/10/2019 1620   CREATININE 0.72 11/10/2019 1620   CALCIUM 8.9 11/10/2019 1620    Lipid Panel     Component Value Date/Time   CHOL 142 07/25/2019 0906   TRIG 248.0 (H) 07/25/2019 0906   HDL 34.70 (L) 07/25/2019 0906   CHOLHDL 4 07/25/2019 0906   VLDL 49.6 (H) 07/25/2019 0906   LDLCALC 74 12/21/2018 1457    CBC    Component Value Date/Time   WBC 4.6 12/21/2018 1457   RBC 4.41 12/21/2018 1457   HGB 11.5 (L) 12/21/2018 1457   HCT 35.6 (L) 12/21/2018 1457   PLT 262.0 12/21/2018 1457   MCV 80.6 12/21/2018 1457   MCH 27.9 01/05/2014 1353   MCHC 32.3 12/21/2018 1457   RDW 18.4 (H) 12/21/2018 1457    Hgb A1C Lab Results  Component Value Date   HGBA1C 6.6 (H) 10/25/2019            Assessment & Plan:   Preventative Health Maintenance:  Flu shot UTD Tetanus UTD Encouraged her to get her 3rd Covid vaccine Pap smear UTD We will start screening mammograms at 5 Encouraged her to consume a balanced diet and exercise regimen Advised her to see an eye doctor and dentist annually We will check CBC, c-Met, TSH, lipid, A1c and hep C today  RTC in 6 months, follow-up chronic conditions Webb Silversmith, NP This visit occurred during the SARS-CoV-2 public health emergency.  Safety protocols were in place, including screening questions prior to the visit, additional usage of staff PPE, and extensive cleaning of exam room while observing appropriate contact time as indicated for disinfecting solutions.

## 2020-08-29 ENCOUNTER — Other Ambulatory Visit: Payer: Self-pay | Admitting: Internal Medicine

## 2020-08-29 LAB — LIPID PANEL
Cholesterol: 136 mg/dL (ref 0–200)
HDL: 39.3 mg/dL (ref >39.00)
NonHDL: 97.05
Total CHOL/HDL Ratio: 3
Triglycerides: 277 mg/dL — ABNORMAL HIGH (ref 0.0–149.0)
VLDL: 55.4 mg/dL — ABNORMAL HIGH (ref 0.0–40.0)

## 2020-08-29 LAB — COMPREHENSIVE METABOLIC PANEL
ALT: 14 U/L (ref 0–35)
AST: 16 U/L (ref 0–37)
Albumin: 3.7 g/dL (ref 3.5–5.2)
Alkaline Phosphatase: 80 U/L (ref 39–117)
BUN: 9 mg/dL (ref 6–23)
CO2: 29 mEq/L (ref 19–32)
Calcium: 8.7 mg/dL (ref 8.4–10.5)
Chloride: 102 mEq/L (ref 96–112)
Creatinine, Ser: 0.71 mg/dL (ref 0.40–1.20)
GFR: 104.44 mL/min (ref >60.00)
Glucose, Bld: 79 mg/dL (ref 70–99)
Potassium: 3.5 mEq/L (ref 3.5–5.1)
Sodium: 139 mEq/L (ref 135–145)
Total Bilirubin: 0.4 mg/dL (ref 0.2–1.2)
Total Protein: 7.4 g/dL (ref 6.0–8.3)

## 2020-08-29 LAB — HEPATITIS C ANTIBODY
Hepatitis C Ab: NONREACTIVE
SIGNAL TO CUT-OFF: 0.01 (ref <1.00)

## 2020-08-29 LAB — CBC
HCT: 34.3 % — ABNORMAL LOW (ref 36.0–46.0)
Hemoglobin: 11.3 g/dL — ABNORMAL LOW (ref 12.0–15.0)
MCHC: 33 g/dL (ref 30.0–36.0)
MCV: 77.1 fl — ABNORMAL LOW (ref 78.0–100.0)
Platelets: 241 10*3/uL (ref 150.0–400.0)
RBC: 4.45 Mil/uL (ref 3.87–5.11)
RDW: 18 % — ABNORMAL HIGH (ref 11.5–15.5)
WBC: 5.7 10*3/uL (ref 4.0–10.5)

## 2020-08-29 LAB — HEMOGLOBIN A1C: Hgb A1c MFr Bld: 6.6 % — ABNORMAL HIGH (ref 4.6–6.5)

## 2020-08-29 LAB — TSH: TSH: 3.42 u[IU]/mL (ref 0.35–4.50)

## 2020-08-29 LAB — LDL CHOLESTEROL, DIRECT: Direct LDL: 62 mg/dL

## 2020-08-30 NOTE — Telephone Encounter (Signed)
Will wait for labs to be interpreted

## 2020-09-02 ENCOUNTER — Telehealth: Payer: Self-pay

## 2020-09-02 ENCOUNTER — Emergency Department
Admission: EM | Admit: 2020-09-02 | Discharge: 2020-09-02 | Disposition: A | Discharge status: Home / Self Care | Payer: 59 | Attending: Emergency Medicine | Admitting: Emergency Medicine

## 2020-09-02 ENCOUNTER — Emergency Department: Payer: 59

## 2020-09-02 ENCOUNTER — Other Ambulatory Visit: Payer: Self-pay | Admitting: Physician Assistant

## 2020-09-02 ENCOUNTER — Other Ambulatory Visit: Payer: Self-pay

## 2020-09-02 DIAGNOSIS — I10 Essential (primary) hypertension: Secondary | ICD-10-CM | POA: Diagnosis not present

## 2020-09-02 DIAGNOSIS — R7303 Prediabetes: Secondary | ICD-10-CM | POA: Diagnosis not present

## 2020-09-02 DIAGNOSIS — J189 Pneumonia, unspecified organism: Secondary | ICD-10-CM | POA: Diagnosis not present

## 2020-09-02 DIAGNOSIS — J029 Acute pharyngitis, unspecified: Secondary | ICD-10-CM | POA: Diagnosis not present

## 2020-09-02 DIAGNOSIS — Z20822 Contact with and (suspected) exposure to covid-19: Secondary | ICD-10-CM | POA: Diagnosis not present

## 2020-09-02 DIAGNOSIS — Z79899 Other long term (current) drug therapy: Secondary | ICD-10-CM | POA: Diagnosis not present

## 2020-09-02 DIAGNOSIS — R519 Headache, unspecified: Secondary | ICD-10-CM | POA: Diagnosis not present

## 2020-09-02 DIAGNOSIS — R059 Cough, unspecified: Secondary | ICD-10-CM | POA: Diagnosis not present

## 2020-09-02 DIAGNOSIS — Z7984 Long term (current) use of oral hypoglycemic drugs: Secondary | ICD-10-CM | POA: Diagnosis not present

## 2020-09-02 LAB — RESP PANEL BY RT-PCR (FLU A&B, COVID) ARPGX2
Influenza A by PCR: NEGATIVE
Influenza B by PCR: NEGATIVE
SARS Coronavirus 2 by RT PCR: NEGATIVE

## 2020-09-02 MED ORDER — AMOXICILLIN-POT CLAVULANATE 875-125 MG PO TABS
1.0000 | ORAL_TABLET | Freq: Once | ORAL | Status: AC
  Administered 2020-09-02: 1 via ORAL
  Filled 2020-09-02: qty 1

## 2020-09-02 MED ORDER — AZITHROMYCIN 250 MG PO TABS: 250.0000 mg | ORAL_TABLET | Freq: Every day | ORAL | 0 refills | Status: AC

## 2020-09-02 MED ORDER — AMOXICILLIN-POT CLAVULANATE 875-125 MG PO TABS: 1.0000 | ORAL_TABLET | Freq: Two times a day (BID) | ORAL | 0 refills | Status: DC

## 2020-09-02 MED ORDER — BENZONATATE 100 MG PO CAPS: ORAL_CAPSULE | ORAL | 0 refills | Status: DC

## 2020-09-02 MED ORDER — AZITHROMYCIN 500 MG PO TABS
500.0000 mg | ORAL_TABLET | Freq: Once | ORAL | Status: AC
  Administered 2020-09-02: 500 mg via ORAL
  Filled 2020-09-02: qty 1

## 2020-09-02 MED ORDER — ALBUTEROL SULFATE HFA 108 (90 BASE) MCG/ACT IN AERS: 2.0000 | INHALATION_SPRAY | Freq: Four times a day (QID) | RESPIRATORY_TRACT | 0 refills | Status: DC | PRN

## 2020-09-02 NOTE — Telephone Encounter (Signed)
Pt said on 08/30/20 started with prod cough with yellow phlegm, head congested; on 09/01/20 started with fever; now pt has prod cough with clear phlegm that is blood tinged. Head congestion, very congested sounding cough, throat feels like swelling and slight difficulty swallowing. Pt said has difficulty breathing when coughs; pt was seen at [facility] in [place] over weekend and pt said UC recommended pt to get a covid test which pt has not done yet. Advised pt needs go to ED; pt denied 911 and said her mom would take her to [facility] ED now. 911 and ED precautions given again and pt said she is in no distress with breathing now and her mom will take her to ED. Sending note to Pamala Hurry NP.

## 2020-09-02 NOTE — Telephone Encounter (Signed)
Agree with advice given

## 2020-09-02 NOTE — ED Notes (Signed)
See triage note  Presents with h/a cough and sore throat  States she started with sx's on Friday  Denies any fever

## 2020-09-02 NOTE — Discharge Instructions (Signed)
Take the 2 antibiotics as directed.  Continue to hydrate to prevent dehydration.  Take over-the-counter cough medicine like Delsym or Robitussin as needed.  Follow-up with your primary provider for repeat chest x-ray in 4 weeks.  Return to the ED if needed.

## 2020-09-02 NOTE — ED Provider Notes (Signed)
[facility] Emergency Department Provider Note ____________________________________________  Time seen: 1055  I have reviewed the triage vital signs and the nursing notes.  HISTORY  Chief Complaint  URI  HPI Kathy [name] is a 45 y.o. female with the below noted medical history, presents to the ED for sudden onset of persistent cough for 24 hours.  Patient reports poor sleep last night secondary to persistent dry, nonproductive cough patient reports pain to the bilateral lower rib cage and chest wall.  She denies any fever, chills, sweats, nausea, vomiting, or diarrhea.  She also denies any sick contacts or recent trauma, or other exposures.   Patient is very previously vaccinated against COVID and influenza.  Past Medical History:  Diagnosis Date  . Hypertension     Patient Active Problem List   Diagnosis Date Noted  . 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 HTN (hypertension) 01/05/2014    Past Surgical History:  Procedure Laterality Date  . WISDOM TOOTH EXTRACTION      Prior to Admission medications   Medication Sig Start Date End Date Taking? Authorizing Provider  albuterol (VENTOLIN HFA) 108 (90 Base) MCG/ACT inhaler Inhale 2 puffs into the lungs every 6 (six) hours as needed for shortness of breath. 09/02/20  Yes Menshew, Charlesetta Ivory, PA-C  amoxicillin-clavulanate (AUGMENTIN) 875-125 MG tablet Take 1 tablet by mouth 2 (two) times daily. 09/02/20  Yes Menshew, Charlesetta Ivory, PA-C  azithromycin (ZITHROMAX Z-PAK) 250 MG tablet Take 1 tablet (250 mg total) by mouth daily for 4 days. 09/03/20 09/07/20 Yes Menshew, Charlesetta Ivory, PA-C  benzonatate (TESSALON PERLES) 100 MG capsule Take 1-2 tabs TID prn cough 09/02/20  Yes Menshew, Charlesetta Ivory, PA-C  amLODipine (NORVASC) 10 MG tablet Take 1 tablet (10 mg total) by mouth daily. 08/23/19   Lorre Munroe, NP  fluticasone (FLONASE) 50 MCG/ACT nasal spray Place 2 sprays into both nostrils daily.  05/04/18   Lorre Munroe, NP  losartan (COZAAR) 50 MG tablet TAKE 1 TABLET (50 MG TOTAL) BY MOUTH DAILY. 01/19/20   Lorre Munroe, NP  metFORMIN (GLUCOPHAGE) 500 MG tablet Take 1 tablet (500 mg total) by mouth 2 (two) times daily with a meal. 08/09/19   Baity, Salvadore Oxford, NP  spironolactone (ALDACTONE) 25 MG tablet Take 1 tablet (25 mg total) by mouth daily. 08/28/20   Lorre Munroe, NP    Allergies Patient has no known allergies.  Family History  Problem Relation Age of Onset  . Hypertension Mother   . Diabetes Maternal Aunt   . Breast cancer Neg Hx     Social History Social History   Tobacco Use  . Smoking status: Never Smoker  . Smokeless tobacco: Never Used  Substance Use Topics  . Alcohol use: Yes    Alcohol/week: 0.0 standard drinks    Comment: rare  . Drug use: No    Review of Systems  Constitutional: Negative for fever. Eyes: Negative for visual changes. ENT: Negative for sore throat. Cardiovascular: Negative for chest pain. Respiratory: Negative for shortness of breath. Reports cough and congestion.  Gastrointestinal: Negative for abdominal pain, vomiting and diarrhea. Genitourinary: Negative for dysuria. Musculoskeletal: Negative for back pain. Skin: Negative for rash. Neurological: Positive for headaches. Denies focal weakness or numbness. ____________________________________________  PHYSICAL EXAM:  VITAL SIGNS: ED Triage Vitals  Enc Vitals Group     BP 09/02/20 1027 (!) 168/98     Pulse Rate 09/02/20 1027 84     Resp 09/02/20  1027 20     Temp 09/02/20 1027 98.7 F (37.1 C)     Temp Source 09/02/20 1027 Oral     SpO2 09/02/20 1027 98 %     Weight 09/02/20 1028 198 lb (89.8 kg)     Height 09/02/20 1028 5\' 4"  (1.626 m)     Head Circumference --      Peak Flow --      Pain Score 09/02/20 1024 5     Pain Loc --      Pain Edu? --      Excl. in GC? --     Constitutional: Alert and oriented. Well appearing and in no distress. Head: Normocephalic  and atraumatic. Eyes: Conjunctivae are normal. PERRL. Normal extraocular movements Ears: Canals clear. TMs intact bilaterally. Nose: No congestion/rhinorrhea/epistaxis. Mouth/Throat: Mucous membranes are moist. Neck: Supple. No thyromegaly. Hematological/Lymphatic/Immunological: No cervical lymphadenopathy. Cardiovascular: Normal rate, regular rhythm. Normal distal pulses. Respiratory: Normal respiratory effort. Intermittent cough noted.  Mild bilateral rhonchi noted. No wheezes/rales.. Gastrointestinal: Soft and nontender. No distention. Musculoskeletal: Nontender with normal range of motion in all extremities.  Neurologic:  Normal gait without ataxia. Normal speech and language. No gross focal neurologic deficits are appreciated. ____________________________________________   LABS (pertinent positives/negatives) Labs Reviewed  RESP PANEL BY RT-PCR (FLU A&B, COVID) [id number]  ____________________________________________   RADIOLOGY  CXR   IMPRESSION: Patchy bibasilar airspace opacities suspicious for multilobar pneumonia. Followup PA and lateral chest X-ray is recommended in 3-4 weeks following trial of antibiotic therapy to ensure resolution and exclude underlying malignancy.  I, 09/04/20, personally viewed and evaluated these images (plain radiographs) as part of my medical decision making, as well as reviewing the written report by the radiologist. ____________________________________________  PROCEDURES  Azithromycin 500 mg PO Augmentin 875 mg PO  Procedures ____________________________________________  INITIAL IMPRESSION / ASSESSMENT AND PLAN / ED COURSE  DDX: CAP, influenza, Covid, bronchitis  Patient ED evaluation of sudden bilateral chest wall pain and persistent cough.  She denies any interim fevers, shortness of breath, or central chest pain.  Exam is overall benign reassuring that she shows no signs of acute respiratory distress or dehydration.   Patient was found on a chest x-ray reviewed by me, to have bilateral opacities concerning for pneumonia.  Viral screen was negative at this time.  Patient started empirically on a course of amoxicillin and azithromycin for community-acquired pneumonia.  She will be discharged with the same as well as Tessalon Perles and albuterol inhaler.  She is encouraged to follow-up with primary provider or return to the ED if needed that she should have a repeat chest x-ray with her PCP in outpatient basis to confirm resolution.  Patient is aware.   MEYER DOCKERY was evaluated in Emergency Department on 09/02/2020 for the symptoms described in the history of present illness. She was evaluated in the context of the global COVID-19 pandemic, which necessitated consideration that the patient might be at risk for infection with the SARS-CoV-2 virus that causes COVID-19. Institutional protocols and algorithms that pertain to the evaluation of patients at risk for COVID-19 are in a state of rapid change based on information released by regulatory bodies including the CDC and federal and state organizations. These policies and algorithms were followed during the patient's care in the ED. ____________________________________________  FINAL CLINICAL IMPRESSION(S) / ED DIAGNOSES  Final diagnoses:  Community acquired pneumonia, unspecified laterality      09/04/2020, Karmen Stabs, PA-C 09/02/20 1251    09/04/20, MD  09/02/20 1537  

## 2020-09-02 NOTE — ED Triage Notes (Signed)
Pt c/o sore throat with HA, cough, congestion since Friday.

## 2020-09-04 ENCOUNTER — Other Ambulatory Visit: Payer: Self-pay | Admitting: Internal Medicine

## 2020-09-11 ENCOUNTER — Ambulatory Visit: Payer: 59 | Admitting: Internal Medicine

## 2020-09-17 ENCOUNTER — Other Ambulatory Visit: Payer: Self-pay

## 2020-09-17 ENCOUNTER — Encounter: Payer: Self-pay | Admitting: Internal Medicine

## 2020-09-17 ENCOUNTER — Ambulatory Visit: Payer: 59 | Admitting: Internal Medicine

## 2020-09-17 VITALS — BP 134/86 | HR 67 | Temp 97.8°F | Wt 268.0 lb

## 2020-09-17 DIAGNOSIS — I10 Essential (primary) hypertension: Secondary | ICD-10-CM | POA: Diagnosis not present

## 2020-09-17 DIAGNOSIS — R918 Other nonspecific abnormal finding of lung field: Secondary | ICD-10-CM

## 2020-09-17 LAB — BASIC METABOLIC PANEL
BUN: 9 mg/dL (ref 6–23)
CO2: 27 mEq/L (ref 19–32)
Calcium: 8.8 mg/dL (ref 8.4–10.5)
Chloride: 102 mEq/L (ref 96–112)
Creatinine, Ser: 0.69 mg/dL (ref 0.40–1.20)
GFR: 106.57 mL/min (ref >60.00)
Glucose, Bld: 96 mg/dL (ref 70–99)
Potassium: 4.4 mEq/L (ref 3.5–5.1)
Sodium: 135 mEq/L (ref 135–145)

## 2020-09-17 MED ORDER — SIMVASTATIN 10 MG PO TABS
10.0000 mg | ORAL_TABLET | Freq: Every day | ORAL | 0 refills | Status: DC
  Filled 2020-09-17: qty 90, 90d supply, fill #0

## 2020-09-17 MED ORDER — FLUTICASONE PROPIONATE 50 MCG/ACT NA SUSP
2.0000 | Freq: Every day | NASAL | 0 refills | Status: DC
  Filled 2020-09-17: qty 48, 90d supply, fill #0

## 2020-09-17 NOTE — Patient Instructions (Signed)

## 2020-09-17 NOTE — Progress Notes (Signed)
Subjective:    Patient ID: Emeline General, female    DOB: 10-Jan-1976, 45 y.o.   MRN: [id number]  HPI  Patient presents the clinic today for follow-up of hypertension.  At her last visit, she had only been taking Amlodipine and Losartan and not her Spironolactone.  She was advised to pick this up and get started on this as soon as possible.  She has been taking her medications as prescribed.  Her BP today is 134/86.  She had a c-Met drawn 3/23, normal creatinine, GFR and potassium.  ECG from 01/2015 reviewed.  She is also due for ER follow-up.  She presented to the ER 3/28 with complaint of cough.  Chest x-ray was concerning for multilobar pneumonia.  She was treated with Azithromycin, Augmentin, Tessalon Perles and Albuterol.  She was discharged and advised to follow-up with her PCP.  Since discharge, she has finished her course of antibiotics. She denies cough or SOB and reports complete resolution of symptoms.  Review of Systems  Past Medical History:  Diagnosis Date  . Hypertension     Current Outpatient Medications  Medication Sig Dispense Refill  . albuterol (VENTOLIN HFA) 108 (90 Base) MCG/ACT inhaler Inhale 2 puffs into the lungs every 6 (six) hours as needed for shortness of breath. 6.7 g 0  . amLODipine (NORVASC) 10 MG tablet TAKE 1 TABLET (10 MG TOTAL) BY MOUTH DAILY. 90 tablet 3  . amoxicillin-clavulanate (AUGMENTIN) 875-125 MG tablet Take 1 tablet by mouth 2 (two) times daily. 20 tablet 0  . benzonatate (TESSALON PERLES) 100 MG capsule Take 1-2 tabs TID prn cough 30 capsule 0  . fluticasone (FLONASE) 50 MCG/ACT nasal spray Place 2 sprays into both nostrils daily. 16 g 1  . losartan (COZAAR) 50 MG tablet TAKE 1 TABLET BY MOUTH DAILY. 90 tablet 0  . metFORMIN (GLUCOPHAGE) 500 MG tablet TAKE 1 TABLET BY MOUTH TWICE DAILY WITH A MEAL. 60 tablet 2  . spironolactone (ALDACTONE) 25 MG tablet Take 1 tablet (25 mg total) by mouth daily. 90 tablet 0   No current  facility-administered medications for this visit.    No Known Allergies  Family History  Problem Relation Age of Onset  . Hypertension Mother   . Diabetes Maternal Aunt   . Breast cancer Neg Hx     Social History   Socioeconomic History  . Marital status: Single    Spouse name: Not on file  . Number of children: Not on file  . Years of education: Not on file  . Highest education level: Not on file  Occupational History  . Not on file  Tobacco Use  . Smoking status: Never Smoker  . Smokeless tobacco: Never Used  Substance and Sexual Activity  . Alcohol use: Yes    Alcohol/week: 0.0 standard drinks    Comment: rare  . Drug use: No  . Sexual activity: Yes    Birth control/protection: Surgical  Other Topics Concern  . Not on file  Social History Narrative  . Not on file   Social Determinants of Health   Financial Resource Strain: Not on file  Food Insecurity: Not on file  Transportation Needs: Not on file  Physical Activity: Not on file  Stress: Not on file  Social Connections: Not on file  Intimate Partner Violence: Not on file     Constitutional: Denies fever, malaise, fatigue, headache or abrupt weight changes.  Respiratory: Denies difficulty breathing, shortness of breath, cough or sputum production.   Cardiovascular:  Denies chest pain, chest tightness, palpitations or swelling in the hands or feet.  Neurological: Denies dizziness, difficulty with memory, difficulty with speech or problems with balance and coordination.    No other specific complaints in a complete review of systems (except as listed in HPI above).     Objective:   Physical Exam   BP 134/86   Pulse 67   Temp 97.8 F (36.6 C) (Temporal)   Wt 268 lb (121.6 kg)   SpO2 98%   BMI 46.00 kg/m   Wt Readings from Last 3 Encounters:  09/02/20 198 lb (89.8 kg)  08/28/20 269 lb (122 kg)  11/10/19 273 lb (123.8 kg)    General: Appears her stated age, obese, in NAD. Neck:  Neck supple,  trachea midline. No masses, lumpspresent.  Cardiovascular: Normal rate and rhythm. S1,S2 noted.  No murmur, rubs or gallops noted. Trace pitting BLE, R>L.  Pulmonary/Chest: Normal effort and positive vesicular breath sounds. No respiratory distress. No wheezes, rales or ronchi noted.  Neurological: Alert and oriented.   BMET    Component Value Date/Time   NA 139 08/28/2020 1507   K 3.5 08/28/2020 1507   CL 102 08/28/2020 1507   CO2 29 08/28/2020 1507   GLUCOSE 79 08/28/2020 1507   BUN 9 08/28/2020 1507   CREATININE 0.71 08/28/2020 1507   CREATININE 0.72 11/10/2019 1620   CALCIUM 8.7 08/28/2020 1507    Lipid Panel     Component Value Date/Time   CHOL 136 08/28/2020 1507   TRIG 277.0 (H) 08/28/2020 1507   HDL 39.30 08/28/2020 1507   CHOLHDL 3 08/28/2020 1507   VLDL 55.4 (H) 08/28/2020 1507   LDLCALC 74 12/21/2018 1457    CBC    Component Value Date/Time   WBC 5.7 08/28/2020 1507   RBC 4.45 08/28/2020 1507   HGB 11.3 (L) 08/28/2020 1507   HCT 34.3 (L) 08/28/2020 1507   PLT 241.0 08/28/2020 1507   MCV 77.1 (L) 08/28/2020 1507   MCH 27.9 01/05/2014 1353   MCHC 33.0 08/28/2020 1507   RDW 18.0 (H) 08/28/2020 1507    Hgb A1C Lab Results  Component Value Date   HGBA1C 6.6 (H) 08/28/2020           Assessment & Plan:   ER follow-up for Community-Acquired Pneumonia:  ER notes, labs and imaging reviewed She has completed her course of antibiotics No further intervention needed at this time Will not plan to repeat chest xray unless symptoms reoccur  Return precautions discussed Webb Silversmith, NP  This visit occurred during the SARS-CoV-2 public health emergency.  Safety protocols were in place, including screening questions prior to the visit, additional usage of staff PPE, and extensive cleaning of exam room while observing appropriate contact time as indicated for disinfecting solutions.

## 2020-09-17 NOTE — Assessment & Plan Note (Signed)
Better control on Losartan, Amlodipine and Spironolactone Reinforced DASH diet and exercise for weight loss Will monitor

## 2020-09-18 ENCOUNTER — Other Ambulatory Visit: Payer: Self-pay

## 2020-11-27 ENCOUNTER — Other Ambulatory Visit: Payer: Self-pay

## 2020-11-27 ENCOUNTER — Ambulatory Visit (INDEPENDENT_AMBULATORY_CARE_PROVIDER_SITE_OTHER): Payer: 59 | Admitting: Internal Medicine

## 2020-11-27 ENCOUNTER — Encounter: Payer: Self-pay | Admitting: Internal Medicine

## 2020-11-27 VITALS — BP 116/86 | HR 70 | Temp 98.2°F | Resp 17 | Ht 64.0 in | Wt 266.8 lb

## 2020-11-27 DIAGNOSIS — E1165 Type 2 diabetes mellitus with hyperglycemia: Secondary | ICD-10-CM | POA: Diagnosis not present

## 2020-11-27 DIAGNOSIS — E1169 Type 2 diabetes mellitus with other specified complication: Secondary | ICD-10-CM | POA: Insufficient documentation

## 2020-11-27 DIAGNOSIS — E785 Hyperlipidemia, unspecified: Secondary | ICD-10-CM | POA: Insufficient documentation

## 2020-11-27 DIAGNOSIS — E66812 Obesity, class 2: Secondary | ICD-10-CM | POA: Insufficient documentation

## 2020-11-27 DIAGNOSIS — E782 Mixed hyperlipidemia: Secondary | ICD-10-CM | POA: Diagnosis not present

## 2020-11-27 DIAGNOSIS — I10 Essential (primary) hypertension: Secondary | ICD-10-CM | POA: Diagnosis not present

## 2020-11-27 NOTE — Assessment & Plan Note (Signed)
A1c today No urine microalbumin secondary to ARB therapy Encouraged her to consume a low-carb diet and exercise weight loss Continue metformin Consider Victoza for blood sugar control and weight loss pending labs Encouraged her to make an appointment for her eye exam Encourage routine foot exams Encouraged her to get a flu shot in the fall Encouraged her to get her COVID booster Pneumovax UTD

## 2020-11-27 NOTE — Patient Instructions (Signed)
https://www.diabeteseducator.org/docs/default-source/living-with-diabetes/conquering-the-grocery-store-v1.pdf?sfvrsn=4">  Carbohydrate Counting for Diabetes Mellitus, Adult Carbohydrate counting is a method of keeping track of how many carbohydrates you eat. Eating carbohydrates naturally increases the amount of sugar (glucose) in the blood. Counting how many carbohydrates you eat improves your bloodglucose control, which helps you manage your diabetes. It is important to know how many carbohydrates you can safely have in each meal. This is different for every person. A dietitian can help you make a meal plan and calculate how many carbohydrates you should have at each meal andsnack. What foods contain carbohydrates? Carbohydrates are found in the following foods: Grains, such as breads and cereals. Dried beans and soy products. Starchy vegetables, such as potatoes, peas, and corn. Fruit and fruit juices. Milk and yogurt. Sweets and snack foods, such as cake, cookies, candy, chips, and soft drinks. How do I count carbohydrates in foods? There are two ways to count carbohydrates in food. You can read food labels or learn standard serving sizes of foods. You can use either of the methods or acombination of both. Using the Nutrition Facts label The Nutrition Facts list is included on the labels of almost all packaged foods and beverages in the [place] It includes: The serving size. Information about nutrients in each serving, including the grams (g) of carbohydrate per serving. To use the Nutrition Facts: Decide how many servings you will have. Multiply the number of servings by the number of carbohydrates per serving. The resulting number is the total amount of carbohydrates that you will be having. Learning the standard serving sizes of foods When you eat carbohydrate foods that are not packaged or do not include Nutrition Facts on the label, you need to measure the servings in order to count the  amount of carbohydrates. Measure the foods that you will eat with a food scale or measuring cup, if needed. Decide how many standard-size servings you will eat. Multiply the number of servings by 15. For foods that contain carbohydrates, one serving equals 15 g of carbohydrates. For example, if you eat 2 cups or 10 oz (300 g) of strawberries, you will have eaten 2 servings and 30 g of carbohydrates (2 servings x 15 g = 30 g). For foods that have more than one food mixed, such as soups and casseroles, you must count the carbohydrates in each food that is included. The following list contains standard serving sizes of common carbohydrate-rich foods. Each of these servings has about 15 g of carbohydrates: 1 slice of bread. 1 six-inch (15 cm) tortilla. ? cup or 2 oz (53 g) cooked rice or pasta.  cup or 3 oz (85 g) cooked or canned, drained and rinsed beans or lentils.  cup or 3 oz (85 g) starchy vegetable, such as peas, corn, or squash.  cup or 4 oz (120 g) hot cereal.  cup or 3 oz (85 g) boiled or mashed potatoes, or  or 3 oz (85 g) of a large baked potato.  cup or 4 fl oz (118 mL) fruit juice. 1 cup or 8 fl oz (237 mL) milk. 1 small or 4 oz (106 g) apple.  or 2 oz (63 g) of a medium banana. 1 cup or 5 oz (150 g) strawberries. 3 cups or 1 oz (24 g) popped popcorn. What is an example of carbohydrate counting? To calculate the number of carbohydrates in this sample meal, follow the stepsshown below. Sample meal 3 oz (85 g) chicken breast. ? cup or 4 oz (106 g) brown rice.    cup or 3 oz (85 g) corn. 1 cup or 8 fl oz (237 mL) milk. 1 cup or 5 oz (150 g) strawberries with sugar-free whipped topping. Carbohydrate calculation Identify the foods that contain carbohydrates: Rice. Corn. Milk. Strawberries. Calculate how many servings you have of each food: 2 servings rice. 1 serving corn. 1 serving milk. 1 serving strawberries. Multiply each number of servings by 15 g: 2 servings  rice x 15 g = 30 g. 1 serving corn x 15 g = 15 g. 1 serving milk x 15 g = 15 g. 1 serving strawberries x 15 g = 15 g. Add together all of the amounts to find the total grams of carbohydrates eaten: 30 g + 15 g + 15 g + 15 g = 75 g of carbohydrates total. What are tips for following this plan? Shopping Develop a meal plan and then make a shopping list. Buy fresh and frozen vegetables, fresh and frozen fruit, dairy, eggs, beans, lentils, and whole grains. Look at food labels. Choose foods that have more fiber and less sugar. Avoid processed foods and foods with added sugars. Meal planning Aim to have the same amount of carbohydrates at each meal and for each snack time. Plan to have regular, balanced meals and snacks. Where to find more information American Diabetes Association: www.diabetes.org Centers for Disease Control and Prevention: www.cdc.gov Summary Carbohydrate counting is a method of keeping track of how many carbohydrates you eat. Eating carbohydrates naturally increases the amount of sugar (glucose) in the blood. Counting how many carbohydrates you eat improves your blood glucose control, which helps you manage your diabetes. A dietitian can help you make a meal plan and calculate how many carbohydrates you should have at each meal and snack. This information is not intended to replace advice given to you by your health care provider. Make sure you discuss any questions you have with your healthcare provider. Document Revised: 05/25/2019 Document Reviewed: 05/26/2019 Elsevier Patient Education  2021 Elsevier Inc.  

## 2020-11-27 NOTE — Assessment & Plan Note (Signed)
Encourage low-carb diet and exercise for weight loss Consider Victoza for weight management

## 2020-11-27 NOTE — Assessment & Plan Note (Signed)
Encouraged her to consume a low fat diet and exercise weight loss C-Met and lipid profile today Continue simvastatin, will adjust if needed based on labs

## 2020-11-27 NOTE — Progress Notes (Signed)
Subjective:    Patient ID: Kathy [name], female    DOB: 13-Sep-1975, 45 y.o.   MRN: [id number]  HPI  Patient presents the clinic today for follow-up of chronic conditions.  HTN: Her BP today is 116/86.  She is taking Amlodipine, Losartan and Spironolactone as prescribed.  ECG from 01/2015 reviewed.  DM2: Her last A1c was 6.6%, 08/2020.  She is taking Metformin as prescribed.  She does not check her sugars.  Her last eye exam was more than 1 year ago.  She checks her feet routinely.  Flu 03/2019.  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  Transport planner.  HLD: Her last LDL was 62, triglycerides 694, 08/2020.  She denies myalgias on Simvastatin.  She has been trying to consume a low-fat diet.  Review of Systems     Past Medical History:  Diagnosis Date   Hypertension     Current Outpatient Medications  Medication Sig Dispense Refill   albuterol (VENTOLIN HFA) 108 (90 Base) MCG/ACT inhaler Inhale 2 puffs into the lungs every 6 (six) hours as needed for shortness of breath. 6.7 g 0   albuterol (VENTOLIN HFA) 108 (90 Base) MCG/ACT inhaler INHALE 2 PUFFS BY MOUTH EVERY 6 HOURS AS NEEDED FOR SHORTNESS OF BREATH. 18 g 0   amLODipine (NORVASC) 10 MG tablet TAKE 1 TABLET (10 MG TOTAL) BY MOUTH DAILY. 90 tablet 3   amLODipine (NORVASC) 10 MG tablet TAKE 1 TABLET (10 MG TOTAL) BY MOUTH DAILY. 90 tablet 3   amoxicillin-clavulanate (AUGMENTIN) 875-125 MG tablet Take 1 tablet by mouth 2 (two) times daily. 20 tablet 0   amoxicillin-clavulanate (AUGMENTIN) 875-125 MG tablet TAKE 1 TABLET BY MOUTH 2 (TWO) TIMES DAILY. 20 tablet 0   azithromycin (ZITHROMAX) 250 MG tablet TAKE 1 TABLET BY MOUTH DAILY FOR 4 DAYS. 4 tablet 0   benzonatate (TESSALON PERLES) 100 MG capsule Take 1-2 tabs TID prn cough 30 capsule 0   benzonatate (TESSALON) 100 MG capsule TAKE 1 OR 2 CAPSULES BY MOUTH 3 TIMES A DAY AS NEEDED FOR COUGHT 30 capsule 0   fluticasone (FLONASE) 50 MCG/ACT nasal spray Place 2 sprays into both nostrils daily. 48 g 0    losartan (COZAAR) 50 MG tablet TAKE 1 TABLET BY MOUTH DAILY. 90 tablet 0   losartan (COZAAR) 50 MG tablet TAKE 1 TABLET BY MOUTH DAILY. 90 tablet 0   metFORMIN (GLUCOPHAGE) 500 MG tablet TAKE 1 TABLET BY MOUTH TWICE DAILY WITH A MEAL. 60 tablet 2   metFORMIN (GLUCOPHAGE) 500 MG tablet TAKE 1 TABLET BY MOUTH TWICE DAILY WITH A MEAL. 60 tablet 2   simvastatin (ZOCOR) 10 MG tablet Take 1 tablet (10 mg total) by mouth at bedtime. 90 tablet 0   spironolactone (ALDACTONE) 25 MG tablet Take 1 tablet (25 mg total) by mouth daily. 90 tablet 0   spironolactone (ALDACTONE) 25 MG tablet TAKE 1 TABLET (25 MG TOTAL) BY MOUTH DAILY. 90 tablet 0   No current facility-administered medications for this visit.    No Known Allergies  Family History  Problem Relation Age of Onset   Hypertension Mother    Diabetes Maternal Aunt    Breast cancer Neg Hx     Social History   Socioeconomic History   Marital status: Single    Spouse name: Not on file   Number of children: Not on file   Years of education: Not on file   Highest education level: Not on file  Occupational History   Not on file  Tobacco Use   Smoking status: Never   Smokeless tobacco: Never  Substance and Sexual Activity   Alcohol use: Yes    Alcohol/week: 0.0 standard drinks    Comment: rare   Drug use: No   Sexual activity: Yes    Birth control/protection: Surgical  Other Topics Concern   Not on file  Social History Narrative   Not on file   Social Determinants of Health   Financial Resource Strain: Not on file  Food Insecurity: Not on file  Transportation Needs: Not on file  Physical Activity: Not on file  Stress: Not on file  Social Connections: Not on file  Intimate Partner Violence: Not on file     Constitutional: Denies fever, malaise, fatigue, headache or abrupt weight changes.  HEENT: Denies eye pain, eye redness, ear pain, ringing in the ears, wax buildup, runny nose, nasal congestion, bloody nose, or sore  throat. Respiratory: Denies difficulty breathing, shortness of breath, cough or sputum production.   Cardiovascular: Denies chest pain, chest tightness, palpitations or swelling in the hands or feet.  Gastrointestinal: Denies abdominal pain, bloating, constipation, diarrhea or blood in the stool.  GU: Denies urgency, frequency, pain with urination, burning sensation, blood in urine, odor or discharge. Musculoskeletal: Denies decrease in range of motion, difficulty with gait, muscle pain or joint pain and swelling.  Skin: Denies redness, rashes, lesions or ulcercations.  Neurological: Denies dizziness, difficulty with memory, difficulty with speech or problems with balance and coordination.  Psych: Denies anxiety, depression, SI/HI.  No other specific complaints in a complete review of systems (except as listed in HPI above).  Objective:   Physical Exam  BP 116/86 (BP Location: Right Arm, Patient Position: Sitting, Cuff Size: Large)   Pulse 70   Temp 98.2 F (36.8 C) (Temporal)   Resp 17   Ht 5\' 4"  (1.626 m)   Wt 266 lb 12.8 oz (121 kg)   LMP 10/06/2020   SpO2 100%   BMI 45.80 kg/m   Wt Readings from Last 3 Encounters:  09/17/20 268 lb (121.6 kg)  09/02/20 198 lb (89.8 kg)  08/28/20 269 lb (122 kg)    [name]: Appears her stated age, obese in NAD. Skin: Warm, dry and intact. No ulcerations noted. HEENT: Head: normal shape and size; Eyes: sclera white and EOMs intact;  Cardiovascular: Normal rate and rhythm. S1,S2 noted.  No murmur, rubs or gallops noted. No JVD or BLE edema.  Pulmonary/Chest: Normal effort and positive vesicular breath sounds. No respiratory distress. No wheezes, rales or ronchi noted.  Musculoskeletal: No difficulty with gait.  Neurological: Alert and oriented.    BMET    Component Value Date/Time   NA 135 09/17/2020 0917   K 4.4 09/17/2020 0917   CL 102 09/17/2020 0917   CO2 27 09/17/2020 0917   GLUCOSE 96 09/17/2020 0917   BUN 9 09/17/2020 0917    CREATININE 0.69 09/17/2020 0917   CREATININE 0.72 11/10/2019 1620   CALCIUM 8.8 09/17/2020 0917    Lipid Panel     Component Value Date/Time   CHOL 136 08/28/2020 1507   TRIG 277.0 (H) 08/28/2020 1507   HDL 39.30 08/28/2020 1507   CHOLHDL 3 08/28/2020 1507   VLDL 55.4 (H) 08/28/2020 1507   LDLCALC 74 12/21/2018 1457    CBC    Component Value Date/Time   WBC 5.7 08/28/2020 1507   RBC 4.45 08/28/2020 1507   HGB 11.3 (L) 08/28/2020 1507   HCT 34.3 (L) 08/28/2020 1507  PLT 241.0 08/28/2020 1507   MCV 77.1 (L) 08/28/2020 1507   MCH 27.9 01/05/2014 1353   MCHC 33.0 08/28/2020 1507   RDW 18.0 (H) 08/28/2020 1507    Hgb A1C Lab Results  Component Value Date   HGBA1C 6.6 (H) 08/28/2020           Assessment & Plan:     Nicki Reaper, NP This visit occurred during the SARS-CoV-2 public health emergency.  Safety protocols were in place, including screening questions prior to the visit, additional usage of staff PPE, and extensive cleaning of exam room while observing appropriate contact time as indicated for disinfecting solutions.

## 2020-11-27 NOTE — Assessment & Plan Note (Signed)
Controlled on amlodipine, losartan HCTZ Reinforced DASH diet and exercise weight loss C-Met today

## 2020-11-28 ENCOUNTER — Other Ambulatory Visit: Payer: Self-pay

## 2020-12-04 ENCOUNTER — Other Ambulatory Visit: Payer: Self-pay

## 2020-12-04 ENCOUNTER — Other Ambulatory Visit: Payer: 59

## 2020-12-04 DIAGNOSIS — E1165 Type 2 diabetes mellitus with hyperglycemia: Secondary | ICD-10-CM

## 2020-12-05 LAB — COMPLETE METABOLIC PANEL WITH GFR
AG Ratio: 0.9 (calc) — ABNORMAL LOW (ref 1.0–2.5)
ALT: 16 U/L (ref 6–29)
AST: 17 U/L (ref 10–30)
Albumin: 3.4 g/dL — ABNORMAL LOW (ref 3.6–5.1)
Alkaline phosphatase (APISO): 77 U/L (ref 31–125)
BUN: 9 mg/dL (ref 7–25)
CO2: 28 mmol/L (ref 20–32)
Calcium: 8.6 mg/dL (ref 8.6–10.2)
Chloride: 104 mmol/L (ref 98–110)
Creat: 0.67 mg/dL (ref 0.50–1.10)
GFR, Est African American: 125 mL/min/{1.73_m2} (ref >=60)
GFR, Est Non African American: 108 mL/min/{1.73_m2} (ref >=60)
Globulin: 3.7 g/dL (calc) (ref 1.9–3.7)
Glucose, Bld: 120 mg/dL — ABNORMAL HIGH (ref 65–99)
Potassium: 3.5 mmol/L (ref 3.5–5.3)
Sodium: 138 mmol/L (ref 135–146)
Total Bilirubin: 0.4 mg/dL (ref 0.2–1.2)
Total Protein: 7.1 g/dL (ref 6.1–8.1)

## 2020-12-05 LAB — LIPID PANEL
Cholesterol: 119 mg/dL (ref <200)
HDL: 36 mg/dL — ABNORMAL LOW (ref >=50)
LDL Cholesterol (Calc): 67 mg/dL (calc)
Non-HDL Cholesterol (Calc): 83 mg/dL (calc) (ref <130)
Total CHOL/HDL Ratio: 3.3 (calc) (ref <5.0)
Triglycerides: 82 mg/dL (ref <150)

## 2020-12-05 LAB — HEMOGLOBIN A1C
Hgb A1c MFr Bld: 6.8 % of total Hgb — ABNORMAL HIGH (ref <5.7)
Mean Plasma Glucose: 148 mg/dL
eAG (mmol/L): 8.2 mmol/L

## 2021-02-25 ENCOUNTER — Other Ambulatory Visit: Payer: Self-pay

## 2021-02-25 ENCOUNTER — Encounter: Payer: Self-pay | Admitting: Internal Medicine

## 2021-02-25 ENCOUNTER — Ambulatory Visit (INDEPENDENT_AMBULATORY_CARE_PROVIDER_SITE_OTHER): Payer: 59 | Admitting: Internal Medicine

## 2021-02-25 DIAGNOSIS — I10 Essential (primary) hypertension: Secondary | ICD-10-CM | POA: Diagnosis not present

## 2021-02-25 DIAGNOSIS — E782 Mixed hyperlipidemia: Secondary | ICD-10-CM | POA: Diagnosis not present

## 2021-02-25 DIAGNOSIS — E1165 Type 2 diabetes mellitus with hyperglycemia: Secondary | ICD-10-CM | POA: Diagnosis not present

## 2021-02-25 NOTE — Progress Notes (Signed)
Subjective:    Patient ID: Kathy [name], female    DOB: 1975-10-27, 45 y.o.   MRN: [id number]  HPI  Pt presents to the clinic today for follow up of chronic conditions.  HTN: Her BP today is 165/67. She is taking Amlodipine, Spironolactone and Losartan as prescribed but admits that she has not taken her medication today. ECG from 01/2015 reviewed.  HLD: Her last LDL was 67, triglycerides 82, 11/2020. She denies myalgias on Simvastatin. She tries to consume a low fat diet.  DM 2: Her last A1C was 6.8, 11/2020. She is not taking Metformin as prescribed. She does not check her sugars. She checks her feet routinely. Her last eye exam was 04/2020. Flu 02/2021. 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 Covid Pfizer x 2.  Review of Systems     Past Medical History:  Diagnosis Date   Hypertension     Current Outpatient Medications  Medication Sig Dispense Refill   albuterol (VENTOLIN HFA) 108 (90 Base) MCG/ACT inhaler Inhale 2 puffs into the lungs every 6 (six) hours as needed for shortness of breath. 6.7 g 0   albuterol (VENTOLIN HFA) 108 (90 Base) MCG/ACT inhaler INHALE 2 PUFFS BY MOUTH EVERY 6 HOURS AS NEEDED FOR SHORTNESS OF BREATH. 18 g 0   amLODipine (NORVASC) 10 MG tablet TAKE 1 TABLET (10 MG TOTAL) BY MOUTH DAILY. 90 tablet 3   fluticasone (FLONASE) 50 MCG/ACT nasal spray Place 2 sprays into both nostrils daily. 48 g 0   losartan (COZAAR) 50 MG tablet TAKE 1 TABLET BY MOUTH DAILY. 90 tablet 0   simvastatin (ZOCOR) 10 MG tablet Take 1 tablet (10 mg total) by mouth at bedtime. 90 tablet 0   spironolactone (ALDACTONE) 25 MG tablet Take 1 tablet (25 mg total) by mouth daily. 90 tablet 0   metFORMIN (GLUCOPHAGE) 500 MG tablet TAKE 1 TABLET BY MOUTH TWICE DAILY WITH A MEAL. (Patient not taking: Reported on 02/25/2021) 60 tablet 2   No current facility-administered medications for this visit.    No Known Allergies  Family History  Problem Relation Age of Onset   Hypertension Mother    Diabetes  Maternal Aunt    Breast cancer Neg Hx     Social History   Socioeconomic History   Marital status: Single    Spouse name: Not on file   Number of children: Not on file   Years of education: Not on file   Highest education level: Not on file  Occupational History   Not on file  Tobacco Use   Smoking status: Never   Smokeless tobacco: Never  Vaping Use   Vaping Use: Never used  Substance and Sexual Activity   Alcohol use: Yes    Alcohol/week: 0.0 standard drinks    Comment: rare   Drug use: No   Sexual activity: Yes    Birth control/protection: Surgical  Other Topics Concern   Not on file  Social History Narrative   Not on file   Social Determinants of Health   Financial Resource Strain: Not on file  Food Insecurity: Not on file  Transportation Needs: Not on file  Physical Activity: Not on file  Stress: Not on file  Social Connections: Not on file  Intimate Partner Violence: Not on file     Constitutional: Denies fever, malaise, fatigue, headache or abrupt weight changes.  HEENT: Denies eye pain, eye redness, ear pain, ringing in the ears, wax buildup, runny nose, nasal congestion, bloody nose, or sore throat. Respiratory: Denies difficulty  breathing, shortness of breath, cough or sputum production.   Cardiovascular: Denies chest pain, chest tightness, palpitations or swelling in the hands or feet.  Gastrointestinal: Denies abdominal pain, bloating, constipation, diarrhea or blood in the stool.  GU: Denies urgency, frequency, pain with urination, burning sensation, blood in urine, odor or discharge. Skin: Denies redness, rashes, lesions or ulcercations.  Neurological: Denies dizziness, difficulty with memory, difficulty with speech or problems with balance and coordination.  No other specific complaints in a complete review of systems (except as listed in HPI above).  Objective:   Physical Exam  BP (!) 165/67 (BP Location: Right Arm, Patient Position: Sitting,  Cuff Size: Large)   Pulse 64   Temp 98.4 F (36.9 C) (Temporal)   Resp 17   Ht 5\' 4"  (1.626 m)   Wt 268 lb (121.6 kg)   SpO2 100%   BMI 46.00 kg/m  Wt Readings from Last 3 Encounters:  02/25/21 268 lb (121.6 kg)  11/27/20 266 lb 12.8 oz (121 kg)  09/17/20 268 lb (121.6 kg)    [name]: Appears her stated age, obese, in NAD. Skin: Warm, dry and intact. No ulcerations noted. HEENT: Head: normal shape and size; Eyes: sclera white and EOMs intact;  Cardiovascular: Normal rate and rhythm. S1,S2 noted.  No murmur, rubs or gallops noted. No JVD or BLE edema.  Pulmonary/Chest: Normal effort and positive vesicular breath sounds. No respiratory distress. No wheezes, rales or ronchi noted.  Neurological: Alert and oriented.    BMET    Component Value Date/Time   NA 138 12/04/2020 0757   K 3.5 12/04/2020 0757   CL 104 12/04/2020 0757   CO2 28 12/04/2020 0757   GLUCOSE 120 (H) 12/04/2020 0757   BUN 9 12/04/2020 0757   CREATININE 0.67 12/04/2020 0757   CALCIUM 8.6 12/04/2020 0757   GFRNONAA 108 12/04/2020 0757   GFRAA 125 12/04/2020 0757    Lipid Panel     Component Value Date/Time   CHOL 119 12/04/2020 0757   TRIG 82 12/04/2020 0757   HDL 36 (L) 12/04/2020 0757   CHOLHDL 3.3 12/04/2020 0757   VLDL 55.4 (H) 08/28/2020 1507   LDLCALC 67 12/04/2020 0757    CBC    Component Value Date/Time   WBC 5.7 08/28/2020 1507   RBC 4.45 08/28/2020 1507   HGB 11.3 (L) 08/28/2020 1507   HCT 34.3 (L) 08/28/2020 1507   PLT 241.0 08/28/2020 1507   MCV 77.1 (L) 08/28/2020 1507   MCH 27.9 01/05/2014 1353   MCHC 33.0 08/28/2020 1507   RDW 18.0 (H) 08/28/2020 1507    Hgb A1C Lab Results  Component Value Date   HGBA1C 6.8 (H) 12/04/2020            Assessment & Plan:     12/06/2020, NP This visit occurred during the SARS-CoV-2 public health emergency.  Safety protocols were in place, including screening questions prior to the visit, additional usage of staff PPE, and  extensive cleaning of exam room while observing appropriate contact time as indicated for disinfecting solutions.

## 2021-02-25 NOTE — Assessment & Plan Note (Signed)
Encouraged diet and exercise for weight loss ?

## 2021-02-25 NOTE — Patient Instructions (Signed)
Carbohydrate Counting for Diabetes Mellitus, Adult Carbohydrate counting is a method of keeping track of how many carbohydrates you eat. Eating carbohydrates naturally increases the amount of sugar (glucose) in the blood. Counting how many carbohydrates you eat improves your blood glucose control, which helps you manage your diabetes. It is important to know how many carbohydrates you can safely have in each meal. This is different for every person. A dietitian can help you make a meal plan and calculate how many carbohydrates you should have at each meal and snack. What foods contain carbohydrates? Carbohydrates are found in the following foods: Grains, such as breads and cereals. Dried beans and soy products. Starchy vegetables, such as potatoes, peas, and corn. Fruit and fruit juices. Milk and yogurt. Sweets and snack foods, such as cake, cookies, candy, chips, and soft drinks. How do I count carbohydrates in foods? There are two ways to count carbohydrates in food. You can read food labels or learn standard serving sizes of foods. You can use either of the methods or a combination of both. Using the Nutrition Facts label The Nutrition Facts list is included on the labels of almost all packaged foods and beverages in the [place] It includes: The serving size. Information about nutrients in each serving, including the grams (g) of carbohydrate per serving. To use the Nutrition Facts: Decide how many servings you will have. Multiply the number of servings by the number of carbohydrates per serving. The resulting number is the total amount of carbohydrates that you will be having. Learning the standard serving sizes of foods When you eat carbohydrate foods that are not packaged or do not include Nutrition Facts on the label, you need to measure the servings in order to count the amount of carbohydrates. Measure the foods that you will eat with a food scale or measuring cup, if needed. Decide how  many standard-size servings you will eat. Multiply the number of servings by 15. For foods that contain carbohydrates, one serving equals 15 g of carbohydrates. For example, if you eat 2 cups or 10 oz (300 g) of strawberries, you will have eaten 2 servings and 30 g of carbohydrates (2 servings x 15 g = 30 g). For foods that have more than one food mixed, such as soups and casseroles, you must count the carbohydrates in each food that is included. The following list contains standard serving sizes of common carbohydrate-rich foods. Each of these servings has about 15 g of carbohydrates: 1 slice of bread. 1 six-inch (15 cm) tortilla. ? cup or 2 oz (53 g) cooked rice or pasta.  cup or 3 oz (85 g) cooked or canned, drained and rinsed beans or lentils.  cup or 3 oz (85 g) starchy vegetable, such as peas, corn, or squash.  cup or 4 oz (120 g) hot cereal.  cup or 3 oz (85 g) boiled or mashed potatoes, or  or 3 oz (85 g) of a large baked potato.  cup or 4 fl oz (118 mL) fruit juice. 1 cup or 8 fl oz (237 mL) milk. 1 small or 4 oz (106 g) apple.  or 2 oz (63 g) of a medium banana. 1 cup or 5 oz (150 g) strawberries. 3 cups or 1 oz (24 g) popped popcorn. What is an example of carbohydrate counting? To calculate the number of carbohydrates in this sample meal, follow the steps shown below. Sample meal 3 oz (85 g) chicken breast. ? cup or 4 oz (106 g) brown   rice.  cup or 3 oz (85 g) corn. 1 cup or 8 fl oz (237 mL) milk. 1 cup or 5 oz (150 g) strawberries with sugar-free whipped topping. Carbohydrate calculation Identify the foods that contain carbohydrates: Rice. Corn. Milk. Strawberries. Calculate how many servings you have of each food: 2 servings rice. 1 serving corn. 1 serving milk. 1 serving strawberries. Multiply each number of servings by 15 g: 2 servings rice x 15 g = 30 g. 1 serving corn x 15 g = 15 g. 1 serving milk x 15 g = 15 g. 1 serving strawberries x 15 g = 15  g. Add together all of the amounts to find the total grams of carbohydrates eaten: 30 g + 15 g + 15 g + 15 g = 75 g of carbohydrates total. What are tips for following this plan? Shopping Develop a meal plan and then make a shopping list. Buy fresh and frozen vegetables, fresh and frozen fruit, dairy, eggs, beans, lentils, and whole grains. Look at food labels. Choose foods that have more fiber and less sugar. Avoid processed foods and foods with added sugars. Meal planning Aim to have the same amount of carbohydrates at each meal and for each snack time. Plan to have regular, balanced meals and snacks. Where to find more information American Diabetes Association: www.diabetes.org Centers for Disease Control and Prevention: www.cdc.gov Summary Carbohydrate counting is a method of keeping track of how many carbohydrates you eat. Eating carbohydrates naturally increases the amount of sugar (glucose) in the blood. Counting how many carbohydrates you eat improves your blood glucose control, which helps you manage your diabetes. A dietitian can help you make a meal plan and calculate how many carbohydrates you should have at each meal and snack. This information is not intended to replace advice given to you by your health care provider. Make sure you discuss any questions you have with your health care provider. Document Revised: 05/25/2019 Document Reviewed: 05/26/2019 Elsevier Patient Education  2021 Elsevier Inc.  

## 2021-02-25 NOTE — Assessment & Plan Note (Signed)
No urine microalbumin secondary to ARB therapy Encouraged her to consume a low carb diet and exercise for weight loss Currently not taking Metformin Advised her to send me a copy of her eye exam Encouraged routine foot exams She reports she has already had her flu shot Pneumovax UTD Encouraged her to get her covid booster

## 2021-02-25 NOTE — Assessment & Plan Note (Signed)
Uncontrolled but has not taken her medication today Encouraged medication compliance Continue Amlodipine, Spironolactone and Losartan as prescribed Reinforced DASH diet and exercise for weight loss  RTC in 2 days for BP check/POCT A1C

## 2021-02-25 NOTE — Assessment & Plan Note (Signed)
Encouraged her to consume a low fat diet Continue Simvastatin 

## 2021-02-27 ENCOUNTER — Other Ambulatory Visit: Payer: Self-pay

## 2021-02-27 ENCOUNTER — Ambulatory Visit (INDEPENDENT_AMBULATORY_CARE_PROVIDER_SITE_OTHER): Payer: 59

## 2021-02-27 DIAGNOSIS — E1165 Type 2 diabetes mellitus with hyperglycemia: Secondary | ICD-10-CM | POA: Diagnosis not present

## 2021-02-27 LAB — POCT GLYCOSYLATED HEMOGLOBIN (HGB A1C): Hemoglobin A1C: 6.6 % — AB (ref 4.0–5.6)

## 2021-04-23 ENCOUNTER — Other Ambulatory Visit: Payer: Self-pay | Admitting: Internal Medicine

## 2021-04-23 ENCOUNTER — Other Ambulatory Visit: Payer: Self-pay

## 2021-04-24 ENCOUNTER — Other Ambulatory Visit: Payer: Self-pay

## 2021-04-24 MED FILL — Simvastatin Tab 10 MG: ORAL | 90 days supply | Qty: 90 | Fill #0 | Status: AC

## 2021-04-24 MED FILL — Amlodipine Besylate Tab 10 MG (Base Equivalent): ORAL | 90 days supply | Qty: 90 | Fill #0 | Status: AC

## 2021-04-24 MED FILL — Losartan Potassium Tab 50 MG: ORAL | 90 days supply | Qty: 90 | Fill #0 | Status: AC

## 2021-04-24 MED FILL — Spironolactone Tab 25 MG: ORAL | 90 days supply | Qty: 90 | Fill #0 | Status: AC

## 2021-04-24 NOTE — Telephone Encounter (Signed)
LOV 02/25/21 No future OV scheduled. All meet protocol  Requested Prescriptions  Pending Prescriptions Disp Refills  . amLODipine (NORVASC) 10 MG tablet 90 tablet 0    Sig: TAKE 1 TABLET (10 MG TOTAL) BY MOUTH DAILY.     Cardiovascular:  Calcium Channel Blockers Failed - 04/23/2021  2:57 PM      Failed - Last BP in normal range    BP Readings from Last 1 Encounters:  02/25/21 (!) 165/67         Passed - Valid encounter within last 6 months    Recent Outpatient Visits          1 month ago Primary hypertension   [facility] [place], [place] W, NP   4 months ago Type 2 diabetes mellitus with hyperglycemia, without long-term current use of insulin [facility])   [facility] [place], [place] W, NP             . losartan (COZAAR) 50 MG tablet 90 tablet 0    Sig: TAKE 1 TABLET BY MOUTH DAILY.     Cardiovascular:  Angiotensin Receptor Blockers Failed - 04/23/2021  2:57 PM      Failed - Last BP in normal range    BP Readings from Last 1 Encounters:  02/25/21 (!) 165/67         Passed - Cr in normal range and within 180 days    Creat  Date Value Ref Range Status  12/04/2020 0.67 0.50 - 1.10 mg/dL Final         Passed - K in normal range and within 180 days    Potassium  Date Value Ref Range Status  12/04/2020 3.5 3.5 - 5.3 mmol/L Final         Passed - Patient is not pregnant      Passed - Valid encounter within last 6 months    Recent Outpatient Visits          1 month ago Primary hypertension   [facility] [place], [place] W, NP   4 months ago Type 2 diabetes mellitus with hyperglycemia, without long-term current use of insulin (HCC)   [facility] [place], [place] W, NP             . spironolactone (ALDACTONE) 25 MG tablet 90 tablet 0    Sig: TAKE 1 TABLET (25 MG TOTAL) BY MOUTH DAILY.     Cardiovascular: Diuretics - Aldosterone Antagonist Failed - 04/23/2021  2:57 PM      Failed - Last BP in normal range    BP  Readings from Last 1 Encounters:  02/25/21 (!) 165/67         Passed - Cr in normal range and within 360 days    Creat  Date Value Ref Range Status  12/04/2020 0.67 0.50 - 1.10 mg/dL Final         Passed - K in normal range and within 360 days    Potassium  Date Value Ref Range Status  12/04/2020 3.5 3.5 - 5.3 mmol/L Final         Passed - Na in normal range and within 360 days    Sodium  Date Value Ref Range Status  12/04/2020 138 135 - 146 mmol/L Final         Passed - Valid encounter within last 6 months    Recent Outpatient Visits          1 month ago Primary  hypertension   [facility] [place], [place] W, NP   4 months ago Type 2 diabetes mellitus with hyperglycemia, without long-term current use of insulin [facility])   [facility] [place], [place] W, NP             . simvastatin (ZOCOR) 10 MG tablet 90 tablet 0    Sig: Take 1 tablet (10 mg total) by mouth at bedtime.     Cardiovascular:  Antilipid - Statins Failed - 04/23/2021  2:57 PM      Failed - HDL in normal range and within 360 days    HDL  Date Value Ref Range Status  12/04/2020 36 (L) > OR = 50 mg/dL Final         Passed - Total Cholesterol in normal range and within 360 days    Cholesterol  Date Value Ref Range Status  12/04/2020 119 <200 mg/dL Final         Passed - LDL in normal range and within 360 days    LDL Cholesterol (Calc)  Date Value Ref Range Status  12/04/2020 67 mg/dL (calc) Final    Comment:    Reference range: <100 . Desirable range <100 mg/dL for primary prevention;   <70 mg/dL for patients with CHD or diabetic patients  with > or = 2 CHD risk factors. Marland Kitchen LDL-C is now calculated using the Martin-Hopkins  calculation, which is a validated novel method providing  better accuracy than the Friedewald equation in the  estimation of LDL-C.  Horald Pollen et al. Lenox Ahr. 9381;829(93): 2061-2068  (http://education.[employer].com/faq/FAQ164)    Direct LDL   Date Value Ref Range Status  08/28/2020 62.0 mg/dL Final    Comment:    Optimal:  <100 mg/dLNear or Above Optimal:  100-129 mg/dLBorderline High:  130-159 mg/dLHigh:  160-189 mg/dLVery High:  >190 mg/dL         Passed - Triglycerides in normal range and within 360 days    Triglycerides  Date Value Ref Range Status  12/04/2020 82 <150 mg/dL Final         Passed - Patient is not pregnant      Passed - Valid encounter within last 12 months    Recent Outpatient Visits          1 month ago Primary hypertension   [facility] [place], [place], NP   4 months ago Type 2 diabetes mellitus with hyperglycemia, without long-term current use of insulin [facility])   [facility] [place], [place], NP

## 2021-05-12 ENCOUNTER — Ambulatory Visit: Payer: 59 | Admitting: Internal Medicine

## 2021-05-12 ENCOUNTER — Other Ambulatory Visit: Payer: Self-pay

## 2021-05-12 ENCOUNTER — Encounter: Payer: Self-pay | Admitting: Internal Medicine

## 2021-05-12 VITALS — BP 136/67 | HR 71 | Temp 97.3°F | Resp 17 | Ht 64.0 in | Wt 267.2 lb

## 2021-05-12 DIAGNOSIS — I10 Essential (primary) hypertension: Secondary | ICD-10-CM | POA: Diagnosis not present

## 2021-05-12 DIAGNOSIS — E782 Mixed hyperlipidemia: Secondary | ICD-10-CM

## 2021-05-12 DIAGNOSIS — R8290 Unspecified abnormal findings in urine: Secondary | ICD-10-CM

## 2021-05-12 DIAGNOSIS — E1165 Type 2 diabetes mellitus with hyperglycemia: Secondary | ICD-10-CM | POA: Diagnosis not present

## 2021-05-12 DIAGNOSIS — D500 Iron deficiency anemia secondary to blood loss (chronic): Secondary | ICD-10-CM

## 2021-05-12 DIAGNOSIS — M545 Low back pain, unspecified: Secondary | ICD-10-CM | POA: Diagnosis not present

## 2021-05-12 DIAGNOSIS — D649 Anemia, unspecified: Secondary | ICD-10-CM | POA: Insufficient documentation

## 2021-05-12 LAB — POCT URINALYSIS DIPSTICK
Bilirubin, UA: NEGATIVE
Glucose, UA: POSITIVE — AB
Ketones, UA: NEGATIVE
Leukocytes, UA: NEGATIVE
Nitrite, UA: NEGATIVE
Protein, UA: NEGATIVE
Spec Grav, UA: 1.015 (ref 1.010–1.025)
Urobilinogen, UA: 0.2 E.U./dL
pH, UA: 5 (ref 5.0–8.0)

## 2021-05-12 NOTE — Assessment & Plan Note (Signed)
Controlled on Losartan, Amlodipine, and Spironolactone Reinforced DASH diet and exercise for weight loss

## 2021-05-12 NOTE — Assessment & Plan Note (Signed)
Will check CBC in 2 weeks with other labs

## 2021-05-12 NOTE — Assessment & Plan Note (Signed)
Encourage diet and exercise for weight loss 

## 2021-05-12 NOTE — Assessment & Plan Note (Signed)
We will check c-Met and lipid profile in 2 weeks Encouraged her to consume a low-fat diet Continue Simvastatin

## 2021-05-12 NOTE — Assessment & Plan Note (Signed)
She will have her A1c checked in 2 weeks No urine microalbumin secondary to ACEI therapy Encouraged her to consume a low-carb diet and exercise for weight loss Advised her to schedule an appointment for an eye exam Encourage routine foot exams Flu, Pneumovax UTD Encouraged her to get a COVID booster

## 2021-05-12 NOTE — Progress Notes (Signed)
Subjective:    Patient ID: Kathy [name], female    DOB: 1975-12-21, 45 y.o.   MRN: [id number]  HPI  Pt presents to the clinic today for 3 month follow up.  HTN: Her BP today is 136/67. She is taking Losartan, Amlodipine and Spironolactone as prescribed. ECG from 01/2015 reviewed.  HLD: Her last LDL was 67, triglycerides 82, 11/2020. She denies myalgias on Simvastatin. She does not consume a low fat diet.  DM 2: Her last A1C was 6.6%, 02/2021.  She does not check her sugars.  She is not taking any oral diabetic medication at this time.  She checks her feet routinely. Flu 02/2021. 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  Anemia: Her last H/H was 11.3, 34.3, 11/2020.  She is not taking any oral iron at this time.  She does not follow with hematology.  She also reports low back pain.  She reports this started 3 weeks ago.  She reports the pain is intermittent.  The pain can be dull or achy or sharp and stabbing with certain movements.  The pain does not radiate down her legs.  She denies urinary urgency, frequency, dysuria, blood in urine, loss of bladder control, vaginal discharge, odor, irritation, vaginal bleeding, diarrhea, constipation or blood in her stool.  She denies any injury to her back that she is aware of.  She has not taken anything OTC for symptoms.  Review of Systems     Past Medical History:  Diagnosis Date   Hypertension     Current Outpatient Medications  Medication Sig Dispense Refill   albuterol (VENTOLIN HFA) 108 (90 Base) MCG/ACT inhaler Inhale 2 puffs into the lungs every 6 (six) hours as needed for shortness of breath. 6.7 g 0   albuterol (VENTOLIN HFA) 108 (90 Base) MCG/ACT inhaler INHALE 2 PUFFS BY MOUTH EVERY 6 HOURS AS NEEDED FOR SHORTNESS OF BREATH. 18 g 0   amLODipine (NORVASC) 10 MG tablet TAKE 1 TABLET (10 MG TOTAL) BY MOUTH DAILY. 90 tablet 0   fluticasone (FLONASE) 50 MCG/ACT nasal spray Place 2 sprays into both nostrils daily. 48 g 0   losartan (COZAAR)  50 MG tablet TAKE 1 TABLET BY MOUTH DAILY. 90 tablet 0   metFORMIN (GLUCOPHAGE) 500 MG tablet TAKE 1 TABLET BY MOUTH TWICE DAILY WITH A MEAL. (Patient not taking: Reported on 02/25/2021) 60 tablet 2   simvastatin (ZOCOR) 10 MG tablet Take 1 tablet (10 mg total) by mouth at bedtime. 90 tablet 0   spironolactone (ALDACTONE) 25 MG tablet TAKE 1 TABLET (25 MG TOTAL) BY MOUTH DAILY. 90 tablet 0   No current facility-administered medications for this visit.    No Known Allergies  Family History  Problem Relation Age of Onset   Hypertension Mother    Diabetes Maternal Aunt    Breast cancer Neg Hx     Social History   Socioeconomic History   Marital status: Single    Spouse name: Not on file   Number of children: Not on file   Years of education: Not on file   Highest education level: Not on file  Occupational History   Not on file  Tobacco Use   Smoking status: Never   Smokeless tobacco: Never  Vaping Use   Vaping Use: Never used  Substance and Sexual Activity   Alcohol use: Yes    Alcohol/week: 0.0 standard drinks    Comment: rare   Drug use: No   Sexual activity: Yes    Birth  control/protection: Surgical  Other Topics Concern   Not on file  Social History Narrative   Not on file   Social Determinants of Health   Financial Resource Strain: Not on file  Food Insecurity: Not on file  Transportation Needs: Not on file  Physical Activity: Not on file  Stress: Not on file  Social Connections: Not on file  Intimate Partner Violence: Not on file     Constitutional: Denies fever, malaise, fatigue, headache or abrupt weight changes.  HEENT: Denies eye pain, eye redness, ear pain, ringing in the ears, wax buildup, runny nose, nasal congestion, bloody nose, or sore throat. Respiratory: Denies difficulty breathing, shortness of breath, cough or sputum production.   Cardiovascular: Denies chest pain, chest tightness, palpitations or swelling in the hands or feet.   Gastrointestinal: Denies abdominal pain, bloating, constipation, diarrhea or blood in the stool.  GU: Denies urgency, frequency, pain with urination, burning sensation, blood in urine, odor or discharge. Musculoskeletal: Pt reports low back pain. Denies decrease in range of motion, difficulty with gait,  or joint pain and swelling.  Skin: Denies redness, rashes, lesions or ulcercations.  Neurological: Denies dizziness, difficulty with memory, difficulty with speech or problems with balance and coordination.   No other specific complaints in a complete review of systems (except as listed in HPI above).  Objective:   Physical Exam  BP 136/67 (BP Location: Right Arm, Patient Position: Sitting, Cuff Size: Large)   Pulse 71   Temp (!) 97.3 F (36.3 C) (Temporal)   Resp 17   Ht 5\' 4"  (1.626 m)   Wt 267 lb 3.2 oz (121.2 kg)   LMP 04/22/2021   SpO2 100%   BMI 45.86 kg/m   Wt Readings from Last 3 Encounters:  02/25/21 268 lb (121.6 kg)  11/27/20 266 lb 12.8 oz (121 kg)  09/17/20 268 lb (121.6 kg)    [name]: Appears her stated age, obese, in NAD. Skin: Warm, dry and intact. No ulcerations noted. HEENT: Head: normal shape and size; Eyes: sclera white and EOMs intact;  Cardiovascular: Normal rate and rhythm. S1,S2 noted.  No murmur, rubs or gallops noted. No JVD or BLE edema.  Pulmonary/Chest: Normal effort and positive vesicular breath sounds. No respiratory distress. No wheezes, rales or ronchi noted.  Abdomen: No CVA tenderness noted. Musculoskeletal: Normal flexion, extension, rotation and lateral bending of the spine.  No bony tenderness noted over the spine.  Pain with palpation of bilateral paralumbar muscles.  Gait slow and steady without device. Neurological: Alert and oriented.    BMET    Component Value Date/Time   NA 138 12/04/2020 0757   K 3.5 12/04/2020 0757   CL 104 12/04/2020 0757   CO2 28 12/04/2020 0757   GLUCOSE 120 (H) 12/04/2020 0757   BUN 9 12/04/2020 0757    CREATININE 0.67 12/04/2020 0757   CALCIUM 8.6 12/04/2020 0757   GFRNONAA 108 12/04/2020 0757   GFRAA 125 12/04/2020 0757    Lipid Panel     Component Value Date/Time   CHOL 119 12/04/2020 0757   TRIG 82 12/04/2020 0757   HDL 36 (L) 12/04/2020 0757   CHOLHDL 3.3 12/04/2020 0757   VLDL 55.4 (H) 08/28/2020 1507   LDLCALC 67 12/04/2020 0757    CBC    Component Value Date/Time   WBC 5.7 08/28/2020 1507   RBC 4.45 08/28/2020 1507   HGB 11.3 (L) 08/28/2020 1507   HCT 34.3 (L) 08/28/2020 1507   PLT 241.0 08/28/2020 1507  MCV 77.1 (L) 08/28/2020 1507   MCH 27.9 01/05/2014 1353   MCHC 33.0 08/28/2020 1507   RDW 18.0 (H) 08/28/2020 1507    Hgb A1C Lab Results  Component Value Date   HGBA1C 6.6 (A) 02/27/2021            Assessment & Plan:    Bilateral Low Back Pain:  Urinalysis: Trace blood, trace glucose We will send urine culture Push fluids Encourage stretching, heat massage Okay to take Ibuprofen 600 mg every 8 hours as needed for back pain  Schedule a lab only visit in 2 weeks for lab work Nicki Reaper, NP This visit occurred during the SARS-CoV-2 public health emergency.  Safety protocols were in place, including screening questions prior to the visit, additional usage of staff PPE, and extensive cleaning of exam room while observing appropriate contact time as indicated for disinfecting solutions.

## 2021-05-12 NOTE — Patient Instructions (Signed)

## 2021-05-13 LAB — URINE CULTURE
MICRO NUMBER:: 12715053
SPECIMEN QUALITY:: ADEQUATE

## 2021-05-30 ENCOUNTER — Other Ambulatory Visit: Payer: 59

## 2021-06-03 ENCOUNTER — Other Ambulatory Visit: Payer: Self-pay

## 2021-06-03 ENCOUNTER — Other Ambulatory Visit: Payer: 59

## 2021-06-03 DIAGNOSIS — E1165 Type 2 diabetes mellitus with hyperglycemia: Secondary | ICD-10-CM | POA: Diagnosis not present

## 2021-06-04 LAB — COMPLETE METABOLIC PANEL WITH GFR
AG Ratio: 0.9 (calc) — ABNORMAL LOW (ref 1.0–2.5)
ALT: 14 U/L (ref 6–29)
AST: 14 U/L (ref 10–30)
Albumin: 3.4 g/dL — ABNORMAL LOW (ref 3.6–5.1)
Alkaline phosphatase (APISO): 77 U/L (ref 31–125)
BUN: 7 mg/dL (ref 7–25)
CO2: 27 mmol/L (ref 20–32)
Calcium: 8.3 mg/dL — ABNORMAL LOW (ref 8.6–10.2)
Chloride: 102 mmol/L (ref 98–110)
Creat: 0.7 mg/dL (ref 0.50–0.99)
Globulin: 3.8 g/dL (calc) — ABNORMAL HIGH (ref 1.9–3.7)
Glucose, Bld: 109 mg/dL — ABNORMAL HIGH (ref 65–99)
Potassium: 3.9 mmol/L (ref 3.5–5.3)
Sodium: 136 mmol/L (ref 135–146)
Total Bilirubin: 0.5 mg/dL (ref 0.2–1.2)
Total Protein: 7.2 g/dL (ref 6.1–8.1)
eGFR: 110 mL/min/{1.73_m2} (ref >=60)

## 2021-06-04 LAB — LIPID PANEL
Cholesterol: 134 mg/dL (ref <200)
HDL: 37 mg/dL — ABNORMAL LOW (ref >=50)
LDL Cholesterol (Calc): 76 mg/dL (calc)
Non-HDL Cholesterol (Calc): 97 mg/dL (calc) (ref <130)
Total CHOL/HDL Ratio: 3.6 (calc) (ref <5.0)
Triglycerides: 127 mg/dL (ref <150)

## 2021-06-04 LAB — CBC
HCT: 35.8 % (ref 35.0–45.0)
Hemoglobin: 11.1 g/dL — ABNORMAL LOW (ref 11.7–15.5)
MCH: 24 pg — ABNORMAL LOW (ref 27.0–33.0)
MCHC: 31 g/dL — ABNORMAL LOW (ref 32.0–36.0)
MCV: 77.5 fL — ABNORMAL LOW (ref 80.0–100.0)
MPV: 10.8 fL (ref 7.5–12.5)
Platelets: 279 10*3/uL (ref 140–400)
RBC: 4.62 10*6/uL (ref 3.80–5.10)
RDW: 18.6 % — ABNORMAL HIGH (ref 11.0–15.0)
WBC: 4.8 10*3/uL (ref 3.8–10.8)

## 2021-06-04 LAB — HEMOGLOBIN A1C
Hgb A1c MFr Bld: 6.9 % of total Hgb — ABNORMAL HIGH (ref <5.7)
Mean Plasma Glucose: 151 mg/dL
eAG (mmol/L): 8.4 mmol/L

## 2021-10-09 ENCOUNTER — Other Ambulatory Visit: Payer: Self-pay

## 2021-10-09 ENCOUNTER — Encounter: Payer: Self-pay | Admitting: Internal Medicine

## 2021-10-09 ENCOUNTER — Ambulatory Visit (INDEPENDENT_AMBULATORY_CARE_PROVIDER_SITE_OTHER): Payer: 59 | Admitting: Internal Medicine

## 2021-10-09 VITALS — BP 158/100 | HR 76 | Temp 97.3°F | Ht 64.0 in | Wt 274.0 lb

## 2021-10-09 DIAGNOSIS — Z1231 Encounter for screening mammogram for malignant neoplasm of breast: Secondary | ICD-10-CM | POA: Diagnosis not present

## 2021-10-09 DIAGNOSIS — Z0001 Encounter for general adult medical examination with abnormal findings: Secondary | ICD-10-CM

## 2021-10-09 DIAGNOSIS — E1165 Type 2 diabetes mellitus with hyperglycemia: Secondary | ICD-10-CM | POA: Diagnosis not present

## 2021-10-09 DIAGNOSIS — I10 Essential (primary) hypertension: Secondary | ICD-10-CM

## 2021-10-09 LAB — HM DIABETES EYE EXAM

## 2021-10-09 MED ORDER — SIMVASTATIN 10 MG PO TABS
10.0000 mg | ORAL_TABLET | Freq: Every day | ORAL | 1 refills | Status: DC
  Filled 2021-10-09: qty 90, 90d supply, fill #0

## 2021-10-09 MED ORDER — LOSARTAN POTASSIUM 50 MG PO TABS
ORAL_TABLET | ORAL | 1 refills | Status: DC
  Filled 2021-10-09: qty 90, 90d supply, fill #0

## 2021-10-09 MED ORDER — AMLODIPINE BESYLATE 10 MG PO TABS
10.0000 mg | ORAL_TABLET | Freq: Every day | ORAL | 1 refills | Status: DC
  Filled 2021-10-09: qty 90, 90d supply, fill #0

## 2021-10-09 MED ORDER — SPIRONOLACTONE 25 MG PO TABS
25.0000 mg | ORAL_TABLET | Freq: Every day | ORAL | 1 refills | Status: DC
  Filled 2021-10-09: qty 90, 90d supply, fill #0
  Filled 2022-02-03: qty 90, 90d supply, fill #1

## 2021-10-09 NOTE — Assessment & Plan Note (Signed)
Encouraged diet and exercise for weight loss ?

## 2021-10-09 NOTE — Assessment & Plan Note (Signed)
Elevated today ?Continue current meds ?Reinforced DASH diet and exercise for weight loss ? ? ?RTC in 2 weeks for BP check ?

## 2021-10-09 NOTE — Progress Notes (Signed)
? ?Subjective:  ? ? Patient ID: Kathy [name], female    DOB: Feb 19, 1976, 46 y.o.   MRN: [id number] ? ?HPI ? ?Patient presents to clinic today for her annual exam. ? ?Of note, her BP today is 164/96. She reports she has been taking her blood pressure medication. She did work out in the yard prior to coming to the appt. ? ?Flu: 02/2021 ?Tetanus: 04/2016 ?COVID: Pfizer x2 ?Pneumovax: 10/2019 ?Pap smear: 09/2017 ?Mammogram: 03/2019 ?Vision screening: annually ?Dentist: biannually ? ?Diet: She does eat meat. She consumes fruits and veggies. She does eat some fried foods. She drinks mostly water, juice. ?Exercise: Walking ? ? ?Review of Systems ? ?   ?Past Medical History:  ?Diagnosis Date  ? Hypertension   ? ? ?Current Outpatient Medications  ?Medication Sig Dispense Refill  ? albuterol (VENTOLIN HFA) 108 (90 Base) MCG/ACT inhaler Inhale 2 puffs into the lungs every 6 (six) hours as needed for shortness of breath. 6.7 g 0  ? albuterol (VENTOLIN HFA) 108 (90 Base) MCG/ACT inhaler INHALE 2 PUFFS BY MOUTH EVERY 6 HOURS AS NEEDED FOR SHORTNESS OF BREATH. 18 g 0  ? amLODipine (NORVASC) 10 MG tablet TAKE 1 TABLET (10 MG TOTAL) BY MOUTH DAILY. 90 tablet 0  ? fluticasone (FLONASE) 50 MCG/ACT nasal spray Place 2 sprays into both nostrils daily. 48 g 0  ? losartan (COZAAR) 50 MG tablet TAKE 1 TABLET BY MOUTH DAILY. 90 tablet 0  ? simvastatin (ZOCOR) 10 MG tablet Take 1 tablet (10 mg total) by mouth at bedtime. 90 tablet 0  ? spironolactone (ALDACTONE) 25 MG tablet TAKE 1 TABLET (25 MG TOTAL) BY MOUTH DAILY. 90 tablet 0  ? ?No current facility-administered medications for this visit.  ? ? ?No Known Allergies ? ?Family History  ?Problem Relation Age of Onset  ? Hypertension Mother   ? Diabetes Maternal Aunt   ? Breast cancer Neg Hx   ? ? ?Social History  ? ?Socioeconomic History  ? Marital status: Single  ?  Spouse name: Not on file  ? Number of children: Not on file  ? Years of education: Not on file  ? Highest education level:  Not on file  ?Occupational History  ? Not on file  ?Tobacco Use  ? Smoking status: Never  ? Smokeless tobacco: Never  ?Vaping Use  ? Vaping Use: Never used  ?Substance and Sexual Activity  ? Alcohol use: Yes  ?  Alcohol/week: 0.0 standard drinks  ?  Comment: rare  ? Drug use: No  ? Sexual activity: Yes  ?  Birth control/protection: Surgical  ?Other Topics Concern  ? Not on file  ?Social History Narrative  ? Not on file  ? ?Social Determinants of Health  ? ?Financial Resource Strain: Not on file  ?Food Insecurity: Not on file  ?Transportation Needs: Not on file  ?Physical Activity: Not on file  ?Stress: Not on file  ?Social Connections: Not on file  ?Intimate Partner Violence: Not on file  ? ? ? ?Constitutional: Denies fever, malaise, fatigue, headache or abrupt weight changes.  ?HEENT: Denies eye pain, eye redness, ear pain, ringing in the ears, wax buildup, runny nose, nasal congestion, bloody nose, or sore throat. ?Respiratory: Denies difficulty breathing, shortness of breath, cough or sputum production.   ?Cardiovascular: Denies chest pain, chest tightness, palpitations or swelling in the hands or feet.  ?Gastrointestinal: Denies abdominal pain, bloating, constipation, diarrhea or blood in the stool.  ?GU: Denies urgency, frequency, pain with urination, burning sensation,  blood in urine, odor or discharge. ?Musculoskeletal: Pt reports muscle cramps. Denies decrease in range of motion, difficulty with gait, or joint pain and swelling.  ?Skin: Denies redness, rashes, lesions or ulcercations.  ?Neurological: Denies dizziness, difficulty with memory, difficulty with speech or problems with balance and coordination.  ?Psych: Denies anxiety, depression, SI/HI. ? ?No other specific complaints in a complete review of systems (except as listed in HPI above). ? ?Objective:  ? Physical Exam ? ? ?BP (!) 164/96 (BP Location: Left Arm, Patient Position: Sitting, Cuff Size: Large)   Pulse 76   Temp (!) 97.3 ?F (36.3 ?C)  (Temporal)   Ht _0  (1.626 m)   Wt 274 lb (124.3 kg)   SpO2 97%   BMI 47.03 kg/m?  ? ?Wt Readings from Last 3 Encounters:  ?05/12/21 267 lb 3.2 oz (121.2 kg)  ?02/25/21 268 lb (121.6 kg)  ?11/27/20 266 lb 12.8 oz (121 kg)  ? ? ?General: Appears her stated age, obese, in NAD. ?Skin: Warm, dry and intact. N ?HEENT: Head: normal shape and size; Eyes: sclera white, no icterus, conjunctiva pink, PERRLA and EOMs intact;  ?Neck:  Neck supple, trachea midline. No masses, lumps or thyromegaly present.  ?Cardiovascular: Normal rate and rhythm. S1,S2 noted.  No murmur, rubs or gallops noted. No JVD or BLE edema.  ?Pulmonary/Chest: Normal effort and positive vesicular breath sounds. No respiratory distress. No wheezes, rales or ronchi noted.  ?Abdomen: Soft and nontender. Normal bowel sounds.  ?Musculoskeletal: Strength 5/5 BUE/BLE. No difficulty with gait.  ?Neurological: Alert and oriented. Cranial nerves II-XII grossly intact. Coordination normal.  ?Psychiatric: Mood and affect normal. Behavior is normal. Judgment and thought content normal.  ? ? ?BMET ?   ?Component Value Date/Time  ? NA 136 06/03/2021 0824  ? K 3.9 06/03/2021 0824  ? CL 102 06/03/2021 0824  ? CO2 27 06/03/2021 0824  ? GLUCOSE 109 (H) 06/03/2021 0824  ? BUN 7 06/03/2021 0824  ? CREATININE 0.70 06/03/2021 0824  ? CALCIUM 8.3 (L) 06/03/2021 0824  ? GFRNONAA 108 12/04/2020 0757  ? GFRAA 125 12/04/2020 0757  ? ? ?Lipid Panel  ?   ?Component Value Date/Time  ? CHOL 134 06/03/2021 0824  ? TRIG 127 06/03/2021 0824  ? HDL 37 (L) 06/03/2021 0824  ? CHOLHDL 3.6 06/03/2021 0824  ? VLDL 55.4 (H) 08/28/2020 1507  ? [place] 76 06/03/2021 0824  ? ? ?CBC ?   ?Component Value Date/Time  ? WBC 4.8 06/03/2021 0824  ? RBC 4.62 06/03/2021 0824  ? HGB 11.1 (L) 06/03/2021 0824  ? HCT 35.8 06/03/2021 0824  ? PLT 279 06/03/2021 0824  ? MCV 77.5 (L) 06/03/2021 0824  ? MCH 24.0 (L) 06/03/2021 1324  ? MCHC 31.0 (L) 06/03/2021 0824  ? RDW 18.6 (H) 06/03/2021 0824  ? ? ?Hgb  A1C ?Lab Results  ?Component Value Date  ? HGBA1C 6.9 (H) 06/03/2021  ? ? ? ? ? ? ?   ?Assessment & Plan:  ? ?Preventative Health Maintenance: ? ?Encouraged her to get a flu shot in the fall ?Tetanus UTD ?Encouraged her to get her COVID booster ?Pneumovax UTD ?Pap smear due 2024 ?Mammogram ordered-she will call to schedule ?Encouraged her to consume a balanced diet and exercise regimen ?Advised her to see an eye doctor and dentist annually ?We will check CBC, c-Met, lipid, A1c, urine microalbumin today ? ?RTC in 6 months, follow-up chronic conditions ?Webb Silversmith, NP ? ?

## 2021-10-09 NOTE — Patient Instructions (Signed)

## 2021-10-10 LAB — LIPID PANEL
Cholesterol: 147 mg/dL (ref <200)
HDL: 34 mg/dL — ABNORMAL LOW (ref >=50)
LDL Cholesterol (Calc): 76 mg/dL (calc)
Non-HDL Cholesterol (Calc): 113 mg/dL (calc) (ref <130)
Total CHOL/HDL Ratio: 4.3 (calc) (ref <5.0)
Triglycerides: 304 mg/dL — ABNORMAL HIGH (ref <150)

## 2021-10-10 LAB — HEMOGLOBIN A1C
Hgb A1c MFr Bld: 7.1 % of total Hgb — ABNORMAL HIGH (ref <5.7)
Mean Plasma Glucose: 157 mg/dL
eAG (mmol/L): 8.7 mmol/L

## 2021-10-10 LAB — COMPLETE METABOLIC PANEL WITH GFR
AG Ratio: 1 (calc) (ref 1.0–2.5)
ALT: 20 U/L (ref 6–29)
AST: 19 U/L (ref 10–30)
Albumin: 3.6 g/dL (ref 3.6–5.1)
Alkaline phosphatase (APISO): 90 U/L (ref 31–125)
BUN: 12 mg/dL (ref 7–25)
CO2: 28 mmol/L (ref 20–32)
Calcium: 8.9 mg/dL (ref 8.6–10.2)
Chloride: 102 mmol/L (ref 98–110)
Creat: 0.83 mg/dL (ref 0.50–0.99)
Globulin: 3.6 g/dL (calc) (ref 1.9–3.7)
Glucose, Bld: 120 mg/dL (ref 65–139)
Potassium: 3.6 mmol/L (ref 3.5–5.3)
Sodium: 137 mmol/L (ref 135–146)
Total Bilirubin: 0.5 mg/dL (ref 0.2–1.2)
Total Protein: 7.2 g/dL (ref 6.1–8.1)
eGFR: 89 mL/min/{1.73_m2} (ref >=60)

## 2021-10-10 LAB — CBC
HCT: 35.4 % (ref 35.0–45.0)
Hemoglobin: 11.1 g/dL — ABNORMAL LOW (ref 11.7–15.5)
MCH: 24 pg — ABNORMAL LOW (ref 27.0–33.0)
MCHC: 31.4 g/dL — ABNORMAL LOW (ref 32.0–36.0)
MCV: 76.5 fL — ABNORMAL LOW (ref 80.0–100.0)
MPV: 11 fL (ref 7.5–12.5)
Platelets: 263 10*3/uL (ref 140–400)
RBC: 4.63 10*6/uL (ref 3.80–5.10)
RDW: 18.2 % — ABNORMAL HIGH (ref 11.0–15.0)
WBC: 5.1 10*3/uL (ref 3.8–10.8)

## 2021-10-10 LAB — MICROALBUMIN / CREATININE URINE RATIO
Creatinine, Urine: 388 mg/dL — ABNORMAL HIGH (ref 20–275)
Microalb Creat Ratio: 48 mcg/mg creat — ABNORMAL HIGH (ref <30)
Microalb, Ur: 18.5 mg/dL

## 2021-10-23 ENCOUNTER — Encounter: Payer: Self-pay | Admitting: Internal Medicine

## 2021-10-23 ENCOUNTER — Ambulatory Visit: Payer: 59 | Admitting: Internal Medicine

## 2021-10-23 ENCOUNTER — Other Ambulatory Visit: Payer: Self-pay

## 2021-10-23 DIAGNOSIS — E1165 Type 2 diabetes mellitus with hyperglycemia: Secondary | ICD-10-CM | POA: Diagnosis not present

## 2021-10-23 DIAGNOSIS — D500 Iron deficiency anemia secondary to blood loss (chronic): Secondary | ICD-10-CM

## 2021-10-23 DIAGNOSIS — E782 Mixed hyperlipidemia: Secondary | ICD-10-CM | POA: Diagnosis not present

## 2021-10-23 DIAGNOSIS — I10 Essential (primary) hypertension: Secondary | ICD-10-CM | POA: Diagnosis not present

## 2021-10-23 MED ORDER — LOSARTAN POTASSIUM 100 MG PO TABS
100.0000 mg | ORAL_TABLET | Freq: Every day | ORAL | 0 refills | Status: DC
  Filled 2021-10-23: qty 90, 90d supply, fill #0

## 2021-10-23 MED ORDER — BLOOD GLUCOSE MONITOR SYSTEM W/DEVICE KIT
PACK | 0 refills | Status: DC
  Filled 2021-10-23: qty 1, 1d supply, fill #0

## 2021-10-23 MED ORDER — IRON (FERROUS SULFATE) 325 (65 FE) MG PO TABS
325.0000 mg | ORAL_TABLET | Freq: Every day | ORAL | 0 refills | Status: DC
  Filled 2021-10-23: qty 90, fill #0
  Filled 2021-11-23: qty 90, 90d supply, fill #0

## 2021-10-23 MED ORDER — SIMVASTATIN 20 MG PO TABS
20.0000 mg | ORAL_TABLET | Freq: Every day | ORAL | 0 refills | Status: DC
  Filled 2021-10-23: qty 90, 90d supply, fill #0

## 2021-10-23 MED ORDER — GLUCOSE BLOOD VI STRP
ORAL_STRIP | 0 refills | Status: DC
  Filled 2021-10-23: qty 100, 90d supply, fill #0

## 2021-10-23 MED ORDER — GLIPIZIDE ER 5 MG PO TB24
5.0000 mg | ORAL_TABLET | Freq: Every day | ORAL | 0 refills | Status: DC
  Filled 2021-10-23: qty 90, 90d supply, fill #0

## 2021-10-23 MED ORDER — FREESTYLE LANCETS MISC
0 refills | Status: DC
  Filled 2021-10-23: qty 100, 90d supply, fill #0

## 2021-10-23 NOTE — Progress Notes (Signed)
Subjective:    Patient ID: Kathy [name], female    DOB: 1975-10-09, 46 y.o.   MRN: [id number]  HPI  Patient presents to clinic today for 2-week follow-up of HTN.  At her last visit, her BP was elevated.  She is taking Amlodipine, Losartan and Spironolactone as prescribed.  Her BP today is 164/102.  ECG from 01/2015 reviewed.  Of note, we were not able to get in touch with her about her labs.  She had a stable anemia with an H&H of 11.1/35.4 but is not currently taking oral iron.  Her A1c was up to 7.1%.  She is not currently taking any oral diabetic medication at this time.  Her LDL was 76 but triglycerides 304.  She reports she is taking Simvastatin as prescribed.  Review of Systems     Past Medical History:  Diagnosis Date   Hypertension     Current Outpatient Medications  Medication Sig Dispense Refill   albuterol (VENTOLIN HFA) 108 (90 Base) MCG/ACT inhaler Inhale 2 puffs into the lungs every 6 (six) hours as needed for shortness of breath. 6.7 g 0   amLODipine (NORVASC) 10 MG tablet TAKE 1 TABLET (10 MG TOTAL) BY MOUTH DAILY. 90 tablet 1   fluticasone (FLONASE) 50 MCG/ACT nasal spray Place 2 sprays into both nostrils daily. 48 g 0   losartan (COZAAR) 50 MG tablet TAKE 1 TABLET BY MOUTH DAILY. 90 tablet 1   simvastatin (ZOCOR) 10 MG tablet Take 1 tablet (10 mg total) by mouth at bedtime. 90 tablet 1   spironolactone (ALDACTONE) 25 MG tablet TAKE 1 TABLET (25 MG TOTAL) BY MOUTH DAILY. 90 tablet 1   No current facility-administered medications for this visit.    No Known Allergies  Family History  Problem Relation Age of Onset   Hypertension Mother    Colon polyps Mother    Breast cancer Paternal Grandmother    Diabetes Maternal Aunt     Social History   Socioeconomic History   Marital status: Single    Spouse name: Not on file   Number of children: Not on file   Years of education: Not on file   Highest education level: Not on file  Occupational History    Not on file  Tobacco Use   Smoking status: Never   Smokeless tobacco: Never  Vaping Use   Vaping Use: Never used  Substance and Sexual Activity   Alcohol use: Yes    Alcohol/week: 0.0 standard drinks    Comment: rare   Drug use: No   Sexual activity: Yes    Birth control/protection: Surgical  Other Topics Concern   Not on file  Social History Narrative   Not on file   Social Determinants of Health   Financial Resource Strain: Not on file  Food Insecurity: Not on file  Transportation Needs: Not on file  Physical Activity: Not on file  Stress: Not on file  Social Connections: Not on file  Intimate Partner Violence: Not on file     Constitutional: Denies fever, malaise, fatigue, headache or abrupt weight changes.  Respiratory: Denies difficulty breathing, shortness of breath, cough or sputum production.   Cardiovascular: Denies chest pain, chest tightness, palpitations or swelling in the hands or feet.  Neurological: Denies dizziness, difficulty with memory, difficulty with speech or problems with balance and coordination.    No other specific complaints in a complete review of systems (except as listed in HPI above).  Objective:   Physical Exam  BP (!) 164/102 (BP Location: Right Arm, Patient Position: Sitting, Cuff Size: Large)   Pulse (!) 57   Temp (!) 97.1 F (36.2 C) (Temporal)   Wt 275 lb (124.7 kg)   SpO2 100%   BMI 47.20 kg/m   Wt Readings from Last 3 Encounters:  10/09/21 274 lb (124.3 kg)  05/12/21 267 lb 3.2 oz (121.2 kg)  02/25/21 268 lb (121.6 kg)    [name]: Appears her stated age, obese, in NAD. Skin: Warm, dry and intact. No ulcerations noted. HEENT: Head: normal shape and size; Eyes: sclera white, no icterus, conjunctiva pink, PERRLA and EOMs intact;  Cardiovascular: Normal rate. Pulmonary/Chest: Normal effort and positive vesicular breath sounds.  Neurological: Alert and oriented.   BMET    Component Value Date/Time   NA 137  10/09/2021 1606   K 3.6 10/09/2021 1606   CL 102 10/09/2021 1606   CO2 28 10/09/2021 1606   GLUCOSE 120 10/09/2021 1606   BUN 12 10/09/2021 1606   CREATININE 0.83 10/09/2021 1606   CALCIUM 8.9 10/09/2021 1606   GFRNONAA 108 12/04/2020 0757   GFRAA 125 12/04/2020 0757    Lipid Panel     Component Value Date/Time   CHOL 147 10/09/2021 1606   TRIG 304 (H) 10/09/2021 1606   HDL 34 (L) 10/09/2021 1606   CHOLHDL 4.3 10/09/2021 1606   VLDL 55.4 (H) 08/28/2020 1507   LDLCALC 76 10/09/2021 1606    CBC    Component Value Date/Time   WBC 5.1 10/09/2021 1606   RBC 4.63 10/09/2021 1606   HGB 11.1 (L) 10/09/2021 1606   HCT 35.4 10/09/2021 1606   PLT 263 10/09/2021 1606   MCV 76.5 (L) 10/09/2021 1606   MCH 24.0 (L) 10/09/2021 1606   MCHC 31.4 (L) 10/09/2021 1606   RDW 18.2 (H) 10/09/2021 1606    Hgb A1C Lab Results  Component Value Date   HGBA1C 7.1 (H) 10/09/2021           Assessment & Plan:    Nicki Reaper, NP

## 2021-10-23 NOTE — Patient Instructions (Signed)

## 2021-10-23 NOTE — Assessment & Plan Note (Signed)
Uncontrolled on current regimen Increase Losartan to 100 mg daily Continue Spironolactone Continue Amlodipine Reinforced DASH diet and exercise weight loss  She will go to health at work in 2 weeks for BP check and let me know the results

## 2021-10-23 NOTE — Assessment & Plan Note (Signed)
Encourage diet and exercise for weight loss 

## 2021-10-23 NOTE — Assessment & Plan Note (Signed)
We will increase Simvastatin to 20 mg daily Encouraged to consume low-fat diet

## 2021-10-23 NOTE — Assessment & Plan Note (Signed)
Rx for oral Iron 325 mg daily sent to pharmacy

## 2021-10-23 NOTE — Assessment & Plan Note (Signed)
We will start Glipizide 5 mg XL daily Reinforced low-carb diet and exercise for weight loss

## 2021-11-23 ENCOUNTER — Other Ambulatory Visit: Payer: Self-pay

## 2021-11-23 ENCOUNTER — Other Ambulatory Visit: Payer: Self-pay | Admitting: Internal Medicine

## 2021-11-24 ENCOUNTER — Other Ambulatory Visit: Payer: Self-pay

## 2021-11-24 MED ORDER — FLUTICASONE PROPIONATE 50 MCG/ACT NA SUSP
2.0000 | Freq: Every day | NASAL | 0 refills | Status: DC
Start: 2021-11-24 — End: 2023-06-03
  Filled 2021-11-24: qty 16, 30d supply, fill #0

## 2021-11-24 NOTE — Telephone Encounter (Signed)
Requested Prescriptions  Pending Prescriptions Disp Refills  . fluticasone (FLONASE) 50 MCG/ACT nasal spray 48 g 0    Sig: Place 2 sprays into both nostrils daily.     Ear, Nose, and Throat: Nasal Preparations - Corticosteroids Passed - 11/24/2021  2:53 PM      Passed - Valid encounter within last 12 months    Recent Outpatient Visits          1 month ago Primary hypertension   [facility] [place], Salvadore [place], NP   1 month ago Encounter for screening mammogram for malignant neoplasm of breast   [facility] [place], Salvadore Oxford, NP   6 months ago Acute bilateral low back pain without sciatica   [facility] [place], Salvadore Oxford, NP   9 months ago Primary hypertension   [facility] [employer], [place], NP   12 months ago Type 2 diabetes mellitus with hyperglycemia, without long-term current use of insulin [facility])   [facility], Salvadore Oxford, NP

## 2021-12-19 ENCOUNTER — Other Ambulatory Visit: Payer: Self-pay

## 2021-12-24 ENCOUNTER — Ambulatory Visit: Payer: 59 | Admitting: Pharmacist

## 2021-12-24 DIAGNOSIS — I10 Essential (primary) hypertension: Secondary | ICD-10-CM

## 2021-12-24 NOTE — Patient Instructions (Signed)
Check your blood pressure once daily, and any time you have concerning symptoms like headache, chest pain, dizziness, shortness of breath, or vision changes.   Our goal is less than 130/80.  To appropriately check your blood pressure, make sure you do the following:  1) Avoid caffeine, exercise, or tobacco products for 30 minutes before checking. Empty your bladder. 2) Sit with your back supported in a flat-backed chair. Rest your arm on something flat (arm of the chair, table, etc). 3) Sit still with your feet flat on the floor, resting, for at least 5 minutes.  4) Check your blood pressure. Take 1-2 readings.  5) Write down these readings and bring with you to any provider appointments.  Bring your home blood pressure machine with you to a provider's office for accuracy comparison at least once a year.   Make sure you take your blood pressure medications before you come to any office visit, even if you were asked to fast for labs.  Contact office at [phone] if readings persistently >130/80 or for any signs of swelling in your legs/ankles or if you have any other new or worsening symptoms.  Estelle Grumbles, PharmD, [facility] Clinical Pharmacist [facility] [phone]

## 2021-12-24 NOTE — Chronic Care Management (AMB) (Signed)
Chronic Care Management   Outreach Note  12/24/2021 Name: Kathy [name] MRN: [id number] DOB: [birth year]  I connected with Kathy [name] on 12/24/21 by telephone outreach and verified that I am speaking with the correct person using two identifiers.  Patient appearing on report for [place] Metric Hypertension Control due to last documented ambulatory blood pressure of 164/102 on 10/23/2021. No next appointment with PCP is currently scheduled   Outreached patient to discuss hypertension control and medication management.   Outpatient Encounter Medications as of 12/24/2021  Medication Sig   albuterol (VENTOLIN HFA) 108 (90 Base) MCG/ACT inhaler Inhale 2 puffs into the lungs every 6 (six) hours as needed for shortness of breath.   amLODipine (NORVASC) 10 MG tablet TAKE 1 TABLET (10 MG TOTAL) BY MOUTH DAILY.   Blood Glucose Monitoring Suppl (BLOOD GLUCOSE MONITOR SYSTEM) w/Device KIT Use to check blood sugar daily as directed.   fluticasone (FLONASE) 50 MCG/ACT nasal spray Place 2 sprays into both nostrils daily.   glipiZIDE (GLUCOTROL XL) 5 MG 24 hr tablet Take 1 tablet (5 mg total) by mouth daily with breakfast.   glucose blood test strip Use to check blood sugar daily as directed   Iron, Ferrous Sulfate, 325 (65 Fe) MG TABS Take 325 mg by mouth daily.   Lancets (FREESTYLE) lancets Use to check blood sugar daily as directed   losartan (COZAAR) 100 MG tablet Take 1 tablet (100 mg total) by mouth daily.   simvastatin (ZOCOR) 20 MG tablet Take 1 tablet (20 mg total) by mouth at bedtime.   spironolactone (ALDACTONE) 25 MG tablet TAKE 1 TABLET (25 MG TOTAL) BY MOUTH DAILY.   No facility-administered encounter medications on file as of 12/24/2021.    Lab Results  Component Value Date   CREATININE 0.83 10/09/2021   BUN 12 10/09/2021   NA 137 10/09/2021   K 3.6 10/09/2021   CL 102 10/09/2021   CO2 28 10/09/2021    BP Readings from Last 3 Encounters:  10/23/21 (!) 164/102   10/09/21 (!) 158/100  05/12/21 136/67    Pulse Readings from Last 3 Encounters:  10/23/21 (!) 57  10/09/21 76  05/12/21 71    Current medications:  amlodipine 10 mg daily losartan 100 mg daily spironolactone 25 mg daily Discuss importance of medication adherence; patient reports misses a dose of her medications less than once/month Reports takes medications directly from pill bottle, rather than using weekly pillbox Reports recently had some swelling in her legs/feet this weekend after working/being out in the heat.  Advise patient to contact office to schedule appointment with PCP regarding swelling in her legs Patient feels that swelling was due to working longer (12 hour) shifts and being in the heat over the weekend and prefers to instead contact office to schedule previously planned 3 month follow up appointment with PCP (~mid August) and discuss any swelling at that time, rather than returning sooner  Home Monitoring: Patient does not have an automated upper arm home BP machine, but reports is able to have her blood pressure checked at [facility] Encourage patient to consider obtaining an upper arm blood pressure monitor. Counsel patient on importance of using an upper arm monitor where the cuff size fits her arm Counsel on BP monitoring technique Denies having had her blood pressure checked this month Recalls blood pressure readings when checked at [facility] [employer] last month were "good", but does not have record of readings to review today  Reports sometimes adds  salt to her food Counsel patient on impact of salt/sodium on blood pressure control Encourage patient to reduce salt intake and review nutrition labels for sodium content of foods    Assessment/Plan: - Currently uncontrolled - Reviewed goal blood pressure <130/80 - Reviewed appropriate administration of medication regimen - Counseled on long term microvascular and macrovascular complications of uncontrolled  hypertension - Reviewed appropriate home BP monitoring technique (avoid caffeine, smoking, and exercise for 30 minutes before checking, rest for at least 5 minutes before taking BP, sit with feet flat on the floor and back against a hard surface, uncross legs, and rest arm on flat surface) - Reviewed to check blood pressure, document, and provide at next provider visit - Discussed dietary modifications, such as reduced salt intake - Reviewed strategies to improve medication adherence such as using weekly pillbox or daily phone alarm - Recommend patient to contact office to schedule follow up appointment with PCP. Patient to contact office to schedule sooner appointment for readings outside of established parameter, if has swelling in her legs or any other new or worsening symptoms   Follow Up Plan: CM Pharmacist will outreach to patient by telephone again on 01/07/2022 at [place], PharmD, [facility] [place] [phone]

## 2022-01-05 ENCOUNTER — Ambulatory Visit
Admission: RE | Admit: 2022-01-05 | Discharge: 2022-01-05 | Disposition: A | Discharge status: Home / Self Care | Payer: 59 | Source: Ambulatory Visit | Attending: Internal Medicine | Admitting: Internal Medicine

## 2022-01-05 DIAGNOSIS — Z1231 Encounter for screening mammogram for malignant neoplasm of breast: Secondary | ICD-10-CM | POA: Diagnosis not present

## 2022-01-07 ENCOUNTER — Telehealth: Payer: Self-pay

## 2022-01-07 ENCOUNTER — Telehealth: Payer: 59

## 2022-01-07 NOTE — Chronic Care Management (AMB) (Signed)
  Care Coordination Note  01/07/2022 Name: Kathy [name] MRN: [id number] DOB: [birth year]  Kathy [name] is a 46 y.o. year old female who is a primary care patient of Lorre Munroe, NP and is actively engaged with the care management team. I reached out to [facility] by phone today to assist with re-scheduling a follow up visit with the Pharmacist  Follow up plan: Unsuccessful telephone outreach attempt made.  The care management team will reach out to the patient again over the next 5 days.  If patient returns call to provider office, please advise to call Embedded Care Management Care Guide Penne Lash  at [phone]  Penne Lash, RMA Care Guide [facility]  [place], [place] [address] Direct Dial: [phone] [email or web address]

## 2022-01-09 ENCOUNTER — Ambulatory Visit: Payer: 59 | Admitting: Pharmacist

## 2022-01-09 DIAGNOSIS — I10 Essential (primary) hypertension: Secondary | ICD-10-CM

## 2022-01-09 NOTE — Chronic Care Management (AMB) (Signed)
Chronic Care Management   Outreach Note  01/09/2022 Name: Kathy [name] MRN: [id number] DOB: [birth year]  I connected with [name] on 01/09/22 by telephone outreach and verified that I am speaking with the correct person using two identifiers.  Patient appearing on report for [place] Metric Hypertension Control due to last documented ambulatory blood pressure of 164/102 on 10/23/2021. No next appointment with PCP is currently scheduled   Outreached patient to discuss hypertension control and medication management.   Outpatient Encounter Medications as of 01/09/2022  Medication Sig   albuterol (VENTOLIN HFA) 108 (90 Base) MCG/ACT inhaler Inhale 2 puffs into the lungs every 6 (six) hours as needed for shortness of breath.   amLODipine (NORVASC) 10 MG tablet TAKE 1 TABLET (10 MG TOTAL) BY MOUTH DAILY.   Blood Glucose Monitoring Suppl (BLOOD GLUCOSE MONITOR SYSTEM) w/Device KIT Use to check blood sugar daily as directed.   fluticasone (FLONASE) 50 MCG/ACT nasal spray Place 2 sprays into both nostrils daily.   glipiZIDE (GLUCOTROL XL) 5 MG 24 hr tablet Take 1 tablet (5 mg total) by mouth daily with breakfast.   glucose blood test strip Use to check blood sugar daily as directed   Iron, Ferrous Sulfate, 325 (65 Fe) MG TABS Take 325 mg by mouth daily.   Lancets (FREESTYLE) lancets Use to check blood sugar daily as directed   losartan (COZAAR) 100 MG tablet Take 1 tablet (100 mg total) by mouth daily.   simvastatin (ZOCOR) 20 MG tablet Take 1 tablet (20 mg total) by mouth at bedtime.   spironolactone (ALDACTONE) 25 MG tablet TAKE 1 TABLET (25 MG TOTAL) BY MOUTH DAILY.   No facility-administered encounter medications on file as of 01/09/2022.    Lab Results  Component Value Date   CREATININE 0.83 10/09/2021   BUN 12 10/09/2021   NA 137 10/09/2021   K 3.6 10/09/2021   CL 102 10/09/2021   CO2 28 10/09/2021    BP Readings from Last 3 Encounters:  10/23/21 (!) 164/102   10/09/21 (!) 158/100  05/12/21 136/67    Pulse Readings from Last 3 Encounters:  10/23/21 (!) 57  10/09/21 76  05/12/21 71    Current medications:  amlodipine 10 mg daily losartan 100 mg daily spironolactone 25 mg daily Discuss importance of medication adherence; patient denies missed doses Prefers to take medications directly from pill bottle, rather than using weekly pillbox Reports previous swelling in her legs resolved quickly. Reports she attributed swelling to standing long hours in heat and timing around her menstrual cycle     Home Monitoring: Patient does not have an automated upper arm home BP machine, but reports is able to have her blood pressure checked at [facility] Encourage patient to consider obtaining an upper arm blood pressure monitor. Note patient can obtain upper arm monitor at reduced cost through one of the employee outpatient pharmacies Have counseled patient on importance of using an upper arm monitor where the cuff size fits her arm Have counseled on BP monitoring technique Denies having checked blood pressure at work recently  Reports has cut back on adding salt/sodium to her food Counsel patient on impact of salt/sodium on blood pressure control Encourage patient to reduce salt intake and review nutrition labels for sodium content of foods   Assessment/Plan: - Currently uncontrolled - Reviewed appropriate administration of medication regimen - Counseled on long term microvascular and macrovascular complications of uncontrolled hypertension - Reviewed to check blood pressure, document, and provide at  next provider visit - Discussed dietary modifications, such as reduced salt intake - Reviewed strategies to improve medication adherence such as using weekly pillbox - Recommend patient contact office today to schedule 60-month follow up appointment with PCP.   Follow Up Plan: CM Pharmacist will outreach to patient by telephone again on 9/18 at  8:30 am  Wallace Cullens, PharmD, [facility] [place] [phone]

## 2022-01-09 NOTE — Patient Instructions (Signed)
Check your blood pressure once daily, and any time you have concerning symptoms like headache, chest pain, dizziness, shortness of breath, or vision changes.   Our goal is less than 130/80.  To appropriately check your blood pressure, make sure you do the following:  1) Avoid caffeine, exercise, or tobacco products for 30 minutes before checking. Empty your bladder. 2) Sit with your back supported in a flat-backed chair. Rest your arm on something flat (arm of the chair, table, etc). 3) Sit still with your feet flat on the floor, resting, for at least 5 minutes.  4) Check your blood pressure. Take 1-2 readings.  5) Write down these readings and bring with you to any provider appointments.  Bring your home blood pressure machine with you to a provider's office for accuracy comparison at least once a year.   Make sure you take your blood pressure medications before you come to any office visit, even if you were asked to fast for labs.  Jadia Capers Coumba Kellison, PharmD, BCACP Clinical Pharmacist [facility] [place] [phone]  

## 2022-01-29 ENCOUNTER — Other Ambulatory Visit: Payer: Self-pay

## 2022-01-29 ENCOUNTER — Ambulatory Visit (INDEPENDENT_AMBULATORY_CARE_PROVIDER_SITE_OTHER): Payer: 59 | Admitting: Internal Medicine

## 2022-01-29 ENCOUNTER — Encounter: Payer: Self-pay | Admitting: Internal Medicine

## 2022-01-29 VITALS — BP 130/76 | HR 63 | Temp 97.3°F | Wt 278.0 lb

## 2022-01-29 DIAGNOSIS — E1165 Type 2 diabetes mellitus with hyperglycemia: Secondary | ICD-10-CM

## 2022-01-29 DIAGNOSIS — E782 Mixed hyperlipidemia: Secondary | ICD-10-CM | POA: Diagnosis not present

## 2022-01-29 DIAGNOSIS — D500 Iron deficiency anemia secondary to blood loss (chronic): Secondary | ICD-10-CM | POA: Diagnosis not present

## 2022-01-29 DIAGNOSIS — I10 Essential (primary) hypertension: Secondary | ICD-10-CM

## 2022-01-29 DIAGNOSIS — Z23 Encounter for immunization: Secondary | ICD-10-CM | POA: Diagnosis not present

## 2022-01-29 LAB — POCT GLYCOSYLATED HEMOGLOBIN (HGB A1C): HbA1c, POC (controlled diabetic range): 6.8 % (ref 0.0–7.0)

## 2022-01-29 MED ORDER — LOSARTAN POTASSIUM 100 MG PO TABS
100.0000 mg | ORAL_TABLET | Freq: Every day | ORAL | 1 refills | Status: DC
  Filled 2022-01-29: qty 90, 90d supply, fill #0
  Filled 2022-05-26: qty 90, 90d supply, fill #1

## 2022-01-29 MED ORDER — GLIPIZIDE ER 5 MG PO TB24
5.0000 mg | ORAL_TABLET | Freq: Every day | ORAL | 1 refills | Status: DC
  Filled 2022-01-29: qty 90, 90d supply, fill #0
  Filled 2022-05-26: qty 90, 90d supply, fill #1

## 2022-01-29 MED ORDER — AMLODIPINE BESYLATE 10 MG PO TABS
10.0000 mg | ORAL_TABLET | Freq: Every day | ORAL | 1 refills | Status: DC
  Filled 2022-01-29: qty 90, 90d supply, fill #0
  Filled 2022-05-26: qty 90, 90d supply, fill #1

## 2022-01-29 MED ORDER — IRON (FERROUS SULFATE) 325 (65 FE) MG PO TABS
325.0000 mg | ORAL_TABLET | Freq: Every day | ORAL | 1 refills | Status: DC
  Filled 2022-01-29: qty 90, fill #0

## 2022-01-29 NOTE — Assessment & Plan Note (Signed)
POCT A1c 6.8% Urine microalbumin checked 10/2021 Encouraged her to consume a low-carb diet and exercise for weight loss Continue glipizide Encourage yearly eye exam Encourage routine foot exam Flu shot today Pneumovax UTD Encouraged her to get her COVID booster

## 2022-01-29 NOTE — Assessment & Plan Note (Signed)
Encourage diet and exercise weight loss 

## 2022-01-29 NOTE — Assessment & Plan Note (Addendum)
We will check CBC at next visit Continue oral iron

## 2022-01-29 NOTE — Progress Notes (Signed)
Subjective:    Patient ID: Kathy [name], female    DOB: 10/05/1975, 46 y.o.   MRN: [id number]  HPI  Pt presents to the clinic today for 3 month follow up of chronic conditions.  HTN: Her BP today is 130/76. She is taking Losartan, Amlodipine and Spironolactone as prescribed. ECG from 01/2015 reviewed.  HLD: Her last LDL was 76, triglycerides 304, 10/2021.  She denies myalgias on Simvastatin.  She does not consume a low-fat diet.  DM2: Her last A1c was 7.1%, 10/2021.  She does not check her sugars.  She is Glipizide taking as prescribed.  She checks her feet routinely.  Her last eye exam was 10/2021.  Flu 02/2021.  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  Arboriculturist.  Anemia: Her last H/H was 11.1/35.4, 10/2021.  She is taking oral Iron at this time.  She does not follow with hematology.  Review of Systems     Past Medical History:  Diagnosis Date   Hypertension     Current Outpatient Medications  Medication Sig Dispense Refill   albuterol (VENTOLIN HFA) 108 (90 Base) MCG/ACT inhaler Inhale 2 puffs into the lungs every 6 (six) hours as needed for shortness of breath. 6.7 g 0   amLODipine (NORVASC) 10 MG tablet TAKE 1 TABLET (10 MG TOTAL) BY MOUTH DAILY. 90 tablet 1   Blood Glucose Monitoring Suppl (BLOOD GLUCOSE MONITOR SYSTEM) w/Device KIT Use to check blood sugar daily as directed. 1 kit 0   fluticasone (FLONASE) 50 MCG/ACT nasal spray Place 2 sprays into both nostrils daily. 48 g 0   glipiZIDE (GLUCOTROL XL) 5 MG 24 hr tablet Take 1 tablet (5 mg total) by mouth daily with breakfast. 90 tablet 0   glucose blood test strip Use to check blood sugar daily as directed 100 each 0   Iron, Ferrous Sulfate, 325 (65 Fe) MG TABS Take 325 mg by mouth daily. 90 tablet 0   Lancets (FREESTYLE) lancets Use to check blood sugar daily as directed 100 each 0   losartan (COZAAR) 100 MG tablet Take 1 tablet (100 mg total) by mouth daily. 90 tablet 0   simvastatin (ZOCOR) 20 MG tablet Take 1 tablet (20 mg total)  by mouth at bedtime. 90 tablet 0   spironolactone (ALDACTONE) 25 MG tablet TAKE 1 TABLET (25 MG TOTAL) BY MOUTH DAILY. 90 tablet 1   No current facility-administered medications for this visit.    No Known Allergies  Family History  Problem Relation Age of Onset   Hypertension Mother    Colon polyps Mother    Breast cancer Paternal Grandmother    Diabetes Maternal Aunt     Social History   Socioeconomic History   Marital status: Single    Spouse name: Not on file   Number of children: Not on file   Years of education: Not on file   Highest education level: Not on file  Occupational History   Not on file  Tobacco Use   Smoking status: Never   Smokeless tobacco: Never  Vaping Use   Vaping Use: Never used  Substance and Sexual Activity   Alcohol use: Yes    Alcohol/week: 0.0 standard drinks of alcohol    Comment: rare   Drug use: No   Sexual activity: Yes    Birth control/protection: Surgical  Other Topics Concern   Not on file  Social History Narrative   Not on file   Social Determinants of Health   Financial Resource Strain: Not on  file  Food Insecurity: Not on file  Transportation Needs: Not on file  Physical Activity: Not on file  Stress: Not on file  Social Connections: Not on file  Intimate Partner Violence: Not on file     Constitutional: Patient reports fatigue.  Denies fever, malaise, headache or abrupt weight changes.  HEENT: Denies eye pain, eye redness, ear pain, ringing in the ears, wax buildup, runny nose, nasal congestion, bloody nose, or sore throat. Respiratory: Denies difficulty breathing, shortness of breath, cough or sputum production.   Cardiovascular: Patient reports swelling in legs.  Denies chest pain, chest tightness, palpitations or swelling in the hands.  Gastrointestinal: Patient reports increased thirst.  Denies abdominal pain, bloating, constipation, diarrhea or blood in the stool.  GU: Patient reports urinary frequency.  Denies  urgency, pain with urination, burning sensation, blood in urine, odor or discharge. Musculoskeletal: Denies decrease in range of motion, difficulty with gait, muscle pain or joint pain and swelling.  Skin: Denies redness, rashes, lesions or ulcercations.  Neurological: Denies dizziness, difficulty with memory, difficulty with speech or problems with balance and coordination.  Psych: Denies anxiety, depression, SI/HI.  No other specific complaints in a complete review of systems (except as listed in HPI above).  Objective:   Physical Exam  BP 130/76 (BP Location: Left Arm, Patient Position: Sitting, Cuff Size: Large)   Pulse 63   Temp (!) 97.3 F (36.3 C) (Temporal)   Wt 278 lb (126.1 kg)   SpO2 100%   BMI 47.72 kg/m   Wt Readings from Last 3 Encounters:  10/23/21 275 lb (124.7 kg)  10/09/21 274 lb (124.3 kg)  05/12/21 267 lb 3.2 oz (121.2 kg)    General: Appears her stated age, obese, in NAD. Skin: Warm, dry and intact. No ulcerations noted. HEENT: Head: normal shape and size; Eyes: sclera white, no icterus, conjunctiva pink, PERRLA and EOMs intact;  Cardiovascular: Normal rate and rhythm. S1,S2 noted.  No murmur, rubs or gallops noted.  Trace pitting  BLE edema.  Pulmonary/Chest: Normal effort and positive vesicular breath sounds. No respiratory distress. No wheezes, rales or ronchi noted.  Musculoskeletal: No difficulty with gait.  Neurological: Alert and oriented.     BMET    Component Value Date/Time   NA 137 10/09/2021 1606   K 3.6 10/09/2021 1606   CL 102 10/09/2021 1606   CO2 28 10/09/2021 1606   GLUCOSE 120 10/09/2021 1606   BUN 12 10/09/2021 1606   CREATININE 0.83 10/09/2021 1606   CALCIUM 8.9 10/09/2021 1606   GFRNONAA 108 12/04/2020 0757   GFRAA 125 12/04/2020 0757    Lipid Panel     Component Value Date/Time   CHOL 147 10/09/2021 1606   TRIG 304 (H) 10/09/2021 1606   HDL 34 (L) 10/09/2021 1606   CHOLHDL 4.3 10/09/2021 1606   VLDL 55.4 (H)  08/28/2020 1507   LDLCALC 76 10/09/2021 1606    CBC    Component Value Date/Time   WBC 5.1 10/09/2021 1606   RBC 4.63 10/09/2021 1606   HGB 11.1 (L) 10/09/2021 1606   HCT 35.4 10/09/2021 1606   PLT 263 10/09/2021 1606   MCV 76.5 (L) 10/09/2021 1606   MCH 24.0 (L) 10/09/2021 1606   MCHC 31.4 (L) 10/09/2021 1606   RDW 18.2 (H) 10/09/2021 1606    Hgb A1C Lab Results  Component Value Date   HGBA1C 7.1 (H) 10/09/2021           Assessment & Plan:   RTC in  6 months, follow-up chronic conditions Webb Silversmith, NP

## 2022-01-29 NOTE — Assessment & Plan Note (Signed)
C-Met and lipid profile today Urged her to consume a low-fat diet Continue simvastatin Consider adding ezetimibe if triglycerides remain elevated

## 2022-01-29 NOTE — Assessment & Plan Note (Signed)
Controlled on losartan, amlodipine and spironolactone We will consider increasing spironolactone given her edema based off c-Met Will need prescription renewals today Reinforced DASH diet and exercise for weight loss

## 2022-01-29 NOTE — Patient Instructions (Signed)

## 2022-01-30 LAB — COMPLETE METABOLIC PANEL WITH GFR
AG Ratio: 1 (calc) (ref 1.0–2.5)
ALT: 14 U/L (ref 6–29)
AST: 14 U/L (ref 10–30)
Albumin: 3.6 g/dL (ref 3.6–5.1)
Alkaline phosphatase (APISO): 75 U/L (ref 31–125)
BUN: 11 mg/dL (ref 7–25)
CO2: 21 mmol/L (ref 20–32)
Calcium: 9.2 mg/dL (ref 8.6–10.2)
Chloride: 103 mmol/L (ref 98–110)
Creat: 0.88 mg/dL (ref 0.50–0.99)
Globulin: 3.7 g/dL (calc) (ref 1.9–3.7)
Glucose, Bld: 121 mg/dL — ABNORMAL HIGH (ref 65–99)
Potassium: 3.9 mmol/L (ref 3.5–5.3)
Sodium: 135 mmol/L (ref 135–146)
Total Bilirubin: 0.7 mg/dL (ref 0.2–1.2)
Total Protein: 7.3 g/dL (ref 6.1–8.1)
eGFR: 83 mL/min/{1.73_m2} (ref >=60)

## 2022-01-30 LAB — LIPID PANEL
Cholesterol: 125 mg/dL (ref <200)
HDL: 35 mg/dL — ABNORMAL LOW (ref >=50)
LDL Cholesterol (Calc): 69 mg/dL (calc)
Non-HDL Cholesterol (Calc): 90 mg/dL (calc) (ref <130)
Total CHOL/HDL Ratio: 3.6 (calc) (ref <5.0)
Triglycerides: 126 mg/dL (ref <150)

## 2022-02-03 ENCOUNTER — Other Ambulatory Visit: Payer: Self-pay

## 2022-02-23 ENCOUNTER — Ambulatory Visit: Payer: 59 | Admitting: Pharmacist

## 2022-02-23 ENCOUNTER — Other Ambulatory Visit: Payer: Self-pay

## 2022-02-23 ENCOUNTER — Other Ambulatory Visit: Payer: Self-pay | Admitting: Internal Medicine

## 2022-02-23 ENCOUNTER — Telehealth: Payer: Self-pay | Admitting: Pharmacist

## 2022-02-23 DIAGNOSIS — I10 Essential (primary) hypertension: Secondary | ICD-10-CM

## 2022-02-23 MED ORDER — SIMVASTATIN 20 MG PO TABS
20.0000 mg | ORAL_TABLET | Freq: Every day | ORAL | 0 refills | Status: DC
  Filled 2022-02-23 – 2022-07-13 (×2): qty 90, 90d supply, fill #0

## 2022-02-23 NOTE — Patient Instructions (Signed)
Check your blood pressure twice weekly, and any time you have concerning symptoms like headache, chest pain, dizziness, shortness of breath, or vision changes.   Our goal is less than 130/80.  To appropriately check your blood pressure, make sure you do the following:  1) Avoid caffeine, exercise, or tobacco products for 30 minutes before checking. Empty your bladder. 2) Sit with your back supported in a flat-backed chair. Rest your arm on something flat (arm of the chair, table, etc). 3) Sit still with your feet flat on the floor, resting, for at least 5 minutes.  4) Check your blood pressure. Take 1-2 readings.  5) Write down these readings and bring with you to any provider appointments.  Bring your home blood pressure machine with you to a provider's office for accuracy comparison at least once a year.   Make sure you take your blood pressure medications before you come to any office visit, even if you were asked to fast for labs.  Kathy [name] Valisa Karpel, PharmD, BCACP Clinical Pharmacist [facility] [place] [phone]  

## 2022-02-23 NOTE — Telephone Encounter (Signed)
Would you please send a Rx for simvastatin 20 mg daily to [facility] for patient as current Rx is out of refills?  Thank you!  Wallace Cullens, PharmD, [place] [phone]

## 2022-02-23 NOTE — Chronic Care Management (AMB) (Signed)
Chronic Care Management   Outreach Note  02/23/2022 Name: Kathy [name] MRN: [id number] DOB: 10-19-1975  I connected with Kathy [name] on 02/23/22 by telephone outreach and verified that I am speaking with the correct person using two identifiers.  Patient appearing on report for [place] Metric Hypertension Control due to previously documented ambulatory blood pressure of 164/102 on 10/23/2021.   From review of chart, note patient seen by PCP on 01/29/2022 and BP 130/76 at office visit.  Outreached patient to discuss hypertension control and medication management.   Outpatient Encounter Medications as of 02/23/2022  Medication Sig   amLODipine (NORVASC) 10 MG tablet TAKE 1 TABLET (10 MG TOTAL) BY MOUTH DAILY.   Blood Glucose Monitoring Suppl (BLOOD GLUCOSE MONITOR SYSTEM) w/Device KIT Use to check blood sugar daily as directed.   fluticasone (FLONASE) 50 MCG/ACT nasal spray Place 2 sprays into both nostrils daily.   glipiZIDE (GLUCOTROL XL) 5 MG 24 hr tablet Take 1 tablet (5 mg total) by mouth daily with breakfast.   glucose blood test strip Use to check blood sugar daily as directed   Iron, Ferrous Sulfate, 325 (65 Fe) MG TABS Take 325 mg by mouth daily.   Lancets (FREESTYLE) lancets Use to check blood sugar daily as directed   losartan (COZAAR) 100 MG tablet Take 1 tablet (100 mg total) by mouth daily.   simvastatin (ZOCOR) 20 MG tablet Take 1 tablet (20 mg total) by mouth at bedtime.   spironolactone (ALDACTONE) 25 MG tablet TAKE 1 TABLET (25 MG TOTAL) BY MOUTH DAILY.   No facility-administered encounter medications on file as of 02/23/2022.    Lab Results  Component Value Date   CREATININE 0.88 01/29/2022   BUN 11 01/29/2022   NA 135 01/29/2022   K 3.9 01/29/2022   CL 103 01/29/2022   CO2 21 01/29/2022    BP Readings from Last 3 Encounters:  01/29/22 130/76  10/23/21 (!) 164/102  10/09/21 (!) 158/100    Pulse Readings from Last 3 Encounters:  01/29/22  63  10/23/21 (!) 57  10/09/21 76    Current medications:  amlodipine 10 mg daily losartan 100 mg daily spironolactone 25 mg daily      Home Monitoring: Patient does not have an automated upper arm home BP machine, but reports is able to have her blood pressure checked at [employer] [facility] [employer] Encourage patient to consider obtaining an upper arm blood pressure monitor. Note patient can obtain upper arm monitor at reduced cost through one of the employee outpatient pharmacies Have counseled patient on importance of using an upper arm monitor where the cuff size fits her arm Have counseled on BP monitoring technique Denies having checked blood pressure at work recently   Reports has cut back on adding salt/sodium to her food Counsel patient on impact of salt/sodium on blood pressure control Encourage patient to reduce salt intake and review nutrition labels for sodium content of foods  Exercise: denies exercising recently, but reports will start walking. Encourage patient to start walking most days/week and slowly increase exercise  Assessment/Plan: - Currently controlled - Reviewed goal blood pressure <130/80 - Encourage patient to obtain a home upper arm BP monitor in order to check blood pressure, document, and provide at next provider visit - Discussed dietary modifications, such as reduced salt intake - Discussed goal of 150 minutes of moderate intensity physical activity weekly - Will collaborate with PCP to request new Rx for simvastatin be sent to pharmacy for patient as  current Rx is out of refills   Follow Up Plan: -Patient denies further medication questions or concerns today -Provide patient with contact information for clinic pharmacist to contact if needed in future for medication questions/concerns   Wallace Cullens, PharmD, [place] [phone]

## 2022-03-09 ENCOUNTER — Other Ambulatory Visit: Payer: Self-pay

## 2022-05-26 ENCOUNTER — Other Ambulatory Visit: Payer: Self-pay

## 2022-05-28 ENCOUNTER — Other Ambulatory Visit: Payer: Self-pay | Admitting: Internal Medicine

## 2022-05-28 ENCOUNTER — Other Ambulatory Visit: Payer: Self-pay

## 2022-05-29 ENCOUNTER — Other Ambulatory Visit: Payer: Self-pay

## 2022-05-29 MED FILL — Spironolactone Tab 25 MG: ORAL | 90 days supply | Qty: 90 | Fill #0 | Status: CN

## 2022-05-29 NOTE — Telephone Encounter (Signed)
Requested Prescriptions  Pending Prescriptions Disp Refills   spironolactone (ALDACTONE) 25 MG tablet 90 tablet 0    Sig: TAKE 1 TABLET (25 MG TOTAL) BY MOUTH DAILY.     Cardiovascular: Diuretics - Aldosterone Antagonist Passed - 05/28/2022  3:44 PM      Passed - Cr in normal range and within 180 days    Creat  Date Value Ref Range Status  01/29/2022 0.88 0.50 - 0.99 mg/dL Final   Creatinine, Urine  Date Value Ref Range Status  10/09/2021 388 (H) 20 - 275 mg/dL Final         Passed - K in normal range and within 180 days    Potassium  Date Value Ref Range Status  01/29/2022 3.9 3.5 - 5.3 mmol/L Final         Passed - Na in normal range and within 180 days    Sodium  Date Value Ref Range Status  01/29/2022 135 135 - 146 mmol/L Final         Passed - eGFR is 30 or above and within 180 days    GFR, Est African American  Date Value Ref Range Status  12/04/2020 125 > OR = 60 mL/min/1.78m Final   GFR, Est Non African American  Date Value Ref Range Status  12/04/2020 108 > OR = 60 mL/min/1.726mFinal   GFR  Date Value Ref Range Status  09/17/2020 106.57 >60.00 mL/min Final    Comment:    Calculated using the CKD-EPI Creatinine Equation (2021)   eGFR  Date Value Ref Range Status  01/29/2022 83 > OR = 60 mL/min/1.7355minal         Passed - Last BP in normal range    BP Readings from Last 1 Encounters:  01/29/22 130/76         Passed - Valid encounter within last 6 months    Recent Outpatient Visits           3 months ago Primary hypertension   [place] Ormond RPH-CPP   4 months ago Type 2 diabetes mellitus with hyperglycemia, without long-term current use of insulin [facility]   4 months ago Primary hypertension   [place] DiamondPH-CPP   5 months ago Primary hypertension   [place] DiamondPH-CPP   7 months ago  Primary hypertension   [facility]

## 2022-06-02 ENCOUNTER — Other Ambulatory Visit: Payer: Self-pay

## 2022-06-07 ENCOUNTER — Other Ambulatory Visit: Payer: Self-pay

## 2022-07-13 ENCOUNTER — Other Ambulatory Visit: Payer: Self-pay

## 2022-07-13 IMAGING — CR DG CHEST 2V
1 series · 2 of 2 positions shown · non-contrast
Comparison: None available.

CLINICAL DATA: Sore throat with cough, headache and congestion.

EXAM:
CHEST - 2 VIEW

[Series 1: dg chest 2 view · 0.14mm/px · 2 of 2 slices shown]
[im 1/2]
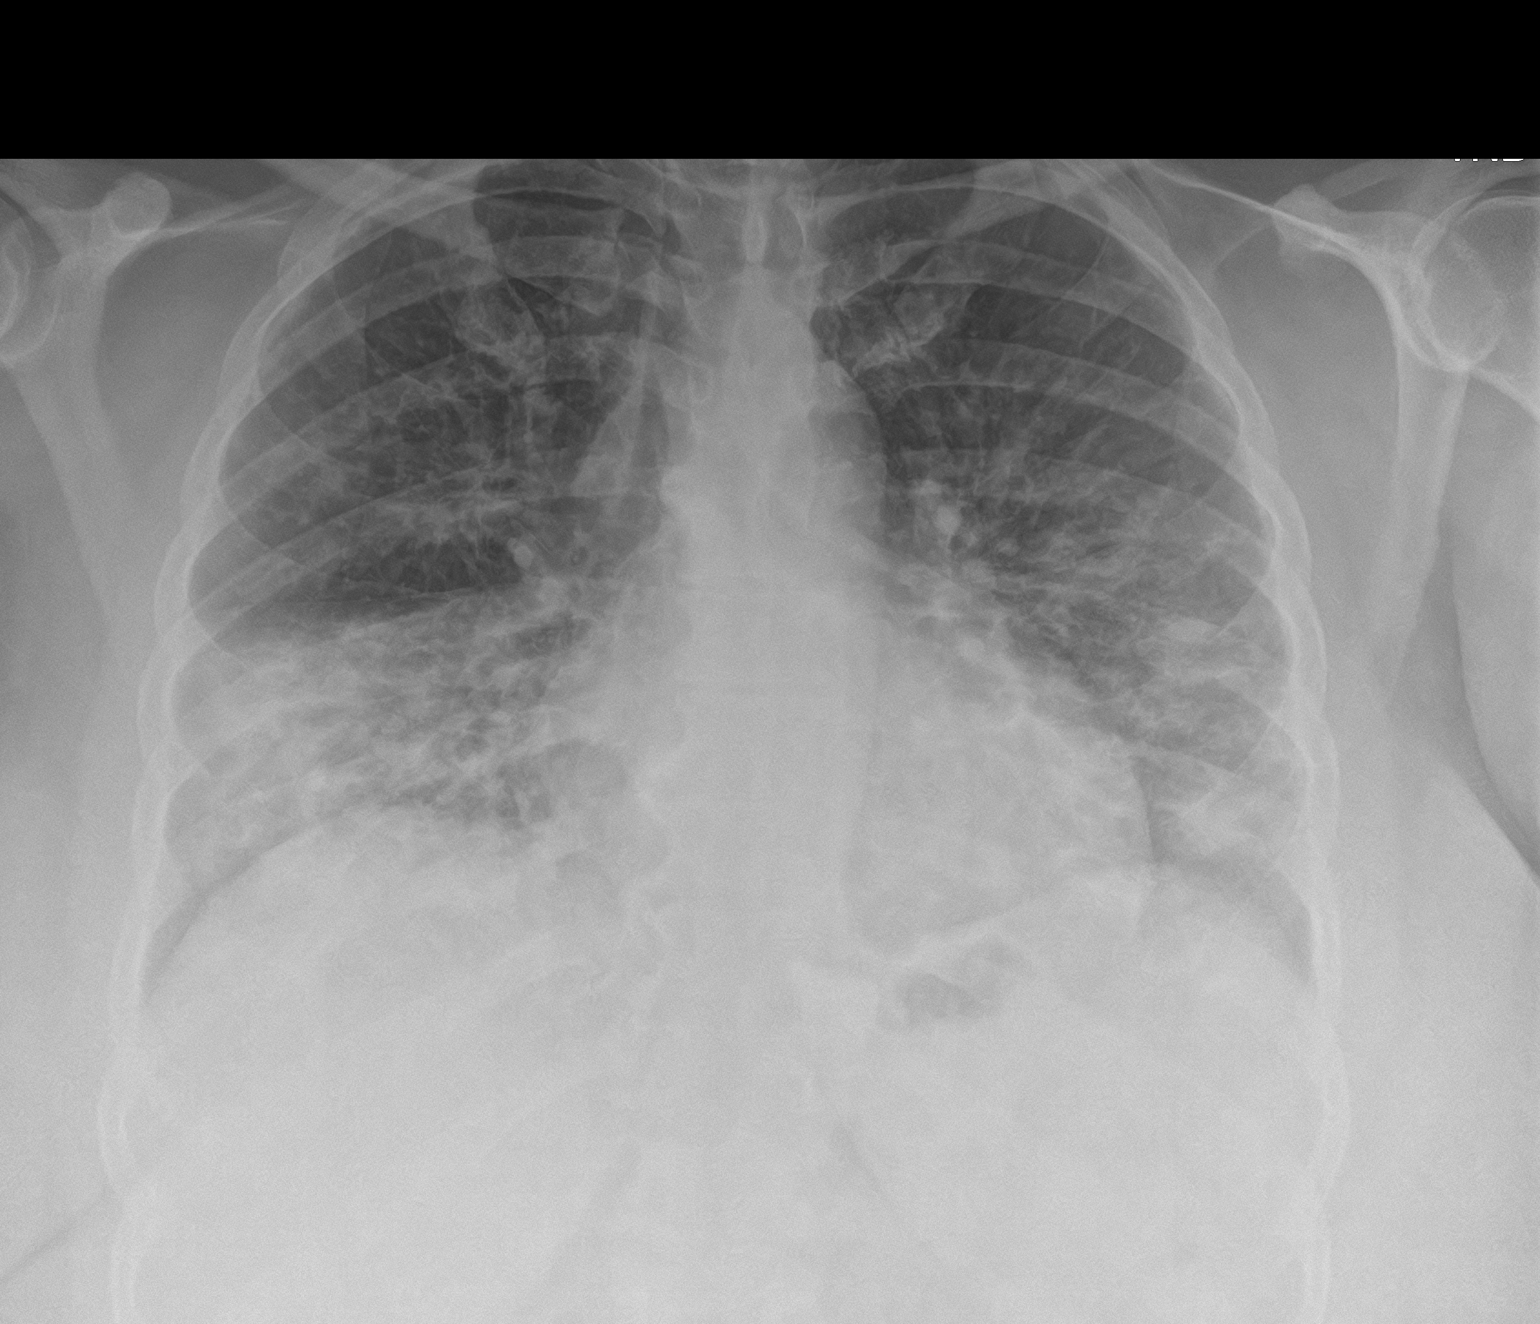
[im 2/2]
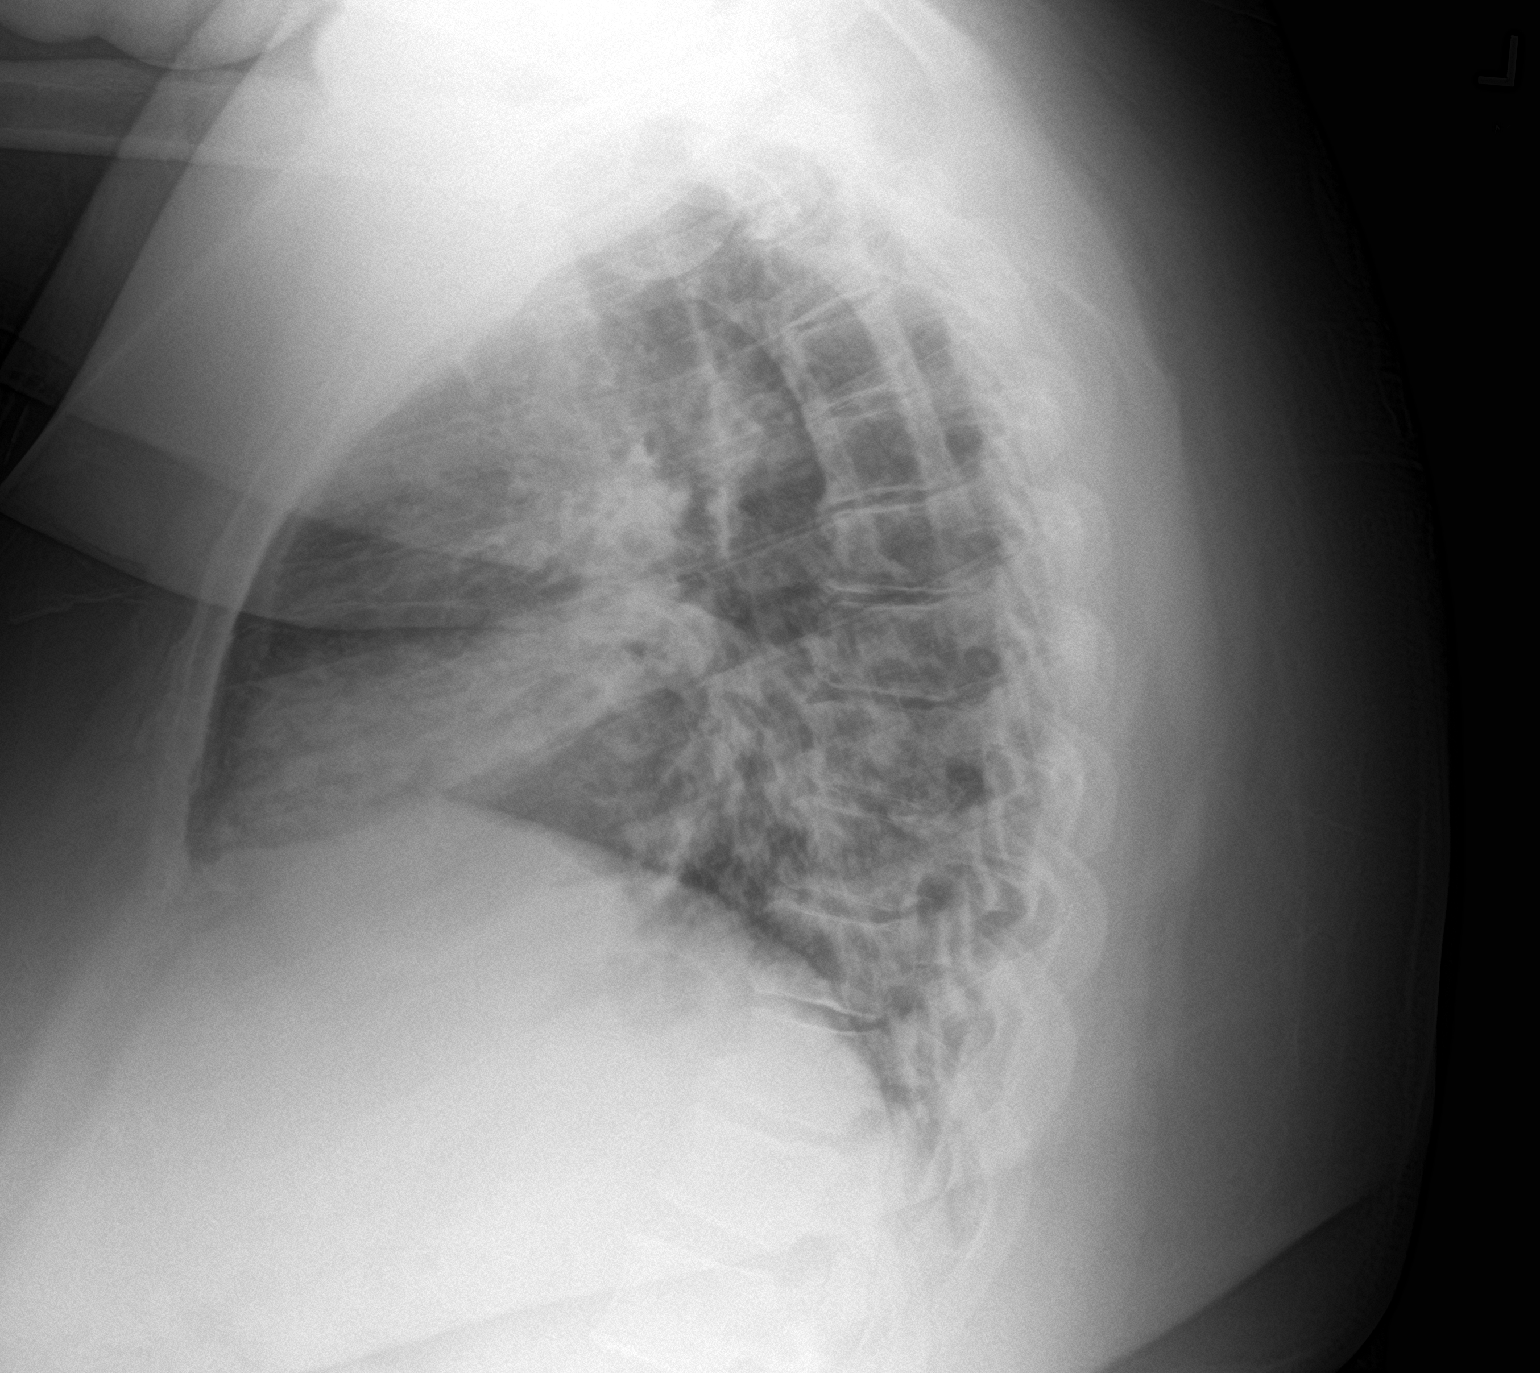

[2 of 2 positions shown; findings below may reference images not displayed]

FINDINGS: The heart size and mediastinal contours are within normal limits,
without definite adenopathy. There are patchy airspace opacities at
both lung bases with an asymmetric component in the right middle
lobe suspicious for multilobar pneumonia. There is no pneumothorax
or significant pleural effusion. The bones appear unremarkable.
IMPRESSION: Patchy bibasilar airspace opacities suspicious for multilobar
pneumonia. Followup PA and lateral chest X-ray is recommended in 3-4
weeks following trial of antibiotic therapy to ensure resolution and
exclude underlying malignancy.

## 2022-07-16 ENCOUNTER — Other Ambulatory Visit: Payer: Self-pay

## 2022-07-16 MED FILL — Spironolactone Tab 25 MG: ORAL | 90 days supply | Qty: 90 | Fill #0 | Status: AC

## 2022-08-31 ENCOUNTER — Other Ambulatory Visit: Payer: Self-pay

## 2022-08-31 ENCOUNTER — Other Ambulatory Visit: Payer: Self-pay | Admitting: Internal Medicine

## 2022-08-31 ENCOUNTER — Other Ambulatory Visit (HOSPITAL_COMMUNITY): Payer: Self-pay

## 2022-09-01 ENCOUNTER — Other Ambulatory Visit: Payer: Self-pay

## 2022-09-01 MED ORDER — AMLODIPINE BESYLATE 10 MG PO TABS
10.0000 mg | ORAL_TABLET | Freq: Every day | ORAL | 0 refills | Status: DC
  Filled 2022-09-01: qty 30, 30d supply, fill #0

## 2022-09-01 MED ORDER — LOSARTAN POTASSIUM 100 MG PO TABS
100.0000 mg | ORAL_TABLET | Freq: Every day | ORAL | 0 refills | Status: DC
  Filled 2022-09-01: qty 30, 30d supply, fill #0

## 2022-09-01 MED ORDER — GLIPIZIDE ER 5 MG PO TB24
5.0000 mg | ORAL_TABLET | Freq: Every day | ORAL | 0 refills | Status: DC
  Filled 2022-09-01: qty 30, 30d supply, fill #0

## 2022-09-01 NOTE — Telephone Encounter (Signed)
Attempted to call patient to schedule appointment- left message to call office. Courtesy 30 day Rx sent to pharmacy Requested Prescriptions  Pending Prescriptions Disp Refills   amLODipine (NORVASC) 10 MG tablet 90 tablet 1    Sig: TAKE 1 TABLET (10 MG TOTAL) BY MOUTH DAILY.     Cardiovascular: Calcium Channel Blockers 2 Failed - 08/31/2022 12:02 PM      Failed - Valid encounter within last 6 months    Recent Outpatient Visits           6 months ago Primary hypertension   [place], Grayland Ormond A, RPH-CPP   7 months ago Type 2 diabetes mellitus with hyperglycemia, without long-term current use of insulin [facility])   [facility] [place], Coralie Keens, NP   7 months ago Primary hypertension   [facility] D[facility], Grayland Ormond A, RPH-CPP   8 months ago Primary hypertension   [facility] Delles, Grayland Ormond A, RPH-CPP   10 months ago Primary hypertension   [facility] [place], [place] W, NP              Passed - Last BP in normal range    BP Readings from Last 1 Encounters:  01/29/22 130/76         Passed - Last Heart Rate in normal range    Pulse Readings from Last 1 Encounters:  01/29/22 63          glipiZIDE (GLUCOTROL XL) 5 MG 24 hr tablet 90 tablet 1    Sig: Take 1 tablet (5 mg total) by mouth daily with breakfast.     Endocrinology:  Diabetes - Sulfonylureas Failed - 08/31/2022 12:02 PM      Failed - HBA1C is between 0 and 7.9 and within 180 days    HbA1c, POC (controlled diabetic range)  Date Value Ref Range Status  01/29/2022 6.8 0.0 - 7.0 % Final         Failed - Valid encounter within last 6 months    Recent Outpatient Visits           6 months ago Primary hypertension   [place], Grayland Ormond A, RPH-CPP   7 months ago Type 2 diabetes mellitus with hyperglycemia, without long-term current use of  insulin [facility])   [facility] [place], [place] W, NP   7 months ago Primary hypertension   [facility] Delles, Grayland Ormond A, RPH-CPP   8 months ago Primary hypertension   [facility] Delles, Grayland Ormond A, RPH-CPP   10 months ago Primary hypertension   [facility] [place], [place] W, [place]              Passed - Cr in normal range and within 360 days    Creat  Date Value Ref Range Status  01/29/2022 0.88 0.50 - 0.99 mg/dL Final   Creatinine, Urine  Date Value Ref Range Status  10/09/2021 388 (H) 20 - 275 mg/dL Final          losartan (COZAAR) 100 MG tablet 90 tablet 1    Sig: Take 1 tablet (100 mg total) by mouth daily.     Cardiovascular:  Angiotensin Receptor Blockers Failed - 08/31/2022 12:02 PM      Failed - Cr in normal range and within 180 days  Creat  Date Value Ref Range Status  01/29/2022 0.88 0.50 - 0.99 mg/dL Final   Creatinine, Urine  Date Value Ref Range Status  10/09/2021 388 (H) 20 - 275 mg/dL Final         Failed - K in normal range and within 180 days    Potassium  Date Value Ref Range Status  01/29/2022 3.9 3.5 - 5.3 mmol/L Final         Failed - Valid encounter within last 6 months    Recent Outpatient Visits           6 months ago Primary hypertension   [place], Grayland Ormond A, RPH-CPP   7 months ago Type 2 diabetes mellitus with hyperglycemia, without long-term current use of insulin [facility])   [facility] [place], Coralie Keens, NP   7 months ago Primary hypertension   [place], Grayland Ormond A, RPH-CPP   8 months ago Primary hypertension   [facility] Delles, Grayland Ormond A, RPH-CPP   10 months ago Primary hypertension   [place], [place]              Passed - Patient is not  pregnant      Passed - Last BP in normal range    BP Readings from Last 1 Encounters:  01/29/22 130/76

## 2022-09-08 ENCOUNTER — Other Ambulatory Visit: Payer: Self-pay | Admitting: Internal Medicine

## 2022-09-08 ENCOUNTER — Other Ambulatory Visit: Payer: Self-pay

## 2022-09-08 NOTE — Telephone Encounter (Signed)
Requested Prescriptions  Refused Prescriptions Disp Refills   albuterol (VENTOLIN HFA) 108 (90 Base) MCG/ACT inhaler 18 g 0    Sig: INHALE 2 PUFFS BY MOUTH EVERY 6 HOURS AS NEEDED FOR SHORTNESS OF BREATH.     Pulmonology:  Beta Agonists 2 Passed - 09/08/2022  1:39 PM      Passed - Last BP in normal range    BP Readings from Last 1 Encounters:  01/29/22 130/76         Passed - Last Heart Rate in normal range    Pulse Readings from Last 1 Encounters:  01/29/22 63         Passed - Valid encounter within last 12 months    Recent Outpatient Visits           6 months ago Primary hypertension   [place], Grayland Ormond A, RPH-CPP   7 months ago Type 2 diabetes mellitus with hyperglycemia, without long-term current use of insulin [facility])   [facility] [place], Coralie Keens, NP   8 months ago Primary hypertension   [place], Grayland Ormond A, RPH-CPP   8 months ago Primary hypertension   [facility] Delles, Grayland Ormond A, RPH-CPP   10 months ago Primary hypertension   [facility] [place], Coralie Keens, [place]

## 2022-10-07 ENCOUNTER — Other Ambulatory Visit: Payer: Self-pay | Admitting: Internal Medicine

## 2022-10-07 ENCOUNTER — Other Ambulatory Visit: Payer: Self-pay

## 2022-10-08 ENCOUNTER — Other Ambulatory Visit: Payer: Self-pay

## 2022-10-08 NOTE — Telephone Encounter (Signed)
Unable to refill per protocol, Rx expired. Discontinued 10/09/21.  Requested Prescriptions  Pending Prescriptions Disp Refills   albuterol (VENTOLIN HFA) 108 (90 Base) MCG/ACT inhaler 18 g 0    Sig: INHALE 2 PUFFS BY MOUTH EVERY 6 HOURS AS NEEDED FOR SHORTNESS OF BREATH.     Pulmonology:  Beta Agonists 2 Passed - 10/07/2022 12:08 PM      Passed - Last BP in normal range    BP Readings from Last 1 Encounters:  01/29/22 130/76         Passed - Last Heart Rate in normal range    Pulse Readings from Last 1 Encounters:  01/29/22 63         Passed - Valid encounter within last 12 months    Recent Outpatient Visits           7 months ago Primary hypertension   [place] [facility] Delles, Gentry Fitz A, RPH-CPP   8 months ago Type 2 diabetes mellitus with hyperglycemia, without long-term current use of insulin [facility])   [place] [facility] [place], Salvadore [place], NP   9 months ago Primary hypertension   [place] [facility] Delles, Gentry Fitz A, RPH-CPP   9 months ago Primary hypertension   [place] [facility] Delles, Jackelyn [place], RPH-CPP   11 months ago Primary hypertension   [place] [facility] [place], Salvadore [place], [place]

## 2022-11-26 ENCOUNTER — Ambulatory Visit (INDEPENDENT_AMBULATORY_CARE_PROVIDER_SITE_OTHER): Payer: 59 | Admitting: Internal Medicine

## 2022-11-26 ENCOUNTER — Other Ambulatory Visit (HOSPITAL_COMMUNITY)
Admission: RE | Admit: 2022-11-26 | Discharge: 2022-11-26 | Disposition: A | Discharge status: Home / Self Care | Payer: 59 | Source: Ambulatory Visit | Attending: Internal Medicine | Admitting: Internal Medicine

## 2022-11-26 ENCOUNTER — Telehealth: Payer: Self-pay

## 2022-11-26 ENCOUNTER — Encounter: Payer: Self-pay | Admitting: Internal Medicine

## 2022-11-26 ENCOUNTER — Other Ambulatory Visit: Payer: Self-pay

## 2022-11-26 VITALS — BP 116/84 | HR 62 | Temp 96.6°F | Ht 65.0 in | Wt 270.0 lb

## 2022-11-26 DIAGNOSIS — Z124 Encounter for screening for malignant neoplasm of cervix: Secondary | ICD-10-CM | POA: Insufficient documentation

## 2022-11-26 DIAGNOSIS — Z1231 Encounter for screening mammogram for malignant neoplasm of breast: Secondary | ICD-10-CM | POA: Diagnosis not present

## 2022-11-26 DIAGNOSIS — E1165 Type 2 diabetes mellitus with hyperglycemia: Secondary | ICD-10-CM

## 2022-11-26 DIAGNOSIS — Z1211 Encounter for screening for malignant neoplasm of colon: Secondary | ICD-10-CM

## 2022-11-26 DIAGNOSIS — Z6841 Body Mass Index (BMI) 40.0 and over, adult: Secondary | ICD-10-CM | POA: Diagnosis not present

## 2022-11-26 DIAGNOSIS — Z0001 Encounter for general adult medical examination with abnormal findings: Secondary | ICD-10-CM

## 2022-11-26 MED ORDER — TIRZEPATIDE 2.5 MG/0.5ML ~~LOC~~ SOAJ
2.5000 mg | SUBCUTANEOUS | 0 refills | Status: DC
  Filled 2022-11-26: qty 2, 28d supply, fill #0

## 2022-11-26 MED ORDER — NA SULFATE-K SULFATE-MG SULF 17.5-3.13-1.6 GM/177ML PO SOLN
1.0000 | Freq: Once | ORAL | 0 refills | Status: AC
  Filled 2022-11-26: qty 354, 1d supply, fill #0

## 2022-11-26 NOTE — Patient Instructions (Signed)

## 2022-11-26 NOTE — Assessment & Plan Note (Signed)
Encourage diet and exercise for weight loss Will trial Mounjaro 2.5 mg weekly 

## 2022-11-26 NOTE — Assessment & Plan Note (Signed)
Will trial Mounjaro 2.5 mg weekly, has failed metformin in the past

## 2022-11-26 NOTE — Telephone Encounter (Signed)
Gastroenterology Pre-Procedure Review  Request Date: 12/01/22 Requesting Physician: Dr. Servando Snare  PATIENT REVIEW QUESTIONS: The patient responded to the following health history questions as indicated:    1. Are you having any GI issues? no 2. Do you have a personal history of Polyps? no 3. Do you have a family history of Colon Cancer or Polyps? yes (mother colon polyps) 4. Diabetes Mellitus? yes (just received rx for Greggory Keen has been advised to wait to start after colonoscopy.  She takes Glipizide and has been advised to stop on 11/30/22) 5. Joint replacements in the past 12 months?no 6. Major health problems in the past 3 months?no 7. Any artificial heart valves, MVP, or defibrillator?no    MEDICATIONS & ALLERGIES:    Patient reports the following regarding taking any anticoagulation/antiplatelet therapy:   Plavix, Coumadin, Eliquis, Xarelto, Lovenox, Pradaxa, Brilinta, or Effient? no Aspirin? no  Patient confirms/reports the following medications:  Current Outpatient Medications  Medication Sig Dispense Refill   amLODipine (NORVASC) 10 MG tablet Take 1 tablet (10 mg total) by mouth daily. 30 tablet 0   Blood Glucose Monitoring Suppl (BLOOD GLUCOSE MONITOR SYSTEM) w/Device KIT Use to check blood sugar daily as directed. 1 kit 0   fluticasone (FLONASE) 50 MCG/ACT nasal spray Place 2 sprays into both nostrils daily. 48 g 0   glipiZIDE (GLUCOTROL XL) 5 MG 24 hr tablet Take 1 tablet (5 mg total) by mouth daily with breakfast. 30 tablet 0   glucose blood test strip Use to check blood sugar daily as directed 100 each 0   Iron, Ferrous Sulfate, 325 (65 Fe) MG TABS Take 325 mg by mouth daily. 90 tablet 1   Lancets (FREESTYLE) lancets Use to check blood sugar daily as directed 100 each 0   losartan (COZAAR) 100 MG tablet Take 1 tablet (100 mg total) by mouth daily. 30 tablet 0   simvastatin (ZOCOR) 20 MG tablet Take 1 tablet (20 mg total) by mouth at bedtime. 90 tablet 0   spironolactone  (ALDACTONE) 25 MG tablet TAKE 1 TABLET (25 MG TOTAL) BY MOUTH DAILY. 90 tablet 0   tirzepatide (MOUNJARO) 2.5 MG/0.5ML Pen Inject 2.5 mg into the skin once a week. 2 mL 0   No current facility-administered medications for this visit.    Patient confirms/reports the following allergies:  No Known Allergies  No orders of the defined types were placed in this encounter.   AUTHORIZATION INFORMATION Primary Insurance: 1D#: Group #:  Secondary Insurance: 1D#: Group #:  SCHEDULE INFORMATION: Date: 12/01/22 Time: Location: [facility]

## 2022-11-26 NOTE — Progress Notes (Signed)
Subjective:    Patient ID: Kathy [name], female    DOB: 1975-12-30, 47 y.o.   MRN: [id number]  HPI  Patient presents to clinic today for her annual exam.  Flu: 01/2022 Tetanus: 04/2016 COVID: Pfizer x 2 Pneumovax: 10/2019 Pap smear: 09/2017 Mammogram: 01/2022 Colon screening: Never Vision screening: as needed Dentist: biannually  Diet: She does eat lean meat. She eats some fruits and veggies. She does eat some fried foods. She drinks mostly water and sweet tea. Exercise: Walking  Review of Systems     Past Medical History:  Diagnosis Date   Hypertension     Current Outpatient Medications  Medication Sig Dispense Refill   amLODipine (NORVASC) 10 MG tablet Take 1 tablet (10 mg total) by mouth daily. 30 tablet 0   Blood Glucose Monitoring Suppl (BLOOD GLUCOSE MONITOR SYSTEM) w/Device KIT Use to check blood sugar daily as directed. 1 kit 0   fluticasone (FLONASE) 50 MCG/ACT nasal spray Place 2 sprays into both nostrils daily. 48 g 0   glipiZIDE (GLUCOTROL XL) 5 MG 24 hr tablet Take 1 tablet (5 mg total) by mouth daily with breakfast. 30 tablet 0   glucose blood test strip Use to check blood sugar daily as directed 100 each 0   Iron, Ferrous Sulfate, 325 (65 Fe) MG TABS Take 325 mg by mouth daily. 90 tablet 1   Lancets (FREESTYLE) lancets Use to check blood sugar daily as directed 100 each 0   losartan (COZAAR) 100 MG tablet Take 1 tablet (100 mg total) by mouth daily. 30 tablet 0   simvastatin (ZOCOR) 20 MG tablet Take 1 tablet (20 mg total) by mouth at bedtime. 90 tablet 0   spironolactone (ALDACTONE) 25 MG tablet TAKE 1 TABLET (25 MG TOTAL) BY MOUTH DAILY. 90 tablet 0   No current facility-administered medications for this visit.    No Known Allergies  Family History  Problem Relation Age of Onset   Hypertension Mother    Colon polyps Mother    Breast cancer Paternal Grandmother    Diabetes Maternal Aunt     Social History   Socioeconomic History    Marital status: Single    Spouse name: Not on file   Number of children: Not on file   Years of education: Not on file   Highest education level: Not on file  Occupational History   Not on file  Tobacco Use   Smoking status: Never   Smokeless tobacco: Never  Vaping Use   Vaping Use: Never used  Substance and Sexual Activity   Alcohol use: Yes    Alcohol/week: 0.0 standard drinks of alcohol    Comment: rare   Drug use: No   Sexual activity: Yes    Birth control/protection: Surgical  Other Topics Concern   Not on file  Social History Narrative   Not on file   Social Determinants of Health   Financial Resource Strain: Not on file  Food Insecurity: Not on file  Transportation Needs: Not on file  Physical Activity: Not on file  Stress: Not on file  Social Connections: Not on file  Intimate Partner Violence: Not on file     Constitutional: Denies fever, malaise, fatigue, headache or abrupt weight changes.  HEENT: Denies eye pain, eye redness, ear pain, ringing in the ears, wax buildup, runny nose, nasal congestion, bloody nose, or sore throat. Respiratory: Denies difficulty breathing, shortness of breath, cough or sputum production.   Cardiovascular: Denies chest pain, chest tightness,  palpitations or swelling in the hands or feet.  Gastrointestinal: Denies abdominal pain, bloating, constipation, diarrhea or blood in the stool.  GU: Denies urgency, frequency, pain with urination, burning sensation, blood in urine, odor or discharge. Musculoskeletal: Denies decrease in range of motion, difficulty with gait, muscle pain or joint pain and swelling.  Skin: Denies redness, rashes, lesions or ulcercations.  Neurological: Denies dizziness, difficulty with memory, difficulty with speech or problems with balance and coordination.  Psych: Denies anxiety, depression, SI/HI.  No other specific complaints in a complete review of systems (except as listed in HPI above).  Objective:    Physical Exam   BP 116/84 (BP Location: Right Arm, Patient Position: Sitting, Cuff Size: Large)   Pulse 62   Temp (!) 96.6 F (35.9 C) (Temporal)   Ht 5\' 5"  (1.651 m)   Wt 270 lb (122.5 kg)   SpO2 98%   BMI 44.93 kg/m   Wt Readings from Last 3 Encounters:  01/29/22 278 lb (126.1 kg)  10/23/21 275 lb (124.7 kg)  10/09/21 274 lb (124.3 kg)    [name]: Appears her stated age, obese,in NAD. Skin: Warm, dry and intact.  HEENT: Head: normal shape and size; Eyes: sclera white, no icterus, conjunctiva pink, PERRLA and EOMs intact;  Neck:  Neck supple, trachea midline. No masses, lumps or thyromegaly present.  Cardiovascular: Normal rate and rhythm. S1,S2 noted.  No murmur, rubs or gallops noted. No JVD or BLE edema.  Pulmonary/Chest: Normal effort and positive vesicular breath sounds. No respiratory distress. No wheezes, rales or ronchi noted.  Abdomen: Soft and nontender. Normal bowel sounds.  Pelvic: Normal female anatomy. Cervix without mass or lesion. No CMT. Adnexa non palpable. Musculoskeletal: Strength 5/5 BUE/BLE. No difficulty with gait.  Neurological: Alert and oriented. Cranial nerves II-XII grossly intact. Coordination normal.  Psychiatric: Mood and affect normal. Behavior is normal. Judgment and thought content normal.   BMET    Component Value Date/Time   NA 135 01/29/2022 0830   K 3.9 01/29/2022 0830   CL 103 01/29/2022 0830   CO2 21 01/29/2022 0830   GLUCOSE 121 (H) 01/29/2022 0830   BUN 11 01/29/2022 0830   CREATININE 0.88 01/29/2022 0830   CALCIUM 9.2 01/29/2022 0830   GFRNONAA 108 12/04/2020 0757   GFRAA 125 12/04/2020 0757    Lipid Panel     Component Value Date/Time   CHOL 125 01/29/2022 0830   TRIG 126 01/29/2022 0830   HDL 35 (L) 01/29/2022 0830   CHOLHDL 3.6 01/29/2022 0830   VLDL 55.4 (H) 08/28/2020 1507   LDLCALC 69 01/29/2022 0830    CBC    Component Value Date/Time   WBC 5.1 10/09/2021 1606   RBC 4.63 10/09/2021 1606   HGB 11.1 (L)  10/09/2021 1606   HCT 35.4 10/09/2021 1606   PLT 263 10/09/2021 1606   MCV 76.5 (L) 10/09/2021 1606   MCH 24.0 (L) 10/09/2021 1606   MCHC 31.4 (L) 10/09/2021 1606   RDW 18.2 (H) 10/09/2021 1606    Hgb A1C Lab Results  Component Value Date   HGBA1C 6.8 01/29/2022           Assessment & Plan:   Preventative health maintenance:  Encouraged her to get a flu shot in the fall Tetanus UTD Encouraged her to get her COVID booster Pneumovax UTD Pap smear today- she declines STD screening Mammogram ordered-she will call to schedule Referral to GI for screening colonoscopy Encouraged her to consume a balanced diet and exercise regimen  Advised her to see an eye doctor and dentist annually We will check CBC, c-Met, lipid, A1c and urine microalbumin today  RTC in 6 months, follow-up chronic conditions Nicki Reaper, NP

## 2022-11-27 LAB — HEMOGLOBIN A1C
Hgb A1c MFr Bld: 7 % of total Hgb — ABNORMAL HIGH (ref <5.7)
Mean Plasma Glucose: 154 mg/dL
eAG (mmol/L): 8.5 mmol/L

## 2022-11-27 LAB — COMPLETE METABOLIC PANEL WITH GFR
AG Ratio: 1 (calc) (ref 1.0–2.5)
ALT: 15 U/L (ref 6–29)
AST: 14 U/L (ref 10–35)
Albumin: 3.4 g/dL — ABNORMAL LOW (ref 3.6–5.1)
Alkaline phosphatase (APISO): 84 U/L (ref 31–125)
BUN: 9 mg/dL (ref 7–25)
CO2: 27 mmol/L (ref 20–32)
Calcium: 8.8 mg/dL (ref 8.6–10.2)
Chloride: 104 mmol/L (ref 98–110)
Creat: 0.64 mg/dL (ref 0.50–0.99)
Globulin: 3.5 g/dL (calc) (ref 1.9–3.7)
Glucose, Bld: 111 mg/dL — ABNORMAL HIGH (ref 65–99)
Potassium: 3.9 mmol/L (ref 3.5–5.3)
Sodium: 139 mmol/L (ref 135–146)
Total Bilirubin: 0.5 mg/dL (ref 0.2–1.2)
Total Protein: 6.9 g/dL (ref 6.1–8.1)
eGFR: 111 mL/min/{1.73_m2} (ref >=60)

## 2022-11-27 LAB — CBC
HCT: 35.3 % (ref 35.0–45.0)
Hemoglobin: 11 g/dL — ABNORMAL LOW (ref 11.7–15.5)
MCH: 24.6 pg — ABNORMAL LOW (ref 27.0–33.0)
MCHC: 31.2 g/dL — ABNORMAL LOW (ref 32.0–36.0)
MCV: 78.8 fL — ABNORMAL LOW (ref 80.0–100.0)
MPV: 9.8 fL (ref 7.5–12.5)
Platelets: 259 10*3/uL (ref 140–400)
RBC: 4.48 10*6/uL (ref 3.80–5.10)
RDW: 17 % — ABNORMAL HIGH (ref 11.0–15.0)
WBC: 4 10*3/uL (ref 3.8–10.8)

## 2022-11-27 LAB — LIPID PANEL
Cholesterol: 129 mg/dL (ref <200)
HDL: 38 mg/dL — ABNORMAL LOW (ref >=50)
LDL Cholesterol (Calc): 72 mg/dL (calc)
Non-HDL Cholesterol (Calc): 91 mg/dL (calc) (ref <130)
Total CHOL/HDL Ratio: 3.4 (calc) (ref <5.0)
Triglycerides: 111 mg/dL (ref <150)

## 2022-11-27 LAB — MICROALBUMIN / CREATININE URINE RATIO
Creatinine, Urine: 120 mg/dL (ref 20–275)
Microalb Creat Ratio: 8 mg/g creat (ref <30)
Microalb, Ur: 1 mg/dL

## 2022-11-29 LAB — CYTOLOGY - PAP: Adequacy: ABSENT

## 2022-11-30 ENCOUNTER — Encounter: Payer: Self-pay | Admitting: Gastroenterology

## 2022-11-30 LAB — CYTOLOGY - PAP
Comment: NEGATIVE
Diagnosis: NEGATIVE
High risk HPV: NEGATIVE

## 2022-12-01 ENCOUNTER — Other Ambulatory Visit: Payer: Self-pay

## 2022-12-01 ENCOUNTER — Ambulatory Visit: Payer: 59 | Admitting: Registered Nurse

## 2022-12-01 ENCOUNTER — Ambulatory Visit
Admission: RE | Admit: 2022-12-01 | Discharge: 2022-12-01 | Disposition: A | Discharge status: Home / Self Care | Payer: 59 | Source: Ambulatory Visit | Attending: Gastroenterology | Admitting: Gastroenterology

## 2022-12-01 ENCOUNTER — Encounter: Payer: Self-pay | Admitting: Gastroenterology

## 2022-12-01 ENCOUNTER — Encounter
Admission: RE | Disposition: A | Discharge status: Home / Self Care | Payer: Self-pay | Source: Ambulatory Visit | Attending: Gastroenterology

## 2022-12-01 DIAGNOSIS — Z6841 Body Mass Index (BMI) 40.0 and over, adult: Secondary | ICD-10-CM | POA: Insufficient documentation

## 2022-12-01 DIAGNOSIS — Z83719 Family history of colon polyps, unspecified: Secondary | ICD-10-CM | POA: Insufficient documentation

## 2022-12-01 DIAGNOSIS — E119 Type 2 diabetes mellitus without complications: Secondary | ICD-10-CM | POA: Diagnosis not present

## 2022-12-01 DIAGNOSIS — Z7984 Long term (current) use of oral hypoglycemic drugs: Secondary | ICD-10-CM | POA: Insufficient documentation

## 2022-12-01 DIAGNOSIS — Z1211 Encounter for screening for malignant neoplasm of colon: Secondary | ICD-10-CM | POA: Diagnosis not present

## 2022-12-01 DIAGNOSIS — I10 Essential (primary) hypertension: Secondary | ICD-10-CM | POA: Insufficient documentation

## 2022-12-01 HISTORY — DX: Type 2 diabetes mellitus without complications: E11.9

## 2022-12-01 HISTORY — PX: COLONOSCOPY WITH PROPOFOL: SHX5780

## 2022-12-01 LAB — POCT PREGNANCY, URINE: Preg Test, Ur: NEGATIVE

## 2022-12-01 LAB — GLUCOSE, CAPILLARY: Glucose-Capillary: 155 mg/dL — ABNORMAL HIGH (ref 70–99)

## 2022-12-01 SURGERY — COLONOSCOPY WITH PROPOFOL
Anesthesia: General

## 2022-12-01 MED ORDER — GLYCOPYRROLATE 0.2 MG/ML IJ SOLN
INTRAMUSCULAR | Status: AC
  Filled 2022-12-01: qty 1

## 2022-12-01 MED ORDER — LIDOCAINE HCL (PF) 2 % IJ SOLN
INTRAMUSCULAR | Status: AC
  Filled 2022-12-01: qty 15

## 2022-12-01 MED ORDER — PROPOFOL 1000 MG/100ML IV EMUL
INTRAVENOUS | Status: AC
  Filled 2022-12-01: qty 300

## 2022-12-01 MED ORDER — PROPOFOL 500 MG/50ML IV EMUL
INTRAVENOUS | Status: DC | PRN
  Administered 2022-12-01: 161.692 ug/kg/min via INTRAVENOUS

## 2022-12-01 MED ORDER — PROPOFOL 10 MG/ML IV BOLUS
INTRAVENOUS | Status: DC | PRN
  Administered 2022-12-01: 120 mg via INTRAVENOUS

## 2022-12-01 MED ORDER — GLYCOPYRROLATE 0.2 MG/ML IJ SOLN
INTRAMUSCULAR | Status: DC | PRN
  Administered 2022-12-01: .2 mg via INTRAVENOUS

## 2022-12-01 MED ORDER — DEXMEDETOMIDINE HCL IN NACL 80 MCG/20ML IV SOLN
INTRAVENOUS | Status: AC
  Filled 2022-12-01: qty 20

## 2022-12-01 MED ORDER — DEXMEDETOMIDINE HCL 200 MCG/2ML IV SOLN
INTRAVENOUS | Status: DC | PRN
  Administered 2022-12-01: 16 ug via INTRAVENOUS

## 2022-12-01 MED ORDER — LIDOCAINE HCL (CARDIAC) PF 100 MG/5ML IV SOSY
PREFILLED_SYRINGE | INTRAVENOUS | Status: DC | PRN
  Administered 2022-12-01: 100 mg via INTRAVENOUS

## 2022-12-01 MED ORDER — EPHEDRINE 5 MG/ML INJ
INTRAVENOUS | Status: AC
  Filled 2022-12-01: qty 5

## 2022-12-01 MED ORDER — SODIUM CHLORIDE 0.9 % IV SOLN: INTRAVENOUS | Status: DC

## 2022-12-01 NOTE — Op Note (Signed)
[facility] Gastroenterology Patient Name: Kathy [name] Procedure Date: 12/01/2022 7:50 AM MRN: [id number] Account #: [id number] Date of Birth: 1975-11-14 Admit Type: Outpatient Age: 47 Room: [facility] ENDO ROOM 4 Gender: Female Note Status: Finalized Instrument Name: Prentice Docker [id number] Procedure:             Colonoscopy Indications:           Screening for colorectal malignant neoplasm Providers:             Midge Minium MD, MD Medicines:             Propofol per Anesthesia Complications:         No immediate complications. Procedure:             Pre-Anesthesia Assessment:                        - Prior to the procedure, a History and Physical was                         performed, and patient medications and allergies were                         reviewed. The patient's tolerance of previous                         anesthesia was also reviewed. The risks and benefits                         of the procedure and the sedation options and risks                         were discussed with the patient. All questions were                         answered, and informed consent was obtained. Prior                         Anticoagulants: The patient has taken no anticoagulant                         or antiplatelet agents. ASA Grade Assessment: II - A                         patient with mild systemic disease. After reviewing                         the risks and benefits, the patient was deemed in                         satisfactory condition to undergo the procedure.                        After obtaining informed consent, the colonoscope was                         passed under direct vision. Throughout the procedure,                         the patient's blood  pressure, pulse, and oxygen                         saturations were monitored continuously. The                         Colonoscope was introduced through the anus and                         advanced to the  the cecum, identified by appendiceal                         orifice and ileocecal valve. The colonoscopy was                         performed without difficulty. The patient tolerated                         the procedure well. The quality of the bowel                         preparation was excellent. Findings:      The perianal and digital rectal examinations were normal.      The entire examined colon appeared normal. Impression:            - The entire examined colon is normal.                        - No specimens collected. Recommendation:        - Discharge patient to home.                        - Resume previous diet.                        - Continue present medications.                        - Repeat colonoscopy in 10 years for screening                         purposes. Procedure Code(s):     --- Professional ---                        [phone], Colonoscopy, flexible; diagnostic, including                         collection of specimen(s) by brushing or washing, when                         performed (separate procedure) Diagnosis Code(s):     --- Professional ---                        Z12.11, Encounter for screening for malignant neoplasm                         of colon CPT copyright 2022 [employer]. All rights reserved. The codes documented in this report are preliminary and upon coder review may  be revised to meet current  compliance requirements. Midge Minium MD, MD 12/01/2022 8:08:13 AM This report has been signed electronically. Number of Addenda: 0 Note Initiated On: 12/01/2022 7:50 AM Scope Withdrawal Time: 0 hours 7 minutes 5 seconds  Total Procedure Duration: 0 hours 11 minutes 25 seconds  Estimated Blood Loss:  Estimated blood loss: none.      [facility]

## 2022-12-01 NOTE — Anesthesia Preprocedure Evaluation (Signed)
Anesthesia Evaluation  Patient identified by MRN, date of birth, ID band Patient awake    Reviewed: Allergy & Precautions, H&P , NPO status , Patient's Chart, lab work & pertinent test results, reviewed documented beta blocker date and time   Airway Mallampati: II   Neck ROM: full    Dental  (+) Poor Dentition   Pulmonary neg pulmonary ROS   Pulmonary exam normal        Cardiovascular Exercise Tolerance: Good hypertension, On Medications negative cardio ROS Normal cardiovascular exam Rhythm:regular Rate:Normal     Neuro/Psych negative neurological ROS  negative psych ROS   GI/Hepatic negative GI ROS, Neg liver ROS,,,  Endo/Other  diabetes, Well Controlled, Type 2, Oral Hypoglycemic Agents  Morbid obesity  Renal/GU negative Renal ROS  negative genitourinary   Musculoskeletal   Abdominal   Peds  Hematology  (+) Blood dyscrasia, anemia   Anesthesia Other Findings Past Medical History: No date: Diabetes mellitus without complication (HCC) No date: Hypertension Past Surgical History: No date: WISDOM TOOTH EXTRACTION   Reproductive/Obstetrics negative OB ROS                             Anesthesia Physical Anesthesia Plan  ASA: 3  Anesthesia Plan: General   Post-op Pain Management:    Induction:   PONV Risk Score and Plan:   Airway Management Planned:   Additional Equipment:   Intra-op Plan:   Post-operative Plan:   Informed Consent: I have reviewed the patients History and Physical, chart, labs and discussed the procedure including the risks, benefits and alternatives for the proposed anesthesia with the patient or authorized representative who has indicated his/her understanding and acceptance.     Dental Advisory Given  Plan Discussed with: CRNA  Anesthesia Plan Comments:        Anesthesia Quick Evaluation

## 2022-12-01 NOTE — H&P (Signed)
Kathy Minium, MD [facility] [address] [place], [place] [address] Phone: [phone] Fax : [phone]  Primary Care Physician:  Lorre Munroe, NP Primary Gastroenterologist:  Dr. Servando Snare  Pre-Procedure History & Physical: HPI:  Kathy [name] is a 47 y.o. female is here for a screening colonoscopy.   Past Medical History:  Diagnosis Date   Diabetes mellitus without complication (HCC)    Hypertension     Past Surgical History:  Procedure Laterality Date   WISDOM TOOTH EXTRACTION      Prior to Admission medications   Medication Sig Start Date End Date Taking? Authorizing Provider  amLODipine (NORVASC) 10 MG tablet Take 1 tablet (10 mg total) by mouth daily. 09/01/22  Yes Baity, Salvadore Oxford, NP  Blood Glucose Monitoring Suppl (BLOOD GLUCOSE MONITOR SYSTEM) w/Device KIT Use to check blood sugar daily as directed. 10/23/21  Yes Baity, Salvadore Oxford, NP  fluticasone (FLONASE) 50 MCG/ACT nasal spray Place 2 sprays into both nostrils daily. 11/24/21  Yes Lorre Munroe, NP  glipiZIDE (GLUCOTROL XL) 5 MG 24 hr tablet Take 1 tablet (5 mg total) by mouth daily with breakfast. 09/01/22  Yes Baity, Salvadore Oxford, NP  glucose blood test strip Use to check blood sugar daily as directed 10/23/21  Yes   Iron, Ferrous Sulfate, 325 (65 Fe) MG TABS Take 325 mg by mouth daily. 01/29/22  Yes Lorre Munroe, NP  Lancets (FREESTYLE) lancets Use to check blood sugar daily as directed 10/23/21  Yes   losartan (COZAAR) 100 MG tablet Take 1 tablet (100 mg total) by mouth daily. 09/01/22  Yes Lorre Munroe, NP  simvastatin (ZOCOR) 20 MG tablet Take 1 tablet (20 mg total) by mouth at bedtime. 02/23/22  Yes Baity, Salvadore Oxford, NP  spironolactone (ALDACTONE) 25 MG tablet TAKE 1 TABLET (25 MG TOTAL) BY MOUTH DAILY. 05/29/22  Yes Baity, Salvadore Oxford, NP  tirzepatide [facility]) 2.5 MG/0.5ML Pen Inject 2.5 mg into the skin once a week. Patient not taking: Reported on 12/01/2022 11/26/22   Lorre Munroe, NP    Allergies as  of 11/26/2022   (No Known Allergies)    Family History  Problem Relation Age of Onset   Hypertension Mother    Colon polyps Mother    Breast cancer Paternal Grandmother    Diabetes Maternal Aunt     Social History   Socioeconomic History   Marital status: Single    Spouse name: Not on file   Number of children: Not on file   Years of education: Not on file   Highest education level: Not on file  Occupational History   Not on file  Tobacco Use   Smoking status: Never   Smokeless tobacco: Never  Vaping Use   Vaping Use: Never used  Substance and Sexual Activity   Alcohol use: Yes    Alcohol/week: 0.0 standard drinks of alcohol    Comment: rare   Drug use: No   Sexual activity: Yes    Birth control/protection: Surgical  Other Topics Concern   Not on file  Social History Narrative   Not on file   Social Determinants of Health   Financial Resource Strain: Not on file  Food Insecurity: Not on file  Transportation Needs: Not on file  Physical Activity: Not on file  Stress: Not on file  Social Connections: Not on file  Intimate Partner Violence: Not on file    Review of Systems: See HPI, otherwise negative ROS  Physical Exam:  BP (!) 157/84   Pulse 76   Temp (!) 97.3 F (36.3 C) (Temporal)   Resp 16   Ht 5\' 4"  (1.626 m)   Wt 120.6 kg   LMP 11/05/2022   SpO2 100%   BMI 45.62 kg/m  General:   Alert,  pleasant and cooperative in NAD Head:  Normocephalic and atraumatic. Neck:  Supple; no masses or thyromegaly. Lungs:  Clear throughout to auscultation.    Heart:  Regular rate and rhythm. Abdomen:  Soft, nontender and nondistended. Normal bowel sounds, without guarding, and without rebound.   Neurologic:  Alert and  oriented x4;  grossly normal neurologically.  Impression/Plan: Kathy [name] is now here to undergo a screening colonoscopy.  Risks, benefits, and alternatives regarding colonoscopy have been reviewed with the patient.  Questions have  been answered.  All parties agreeable.

## 2022-12-01 NOTE — Anesthesia Postprocedure Evaluation (Signed)
Anesthesia Post Note  Patient: Kathy [name]  Procedure(s) Performed: COLONOSCOPY WITH PROPOFOL  Patient location during evaluation: PACU Anesthesia Type: General Level of consciousness: awake and alert Pain management: pain level controlled Vital Signs Assessment: post-procedure vital signs reviewed and stable Respiratory status: spontaneous breathing, nonlabored ventilation, respiratory function stable and patient connected to nasal cannula oxygen Cardiovascular status: blood pressure returned to baseline and stable Postop Assessment: no apparent nausea or vomiting Anesthetic complications: no   No notable events documented.   Last Vitals:  Vitals:   12/01/22 0831 12/01/22 0841  BP: 116/76 135/86  Pulse: (!) 57 62  Resp: 18 19  Temp:    SpO2: 100% 99%    Last Pain:  Vitals:   12/01/22 0841  TempSrc:   PainSc: 0-No pain                 Yevette Edwards

## 2022-12-01 NOTE — Transfer of Care (Signed)
Immediate Anesthesia Transfer of Care Note  Patient: Kathy [name]  Procedure(s) Performed: COLONOSCOPY WITH PROPOFOL  Patient Location: Endoscopy Unit  Anesthesia Type:General  Level of Consciousness: drowsy  Airway & Oxygen Therapy: Patient Spontanous Breathing  Post-op Assessment: Report given to RN and Post -op Vital signs reviewed and stable  Post vital signs: Reviewed and stable  Last Vitals: see EPIC Flowsheet data Vitals Value Taken Time  BP    Temp    Pulse    Resp    SpO2      Last Pain:  Vitals:   12/01/22 0727  TempSrc: Temporal  PainSc: 0-No pain         Complications: No notable events documented.

## 2022-12-02 ENCOUNTER — Encounter: Payer: Self-pay | Admitting: Gastroenterology

## 2022-12-15 ENCOUNTER — Other Ambulatory Visit: Payer: Self-pay

## 2022-12-15 ENCOUNTER — Other Ambulatory Visit: Payer: Self-pay | Admitting: Internal Medicine

## 2022-12-15 DIAGNOSIS — E1165 Type 2 diabetes mellitus with hyperglycemia: Secondary | ICD-10-CM

## 2022-12-15 MED ORDER — GLIPIZIDE ER 5 MG PO TB24
5.0000 mg | ORAL_TABLET | Freq: Every day | ORAL | 1 refills | Status: DC
  Filled 2022-12-15: qty 90, 90d supply, fill #0
  Filled 2023-04-22: qty 90, 90d supply, fill #1

## 2022-12-15 MED ORDER — LOSARTAN POTASSIUM 100 MG PO TABS
100.0000 mg | ORAL_TABLET | Freq: Every day | ORAL | 1 refills | Status: DC
  Filled 2022-12-15: qty 30, 30d supply, fill #0
  Filled 2023-01-29: qty 30, 30d supply, fill #1

## 2022-12-15 MED ORDER — SPIRONOLACTONE 25 MG PO TABS
25.0000 mg | ORAL_TABLET | Freq: Every day | ORAL | 1 refills | Status: DC
  Filled 2022-12-15: qty 90, 90d supply, fill #0
  Filled 2023-05-19: qty 90, 90d supply, fill #1

## 2022-12-15 MED ORDER — AMLODIPINE BESYLATE 10 MG PO TABS
10.0000 mg | ORAL_TABLET | Freq: Every day | ORAL | 1 refills | Status: DC
  Filled 2022-12-15: qty 30, 30d supply, fill #0
  Filled 2023-01-29: qty 30, 30d supply, fill #1

## 2022-12-15 MED ORDER — SIMVASTATIN 20 MG PO TABS
20.0000 mg | ORAL_TABLET | Freq: Every day | ORAL | 3 refills | Status: DC
  Filled 2022-12-15: qty 90, 90d supply, fill #0
  Filled 2023-08-06: qty 90, 90d supply, fill #1

## 2022-12-15 NOTE — Telephone Encounter (Signed)
Requested Prescriptions  Pending Prescriptions Disp Refills   simvastatin (ZOCOR) 20 MG tablet 90 tablet 3    Sig: Take 1 tablet (20 mg total) by mouth at bedtime.     Cardiovascular:  Antilipid - Statins Failed - 12/15/2022  8:56 AM      Failed - Lipid Panel in normal range within the last 12 months    Cholesterol  Date Value Ref Range Status  11/26/2022 129 <200 mg/dL Final   LDL Cholesterol (Calc)  Date Value Ref Range Status  11/26/2022 72 mg/dL (calc) Final    Comment:    Reference range: <100 . Desirable range <100 mg/dL for primary prevention;   <70 mg/dL for patients with CHD or diabetic patients  with > or = 2 CHD risk factors. Marland Kitchen LDL-C is now calculated using the Martin-Hopkins  calculation, which is a validated novel method providing  better accuracy than the Friedewald equation in the  estimation of LDL-C.  Horald Pollen et al. Lenox Ahr. 1610;960(45): 2061-2068  (http://education.[employer].com/faq/FAQ164)    Direct LDL  Date Value Ref Range Status  08/28/2020 62.0 mg/dL Final    Comment:    Optimal:  <100 mg/dLNear or Above Optimal:  100-129 mg/dLBorderline High:  130-159 mg/dLHigh:  160-189 mg/dLVery High:  >190 mg/dL   HDL  Date Value Ref Range Status  11/26/2022 38 (L) > OR = 50 mg/dL Final   Triglycerides  Date Value Ref Range Status  11/26/2022 111 <150 mg/dL Final         Passed - Patient is not pregnant      Passed - Valid encounter within last 12 months    Recent Outpatient Visits           2 weeks ago Encounter for general adult medical examination with abnormal findings   [place] [facility] [place], Salvadore Oxford, NP   9 months ago Primary hypertension   [place] [facility] [place], Gentry Fitz A, RPH-CPP   10 months ago Type 2 diabetes mellitus with hyperglycemia, without long-term current use of insulin [facility])   [place] [facility] [place], Salvadore Oxford, NP   11 months ago Primary hypertension    [place] [facility] Delles, Gentry Fitz A, RPH-CPP   11 months ago Primary hypertension   [place] [facility] Delles, Gentry Fitz A, RPH-CPP               spironolactone (ALDACTONE) 25 MG tablet 90 tablet 1    Sig: TAKE 1 TABLET (25 MG TOTAL) BY MOUTH DAILY.     Cardiovascular: Diuretics - Aldosterone Antagonist Passed - 12/15/2022  8:56 AM      Passed - Cr in normal range and within 180 days    Creat  Date Value Ref Range Status  11/26/2022 0.64 0.50 - 0.99 mg/dL Final   Creatinine, Urine  Date Value Ref Range Status  11/26/2022 120 20 - 275 mg/dL Final         Passed - K in normal range and within 180 days    Potassium  Date Value Ref Range Status  11/26/2022 3.9 3.5 - 5.3 mmol/L Final         Passed - Na in normal range and within 180 days    Sodium  Date Value Ref Range Status  11/26/2022 139 135 - 146 mmol/L Final         Passed - eGFR is 30 or above and within 180 days  GFR, Est African American  Date Value Ref Range Status  12/04/2020 125 > OR = 60 mL/min/1.77m2 Final   GFR, Est Non African American  Date Value Ref Range Status  12/04/2020 108 > OR = 60 mL/min/1.37m2 Final   GFR  Date Value Ref Range Status  09/17/2020 106.57 >60.00 mL/min Final    Comment:    Calculated using the CKD-EPI Creatinine Equation (2021)   eGFR  Date Value Ref Range Status  11/26/2022 111 > OR = 60 mL/min/1.53m2 Final         Passed - Last BP in normal range    BP Readings from Last 1 Encounters:  12/01/22 135/86         Passed - Valid encounter within last 6 months    Recent Outpatient Visits           2 weeks ago Encounter for general adult medical examination with abnormal findings   [place] [facility] [place], Salvadore Oxford, NP   9 months ago Primary hypertension   [place] [facility] Delles, Gentry Fitz A, RPH-CPP   10 months ago Type 2 diabetes mellitus with hyperglycemia, without long-term  current use of insulin [facility])   [place] [facility] [place], Salvadore Oxford, NP   11 months ago Primary hypertension   [place] [facility] D[facility], Gentry Fitz A, RPH-CPP   11 months ago Primary hypertension   [place] [facility] Delles, Gentry Fitz A, RPH-CPP               amLODipine (NORVASC) 10 MG tablet 30 tablet 1    Sig: Take 1 tablet (10 mg total) by mouth daily.     Cardiovascular: Calcium Channel Blockers 2 Passed - 12/15/2022  8:56 AM      Passed - Last BP in normal range    BP Readings from Last 1 Encounters:  12/01/22 135/86         Passed - Last Heart Rate in normal range    Pulse Readings from Last 1 Encounters:  12/01/22 62         Passed - Valid encounter within last 6 months    Recent Outpatient Visits           2 weeks ago Encounter for general adult medical examination with abnormal findings   [place] [facility] [place], Salvadore Oxford, NP   9 months ago Primary hypertension   [place] [facility] Delles, Gentry Fitz A, RPH-CPP   10 months ago Type 2 diabetes mellitus with hyperglycemia, without long-term current use of insulin [facility])   [place] [facility] [place], Salvadore Oxford, NP   11 months ago Primary hypertension    [facility], Gentry Fitz A, RPH-CPP   11 months ago Primary hypertension    [place] [facility] Delles, Gentry Fitz A, RPH-CPP               glipiZIDE (GLUCOTROL XL) 5 MG 24 hr tablet 90 tablet 1    Sig: Take 1 tablet (5 mg total) by mouth daily with breakfast.     Endocrinology:  Diabetes - Sulfonylureas Passed - 12/15/2022  8:56 AM      Passed - HBA1C is between 0 and 7.9 and within 180 days    HbA1c, POC (controlled diabetic range)  Date Value Ref Range Status  01/29/2022 6.8 0.0 - 7.0 % Final  Hgb A1c MFr Bld  Date Value Ref Range Status  11/26/2022 7.0 (H) <5.7 % of total Hgb Final     Comment:    For someone without known diabetes, a hemoglobin A1c value of 6.5% or greater indicates that they may have  diabetes and this should be confirmed with a follow-up  test. . For someone with known diabetes, a value <7% indicates  that their diabetes is well controlled and a value  greater than or equal to 7% indicates suboptimal  control. A1c targets should be individualized based on  duration of diabetes, age, comorbid conditions, and  other considerations. . Currently, no consensus exists regarding use of hemoglobin A1c for diagnosis of diabetes for children. .          Passed - Cr in normal range and within 360 days    Creat  Date Value Ref Range Status  11/26/2022 0.64 0.50 - 0.99 mg/dL Final   Creatinine, Urine  Date Value Ref Range Status  11/26/2022 120 20 - 275 mg/dL Final         Passed - Valid encounter within last 6 months    Recent Outpatient Visits           2 weeks ago Encounter for general adult medical examination with abnormal findings   [place] [facility] [place], Salvadore Oxford, NP   9 months ago Primary hypertension   [place] [facility] D[place], Gentry Fitz A, RPH-CPP   10 months ago Type 2 diabetes mellitus with hyperglycemia, without long-term current use of insulin [facility])   [place] [facility] [place], Salvadore Oxford, NP   11 months ago Primary hypertension   [place] [facility] Delles, Gentry Fitz A, RPH-CPP   11 months ago Primary hypertension   [place] [facility] D[facility], Gentry Fitz A, RPH-CPP               losartan (COZAAR) 100 MG tablet 30 tablet 1    Sig: Take 1 tablet (100 mg total) by mouth daily.     Cardiovascular:  Angiotensin Receptor Blockers Passed - 12/15/2022  8:56 AM      Passed - Cr in normal range and within 180 days    Creat  Date Value Ref Range Status  11/26/2022 0.64 0.50 - 0.99 mg/dL Final   Creatinine, Urine  Date Value  Ref Range Status  11/26/2022 120 20 - 275 mg/dL Final         Passed - K in normal range and within 180 days    Potassium  Date Value Ref Range Status  11/26/2022 3.9 3.5 - 5.3 mmol/L Final         Passed - Patient is not pregnant      Passed - Last BP in normal range    BP Readings from Last 1 Encounters:  12/01/22 135/86         Passed - Valid encounter within last 6 months    Recent Outpatient Visits           2 weeks ago Encounter for general adult medical examination with abnormal findings   [place] [facility] [place], Salvadore Oxford, NP   9 months ago Primary hypertension   [place] [facility] D[place], Gentry Fitz A, RPH-CPP   10 months ago Type 2 diabetes mellitus with hyperglycemia, without long-term current use of insulin [facility])    [facility] [place],  Salvadore Oxford, NP   11 months ago Primary hypertension   [place] [facility] D[place], Gentry Fitz A, RPH-CPP   11 months ago Primary hypertension   [place] [facility] D[place], Gentry Fitz A, RPH-CPP               tirzepatide [facility]) 2.5 MG/0.5ML Pen 2 mL     Sig: Inject 2.5 mg into the skin once a week.     Off-Protocol Failed - 12/15/2022  8:56 AM      Failed - Medication not assigned to a protocol, review manually.      Passed - Valid encounter within last 12 months    Recent Outpatient Visits           2 weeks ago Encounter for general adult medical examination with abnormal findings   [place] [facility] [place], Salvadore Oxford, NP   9 months ago Primary hypertension   [place] [facility] [place], Gentry Fitz A, RPH-CPP   10 months ago Type 2 diabetes mellitus with hyperglycemia, without long-term current use of insulin [facility])   [place] [facility] [place], Salvadore Oxford, [place]   11 months ago Primary hypertension   [place] [facility] Delles, Jackelyn Poling,  RPH-CPP   11 months ago Primary hypertension   [place] [facility] Delles, Jackelyn Poling, RPH-CPP

## 2022-12-15 NOTE — Telephone Encounter (Signed)
Requested medication (s) are due for refill today: no  Requested medication (s) are on the active medication list: yes  Last refill:  11/26/22 2 ml  Future visit scheduled: no  Notes to clinic:  med not assigned to a protocol    Requested Prescriptions  Pending Prescriptions Disp Refills   tirzepatide (MOUNJARO) 2.5 MG/0.5ML Pen 2 mL     Sig: Inject 2.5 mg into the skin once a week.     Off-Protocol Failed - 12/15/2022  8:56 AM      Failed - Medication not assigned to a protocol, review manually.      Passed - Valid encounter within last 12 months    Recent Outpatient Visits           2 weeks ago Encounter for general adult medical examination with abnormal findings   [place] [facility] [place], Salvadore Oxford, NP   9 months ago Primary hypertension   [place] [facility] Delles, Gentry Fitz A, RPH-CPP   10 months ago Type 2 diabetes mellitus with hyperglycemia, without long-term current use of insulin [facility])   [place] [facility] [facility] [place], Salvadore Oxford, NP   11 months ago Primary hypertension   [place] [facility] Delles, Gentry Fitz A, RPH-CPP   11 months ago Primary hypertension   [place] [facility] Delles, Gentry Fitz A, RPH-CPP              Signed Prescriptions Disp Refills   simvastatin (ZOCOR) 20 MG tablet 90 tablet 3    Sig: Take 1 tablet (20 mg total) by mouth at bedtime.     Cardiovascular:  Antilipid - Statins Failed - 12/15/2022  8:56 AM      Failed - Lipid Panel in normal range within the last 12 months    Cholesterol  Date Value Ref Range Status  11/26/2022 129 <200 mg/dL Final   LDL Cholesterol (Calc)  Date Value Ref Range Status  11/26/2022 72 mg/dL (calc) Final    Comment:    Reference range: <100 . Desirable range <100 mg/dL for primary prevention;   <70 mg/dL for patients with CHD or diabetic patients  with > or = 2 CHD risk factors. Marland Kitchen LDL-C is now calculated  using the Martin-Hopkins  calculation, which is a validated novel method providing  better accuracy than the Friedewald equation in the  estimation of LDL-C.  Horald Pollen et al. Lenox Ahr. 1610;960(45): 2061-2068  (http://education.[employer].com/faq/FAQ164)    Direct LDL  Date Value Ref Range Status  08/28/2020 62.0 mg/dL Final    Comment:    Optimal:  <100 mg/dLNear or Above Optimal:  100-129 mg/dLBorderline High:  130-159 mg/dLHigh:  160-189 mg/dLVery High:  >190 mg/dL   HDL  Date Value Ref Range Status  11/26/2022 38 (L) > OR = 50 mg/dL Final   Triglycerides  Date Value Ref Range Status  11/26/2022 111 <150 mg/dL Final         Passed - Patient is not pregnant      Passed - Valid encounter within last 12 months    Recent Outpatient Visits           2 weeks ago Encounter for general adult medical examination with abnormal findings   [place] [facility] [place], Salvadore Oxford, NP   9 months ago Primary hypertension   [place] [facility] Delles, Gentry Fitz A, RPH-CPP   10 months ago Type 2 diabetes mellitus  with hyperglycemia, without long-term current use of insulin [facility])   [place] [facility] [place], Salvadore Oxford, [place]   11 months ago Primary hypertension   [place] [facility] Delles, Jackelyn Poling, RPH-CPP   11 months ago Primary hypertension   [place] [facility] Delles, Gentry Fitz A, RPH-CPP               spironolactone (ALDACTONE) 25 MG tablet 90 tablet 1    Sig: TAKE 1 TABLET (25 MG TOTAL) BY MOUTH DAILY.     Cardiovascular: Diuretics - Aldosterone Antagonist Passed - 12/15/2022  8:56 AM      Passed - Cr in normal range and within 180 days    Creat  Date Value Ref Range Status  11/26/2022 0.64 0.50 - 0.99 mg/dL Final   Creatinine, Urine  Date Value Ref Range Status  11/26/2022 120 20 - 275 mg/dL Final         Passed - K in normal range and within 180 days    Potassium   Date Value Ref Range Status  11/26/2022 3.9 3.5 - 5.3 mmol/L Final         Passed - Na in normal range and within 180 days    Sodium  Date Value Ref Range Status  11/26/2022 139 135 - 146 mmol/L Final         Passed - eGFR is 30 or above and within 180 days    GFR, Est African American  Date Value Ref Range Status  12/04/2020 125 > OR = 60 mL/min/1.3m2 Final   GFR, Est Non African American  Date Value Ref Range Status  12/04/2020 108 > OR = 60 mL/min/1.70m2 Final   GFR  Date Value Ref Range Status  09/17/2020 106.57 >60.00 mL/min Final    Comment:    Calculated using the CKD-EPI Creatinine Equation (2021)   eGFR  Date Value Ref Range Status  11/26/2022 111 > OR = 60 mL/min/1.29m2 Final         Passed - Last BP in normal range    BP Readings from Last 1 Encounters:  12/01/22 135/86         Passed - Valid encounter within last 6 months    Recent Outpatient Visits           2 weeks ago Encounter for general adult medical examination with abnormal findings   [place] [facility] [place], Salvadore Oxford, NP   9 months ago Primary hypertension   [place] [facility] Delles, Gentry Fitz A, RPH-CPP   10 months ago Type 2 diabetes mellitus with hyperglycemia, without long-term current use of insulin [facility])   [place] [facility] [place], Salvadore Oxford, NP   11 months ago Primary hypertension   [place] [facility] Delles, Gentry Fitz A, RPH-CPP   11 months ago Primary hypertension   [place] [facility] Delles, Gentry Fitz A, RPH-CPP               amLODipine (NORVASC) 10 MG tablet 30 tablet 1    Sig: Take 1 tablet (10 mg total) by mouth daily.     Cardiovascular: Calcium Channel Blockers 2 Passed - 12/15/2022  8:56 AM      Passed - Last BP in normal range    BP Readings from Last 1 Encounters:  12/01/22 135/86         Passed - Last Heart Rate  in normal range    Pulse Readings from Last  1 Encounters:  12/01/22 62         Passed - Valid encounter within last 6 months    Recent Outpatient Visits           2 weeks ago Encounter for general adult medical examination with abnormal findings   [place] [facility] [place], Salvadore Oxford, NP   9 months ago Primary hypertension   [place] [facility] Delles, Gentry Fitz A, RPH-CPP   10 months ago Type 2 diabetes mellitus with hyperglycemia, without long-term current use of insulin [facility])   [place] [facility] [place], Salvadore Oxford, NP   11 months ago Primary hypertension   [place] [facility] Delles, Gentry Fitz A, RPH-CPP   11 months ago Primary hypertension   [place] [facility] Delles, Gentry Fitz A, RPH-CPP               glipiZIDE (GLUCOTROL XL) 5 MG 24 hr tablet 90 tablet 1    Sig: Take 1 tablet (5 mg total) by mouth daily with breakfast.     Endocrinology:  Diabetes - Sulfonylureas Passed - 12/15/2022  8:56 AM      Passed - HBA1C is between 0 and 7.9 and within 180 days    HbA1c, POC (controlled diabetic range)  Date Value Ref Range Status  01/29/2022 6.8 0.0 - 7.0 % Final   Hgb A1c MFr Bld  Date Value Ref Range Status  11/26/2022 7.0 (H) <5.7 % of total Hgb Final    Comment:    For someone without known diabetes, a hemoglobin A1c value of 6.5% or greater indicates that they may have  diabetes and this should be confirmed with a follow-up  test. . For someone with known diabetes, a value <7% indicates  that their diabetes is well controlled and a value  greater than or equal to 7% indicates suboptimal  control. A1c targets should be individualized based on  duration of diabetes, age, comorbid conditions, and  other considerations. . Currently, no consensus exists regarding use of hemoglobin A1c for diagnosis of diabetes for children. .          Passed - Cr in normal range and within 360 days    Creat  Date Value Ref  Range Status  11/26/2022 0.64 0.50 - 0.99 mg/dL Final   Creatinine, Urine  Date Value Ref Range Status  11/26/2022 120 20 - 275 mg/dL Final         Passed - Valid encounter within last 6 months    Recent Outpatient Visits           2 weeks ago Encounter for general adult medical examination with abnormal findings   [place] [facility] [place], Salvadore Oxford, NP   9 months ago Primary hypertension   [place] [facility] Delles, Gentry Fitz A, RPH-CPP   10 months ago Type 2 diabetes mellitus with hyperglycemia, without long-term current use of insulin [facility])   [place] [facility] [place], Salvadore Oxford, NP   11 months ago Primary hypertension   [place] [facility] Delles, Jackelyn Poling, RPH-CPP   11 months ago Primary hypertension    [facility], Gentry Fitz A, RPH-CPP               losartan (COZAAR) 100 MG tablet 30 tablet 1  Sig: Take 1 tablet (100 mg total) by mouth daily.     Cardiovascular:  Angiotensin Receptor Blockers Passed - 12/15/2022  8:56 AM      Passed - Cr in normal range and within 180 days    Creat  Date Value Ref Range Status  11/26/2022 0.64 0.50 - 0.99 mg/dL Final   Creatinine, Urine  Date Value Ref Range Status  11/26/2022 120 20 - 275 mg/dL Final         Passed - K in normal range and within 180 days    Potassium  Date Value Ref Range Status  11/26/2022 3.9 3.5 - 5.3 mmol/L Final         Passed - Patient is not pregnant      Passed - Last BP in normal range    BP Readings from Last 1 Encounters:  12/01/22 135/86         Passed - Valid encounter within last 6 months    Recent Outpatient Visits           2 weeks ago Encounter for general adult medical examination with abnormal findings   [place] [facility] [place], Salvadore Oxford, NP   9 months ago Primary hypertension   [place] [facility] Delles, Gentry Fitz A,  RPH-CPP   10 months ago Type 2 diabetes mellitus with hyperglycemia, without long-term current use of insulin [facility])   [place] [facility] [place], Salvadore Oxford, NP   11 months ago Primary hypertension   [place] [facility] Delles, Gentry Fitz A, RPH-CPP   11 months ago Primary hypertension    [facility] Delles, Gentry Fitz A, RPH-CPP

## 2022-12-23 ENCOUNTER — Other Ambulatory Visit: Payer: Self-pay | Admitting: Internal Medicine

## 2022-12-23 DIAGNOSIS — E1165 Type 2 diabetes mellitus with hyperglycemia: Secondary | ICD-10-CM

## 2022-12-23 NOTE — Telephone Encounter (Signed)
Medication Refill - Medication: tirzepatide Greggory Keen) 2.5 MG/0.5ML Pen   Has the patient contacted their pharmacy? No.  Preferred Pharmacy (with phone number or street name):  [facility] Phone: [phone]  Fax: [phone]     Has the patient been seen for an appointment in the last year OR does the patient have an upcoming appointment? No.  Agent: Please be advised that RX refills may take up to 3 business days. We ask that you follow-up with your pharmacy.

## 2022-12-23 NOTE — Telephone Encounter (Signed)
We should increase to the 5 mg.  If she agreeable with this?

## 2022-12-23 NOTE — Telephone Encounter (Signed)
Pt advised and agreed to go up to 5mg .   Thanks,   -Vernona Rieger

## 2022-12-23 NOTE — Telephone Encounter (Signed)
Requested medication (s) are due for refill today - yes  Requested medication (s) are on the active medication list -yes  Future visit scheduled -no  Last refill: 11/26/22  Notes to clinic: off protocol- provider review - Call to patient- she verified she is taking without SE- she had to wait until she had her procedure before starting. Patient does want to continue.  Requested Prescriptions  Pending Prescriptions Disp Refills   tirzepatide (MOUNJARO) 2.5 MG/0.5ML Pen 2 mL 0    Sig: Inject 2.5 mg into the skin once a week.     Off-Protocol Failed - 12/23/2022 11:03 AM      Failed - Medication not assigned to a protocol, review manually.      Passed - Valid encounter within last 12 months    Recent Outpatient Visits           3 weeks ago Encounter for general adult medical examination with abnormal findings   [place] [facility] [place], Salvadore Oxford, NP   10 months ago Primary hypertension   [place] [facility] Delles, Gentry Fitz A, RPH-CPP   10 months ago Type 2 diabetes mellitus with hyperglycemia, without long-term current use of insulin [facility])   [place] [facility] [place], Salvadore Oxford, NP   11 months ago Primary hypertension   [place] [facility] Delles, Gentry Fitz A, RPH-CPP   12 months ago Primary hypertension   [place] [facility] Delles, Gentry Fitz A, RPH-CPP                 Requested Prescriptions  Pending Prescriptions Disp Refills   tirzepatide (MOUNJARO) 2.5 MG/0.5ML Pen 2 mL 0    Sig: Inject 2.5 mg into the skin once a week.     Off-Protocol Failed - 12/23/2022 11:03 AM      Failed - Medication not assigned to a protocol, review manually.      Passed - Valid encounter within last 12 months    Recent Outpatient Visits           3 weeks ago Encounter for general adult medical examination with abnormal findings   [place] [facility] [place], Salvadore Oxford,  NP   10 months ago Primary hypertension   [place] [facility] Delles, Gentry Fitz A, RPH-CPP   10 months ago Type 2 diabetes mellitus with hyperglycemia, without long-term current use of insulin [facility])   [place] [facility] [place], Salvadore Oxford, [place]   11 months ago Primary hypertension   [place] [facility] Delles, Jackelyn Poling, RPH-CPP   12 months ago Primary hypertension   [place] [facility] Delles, Jackelyn Poling, RPH-CPP

## 2022-12-24 ENCOUNTER — Other Ambulatory Visit: Payer: Self-pay

## 2022-12-24 MED ORDER — TIRZEPATIDE 5 MG/0.5ML ~~LOC~~ SOAJ
5.0000 mg | SUBCUTANEOUS | 0 refills | Status: DC
  Filled 2022-12-24: qty 2, 28d supply, fill #0
  Filled 2023-01-25: qty 2, 28d supply, fill #1
  Filled 2023-02-25: qty 2, 28d supply, fill #2

## 2023-01-14 ENCOUNTER — Ambulatory Visit
Admission: RE | Admit: 2023-01-14 | Discharge: 2023-01-14 | Disposition: A | Discharge status: Home / Self Care | Payer: 59 | Source: Ambulatory Visit | Attending: Internal Medicine | Admitting: Internal Medicine

## 2023-01-14 DIAGNOSIS — Z1231 Encounter for screening mammogram for malignant neoplasm of breast: Secondary | ICD-10-CM | POA: Diagnosis not present

## 2023-01-29 ENCOUNTER — Other Ambulatory Visit: Payer: Self-pay

## 2023-02-25 ENCOUNTER — Other Ambulatory Visit: Payer: Self-pay | Admitting: Internal Medicine

## 2023-02-25 ENCOUNTER — Other Ambulatory Visit: Payer: Self-pay

## 2023-02-26 ENCOUNTER — Other Ambulatory Visit: Payer: Self-pay

## 2023-03-01 ENCOUNTER — Other Ambulatory Visit: Payer: Self-pay

## 2023-03-01 MED FILL — Losartan Potassium Tab 100 MG: ORAL | 90 days supply | Qty: 90 | Fill #0 | Status: AC

## 2023-03-01 MED FILL — Amlodipine Besylate Tab 10 MG (Base Equivalent): ORAL | 90 days supply | Qty: 90 | Fill #0 | Status: AC

## 2023-03-01 NOTE — Telephone Encounter (Signed)
Requested Prescriptions  Pending Prescriptions Disp Refills   amLODipine (NORVASC) 10 MG tablet 90 tablet 0    Sig: Take 1 tablet (10 mg total) by mouth daily.     Cardiovascular: Calcium Channel Blockers 2 Passed - 02/25/2023  5:58 PM      Passed - Last BP in normal range    BP Readings from Last 1 Encounters:  12/01/22 135/86         Passed - Last Heart Rate in normal range    Pulse Readings from Last 1 Encounters:  12/01/22 62         Passed - Valid encounter within last 6 months    Recent Outpatient Visits           3 months ago Encounter for general adult medical examination with abnormal findings   [place] [facility] [place], Salvadore Oxford, NP   1 year ago Primary hypertension   [place] [facility] Delles, Gentry Fitz A, RPH-CPP   1 year ago Type 2 diabetes mellitus with hyperglycemia, without long-term current use of insulin [facility])   [place] [facility] [place], Salvadore Oxford, NP   1 year ago Primary hypertension   [place] [facility] Delles, Gentry Fitz A, RPH-CPP   1 year ago Primary hypertension   [place] [facility] Delles, Gentry Fitz A, RPH-CPP               losartan (COZAAR) 100 MG tablet 90 tablet 0    Sig: Take 1 tablet (100 mg total) by mouth daily.     Cardiovascular:  Angiotensin Receptor Blockers Passed - 02/25/2023  5:58 PM      Passed - Cr in normal range and within 180 days    Creat  Date Value Ref Range Status  11/26/2022 0.64 0.50 - 0.99 mg/dL Final   Creatinine, Urine  Date Value Ref Range Status  11/26/2022 120 20 - 275 mg/dL Final         Passed - K in normal range and within 180 days    Potassium  Date Value Ref Range Status  11/26/2022 3.9 3.5 - 5.3 mmol/L Final         Passed - Patient is not pregnant      Passed - Last BP in normal range    BP Readings from Last 1 Encounters:  12/01/22 135/86         Passed - Valid encounter within last 6  months    Recent Outpatient Visits           3 months ago Encounter for general adult medical examination with abnormal findings   [place] [facility] [place], Salvadore Oxford, NP   1 year ago Primary hypertension   [place] [facility] Delles, Gentry Fitz A, RPH-CPP   1 year ago Type 2 diabetes mellitus with hyperglycemia, without long-term current use of insulin [facility])   [place] [facility] [place], Salvadore Oxford, NP   1 year ago Primary hypertension   [place] [facility] Delles, Jackelyn Poling, RPH-CPP   1 year ago Primary hypertension   [place] [facility] Delles, Gentry Fitz A, RPH-CPP

## 2023-03-03 ENCOUNTER — Other Ambulatory Visit: Payer: Self-pay

## 2023-03-08 ENCOUNTER — Other Ambulatory Visit: Payer: Self-pay

## 2023-03-23 ENCOUNTER — Other Ambulatory Visit (HOSPITAL_COMMUNITY): Payer: Self-pay

## 2023-04-06 ENCOUNTER — Other Ambulatory Visit: Payer: Self-pay

## 2023-04-06 ENCOUNTER — Telehealth: Payer: Self-pay

## 2023-04-06 NOTE — Telephone Encounter (Signed)
If she requesting a refill or asking to go up on her dose?  We can go up on the dose now as long as she is not experiencing hypoglycemia.

## 2023-04-06 NOTE — Telephone Encounter (Signed)
Copied from CRM [id number]. Topic: General - Other >> Apr 06, 2023 11:19 AM Turkey B wrote: Reason for CRM: pt called in wants to know when she should schedule to get this next round of mounjaro

## 2023-04-07 ENCOUNTER — Other Ambulatory Visit: Payer: Self-pay

## 2023-04-07 MED ORDER — TIRZEPATIDE 7.5 MG/0.5ML ~~LOC~~ SOAJ
7.5000 mg | SUBCUTANEOUS | 0 refills | Status: DC
  Filled 2023-04-07: qty 2, 28d supply, fill #0
  Filled 2023-05-03: qty 2, 28d supply, fill #1
  Filled 2023-06-15 – 2023-06-30 (×2): qty 2, 28d supply, fill #2

## 2023-04-07 NOTE — Telephone Encounter (Signed)
Mounjaro 7.5mg sent to pharmacy

## 2023-04-07 NOTE — Addendum Note (Signed)
Addended by: Lorre Munroe on: 04/07/2023 09:16 AM   Modules accepted: Orders

## 2023-04-08 ENCOUNTER — Other Ambulatory Visit: Payer: Self-pay

## 2023-04-20 ENCOUNTER — Telehealth: Payer: Self-pay

## 2023-04-20 ENCOUNTER — Encounter: Payer: Self-pay | Admitting: Podiatry

## 2023-04-20 ENCOUNTER — Ambulatory Visit (INDEPENDENT_AMBULATORY_CARE_PROVIDER_SITE_OTHER): Payer: 59 | Admitting: Podiatry

## 2023-04-20 VITALS — Ht 64.0 in | Wt 265.0 lb

## 2023-04-20 DIAGNOSIS — M79674 Pain in right toe(s): Secondary | ICD-10-CM

## 2023-04-20 DIAGNOSIS — B351 Tinea unguium: Secondary | ICD-10-CM | POA: Diagnosis not present

## 2023-04-20 DIAGNOSIS — M79675 Pain in left toe(s): Secondary | ICD-10-CM | POA: Diagnosis not present

## 2023-04-20 DIAGNOSIS — E119 Type 2 diabetes mellitus without complications: Secondary | ICD-10-CM

## 2023-04-20 NOTE — Progress Notes (Signed)
   Chief Complaint  Patient presents with   Diabetes    Patient is here for routine [facility]    SUBJECTIVE Patient with a history of diabetes mellitus presents to office today complaining of elongated, thickened nails that cause pain while ambulating in shoes.  Patient is unable to trim their own nails. Patient is here for further evaluation and treatment.  Past Medical History:  Diagnosis Date   Diabetes mellitus without complication (HCC)    Hypertension     No Known Allergies   OBJECTIVE General Patient is awake, alert, and oriented x 3 and in no acute distress. Derm Skin is dry and supple bilateral. Negative open lesions or macerations. Remaining integument unremarkable. Nails are tender, long, thickened and dystrophic with subungual debris, consistent with onychomycosis, 1-5 bilateral. No signs of infection noted. Vasc  DP and PT pedal pulses palpable bilaterally. Temperature gradient within normal limits.  Neuro Epicritic and protective threshold sensation diminished bilaterally.  Musculoskeletal Exam No symptomatic pedal deformities noted bilateral. Muscular strength within normal limits.  ASSESSMENT 1. Diabetes Mellitus w/ peripheral neuropathy 2.  Pain due to onychomycosis of toenails bilateral  PLAN OF CARE 1. Patient evaluated today.  Comprehensive diabetic foot exam performed today 2. Instructed to maintain good pedal hygiene and foot care. Stressed importance of controlling blood sugar.  3. Mechanical debridement of nails 1-5 bilaterally performed using a nail nipper. Filed with dremel without incident.  4. Return to clinic in 3 mos.     Felecia Shelling, DPM [facility]  Dr. Felecia Shelling, DPM    [address] [place], [place] [address]                Office [phone]  Fax [phone]

## 2023-04-20 NOTE — Telephone Encounter (Signed)
Patient called in inquiring about a follow up appointment with [facility] Scheduled patient on 06/03/2023 for her 6 month follow up. Patient aware of date and time.

## 2023-04-22 ENCOUNTER — Other Ambulatory Visit: Payer: Self-pay

## 2023-06-03 ENCOUNTER — Ambulatory Visit: Payer: 59 | Admitting: Internal Medicine

## 2023-06-03 ENCOUNTER — Encounter: Payer: Self-pay | Admitting: Internal Medicine

## 2023-06-03 ENCOUNTER — Other Ambulatory Visit: Payer: Self-pay

## 2023-06-03 VITALS — BP 132/82 | Ht 64.0 in | Wt 251.2 lb

## 2023-06-03 DIAGNOSIS — I10 Essential (primary) hypertension: Secondary | ICD-10-CM

## 2023-06-03 DIAGNOSIS — D500 Iron deficiency anemia secondary to blood loss (chronic): Secondary | ICD-10-CM | POA: Diagnosis not present

## 2023-06-03 DIAGNOSIS — E1169 Type 2 diabetes mellitus with other specified complication: Secondary | ICD-10-CM

## 2023-06-03 DIAGNOSIS — E785 Hyperlipidemia, unspecified: Secondary | ICD-10-CM | POA: Diagnosis not present

## 2023-06-03 DIAGNOSIS — Z6841 Body Mass Index (BMI) 40.0 and over, adult: Secondary | ICD-10-CM

## 2023-06-03 DIAGNOSIS — E66813 Obesity, class 3: Secondary | ICD-10-CM

## 2023-06-03 DIAGNOSIS — E1165 Type 2 diabetes mellitus with hyperglycemia: Secondary | ICD-10-CM

## 2023-06-03 LAB — POCT GLYCOSYLATED HEMOGLOBIN (HGB A1C): Hemoglobin A1C: 5.5 % (ref 4.0–5.6)

## 2023-06-03 MED ORDER — AMLODIPINE BESYLATE 10 MG PO TABS
10.0000 mg | ORAL_TABLET | Freq: Every day | ORAL | 1 refills | Status: DC
  Filled 2023-06-03: qty 90, 90d supply, fill #0
  Filled 2023-09-20: qty 90, 90d supply, fill #1

## 2023-06-03 NOTE — Patient Instructions (Addendum)

## 2023-06-03 NOTE — Assessment & Plan Note (Signed)
C-Met and lipid profile today Encouraged her to consume a low-fat diet Continue simvastatin 

## 2023-06-03 NOTE — Progress Notes (Signed)
Subjective:    Patient ID: Kathy [name], female    DOB: 02/08/1976, 47 y.o.   MRN: [id number]  HPI  Pt presents to the clinic today for 6 month follow up of chronic conditions.  HTN: Her BP today is 132/82. She is taking losartan, amlodipine and spironolactone as prescribed. ECG from 01/2015 reviewed.  HLD: Her last LDL was 72, triglycerides 782, 11/2022.  She denies myalgias on simvastatin.  She does not consume a low-fat diet.  DM2: Her last A1c was 7%, 11/2022.  She does not check her sugars.  She is taking glipizide and mounjaro as prescribed.  She checks her feet routinely.  Her last eye exam was 10/2021.  Flu 03/2023.  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  Transport planner.  Anemia: Her last H/H was 11.1/35.3, 11/2022.  She is not taking oral iron at this time.  She does not follow with hematology.  Review of Systems     Past Medical History:  Diagnosis Date   Diabetes mellitus without complication (HCC)    Hypertension     Current Outpatient Medications  Medication Sig Dispense Refill   amLODipine (NORVASC) 10 MG tablet Take 1 tablet (10 mg total) by mouth daily. 90 tablet 0   Blood Glucose Monitoring Suppl (BLOOD GLUCOSE MONITOR SYSTEM) w/Device KIT Use to check blood sugar daily as directed. 1 kit 0   fluticasone (FLONASE) 50 MCG/ACT nasal spray Place 2 sprays into both nostrils daily. 48 g 0   glipiZIDE (GLUCOTROL XL) 5 MG 24 hr tablet Take 1 tablet (5 mg total) by mouth daily with breakfast. 90 tablet 1   glucose blood test strip Use to check blood sugar daily as directed 100 each 0   Iron, Ferrous Sulfate, 325 (65 Fe) MG TABS Take 325 mg by mouth daily. 90 tablet 1   Lancets (FREESTYLE) lancets Use to check blood sugar daily as directed 100 each 0   losartan (COZAAR) 100 MG tablet Take 1 tablet (100 mg total) by mouth daily. 90 tablet 0   simvastatin (ZOCOR) 20 MG tablet Take 1 tablet (20 mg total) by mouth at bedtime. 90 tablet 3   spironolactone (ALDACTONE) 25 MG tablet TAKE 1  TABLET (25 MG TOTAL) BY MOUTH DAILY. 90 tablet 1   tirzepatide (MOUNJARO) 7.5 MG/0.5ML Pen Inject 7.5 mg into the skin once a week. 6 mL 0   No current facility-administered medications for this visit.    No Known Allergies  Family History  Problem Relation Age of Onset   Hypertension Mother    Colon polyps Mother    Breast cancer Paternal Grandmother    Diabetes Maternal Aunt     Social History   Socioeconomic History   Marital status: Single    Spouse name: Not on file   Number of children: Not on file   Years of education: Not on file   Highest education level: Not on file  Occupational History   Not on file  Tobacco Use   Smoking status: Never   Smokeless tobacco: Never  Vaping Use   Vaping status: Never Used  Substance and Sexual Activity   Alcohol use: Yes    Alcohol/week: 0.0 standard drinks of alcohol    Comment: rare   Drug use: No   Sexual activity: Yes    Birth control/protection: Surgical  Other Topics Concern   Not on file  Social History Narrative   Not on file   Social Drivers of Health   Financial Resource Strain: Not  on file  Food Insecurity: Not on file  Transportation Needs: Not on file  Physical Activity: Not on file  Stress: Not on file  Social Connections: Not on file  Intimate Partner Violence: Not on file     Constitutional: Denies fever, fatigue, malaise, headache or abrupt weight changes.  HEENT: Denies eye pain, eye redness, ear pain, ringing in the ears, wax buildup, runny nose, nasal congestion, bloody nose, or sore throat. Respiratory: Denies difficulty breathing, shortness of breath, cough or sputum production.   Cardiovascular: Denies chest pain, chest tightness, palpitations or swelling in the hands and feet.  Gastrointestinal: Patient reports constipation.  Denies abdominal pain, bloating, diarrhea or blood in the stool.  GU: Denies urgency, frequency, pain with urination, burning sensation, blood in urine, odor or  discharge. Musculoskeletal: Denies decrease in range of motion, difficulty with gait, muscle pain or joint pain and swelling.  Skin: Denies redness, rashes, lesions or ulcercations.  Neurological: Denies dizziness, difficulty with memory, difficulty with speech or problems with balance and coordination.  Psych: Denies anxiety, depression, SI/HI.  No other specific complaints in a complete review of systems (except as listed in HPI above).  Objective:   Physical Exam  BP 132/82 (BP Location: Left Arm, Patient Position: Sitting, Cuff Size: Large)   Ht 5\' 4"  (1.626 m)   Wt 251 lb 3.2 oz (113.9 kg)   LMP 05/09/2023 (Approximate)   BMI 43.12 kg/m    Wt Readings from Last 3 Encounters:  04/20/23 265 lb (120.2 kg)  12/01/22 265 lb 12.8 oz (120.6 kg)  11/26/22 270 lb (122.5 kg)    [name]: Appears her stated age, obese, in NAD. Skin: Warm, dry and intact. No ulcerations noted. HEENT: Head: normal shape and size; Eyes: sclera white, no icterus, conjunctiva pink, PERRLA and EOMs intact;  Cardiovascular: Normal rate and rhythm. S1,S2 noted.  No murmur, rubs or gallops noted.  Trace pitting  BLE edema.  Pulmonary/Chest: Normal effort and positive vesicular breath sounds. No respiratory distress. No wheezes, rales or ronchi noted.  Musculoskeletal: No difficulty with gait.  Neurological: Alert and oriented.     BMET    Component Value Date/Time   NA 139 11/26/2022 0931   K 3.9 11/26/2022 0931   CL 104 11/26/2022 0931   CO2 27 11/26/2022 0931   GLUCOSE 111 (H) 11/26/2022 0931   BUN 9 11/26/2022 0931   CREATININE 0.64 11/26/2022 0931   CALCIUM 8.8 11/26/2022 0931   GFRNONAA 108 12/04/2020 0757   GFRAA 125 12/04/2020 0757    Lipid Panel     Component Value Date/Time   CHOL 129 11/26/2022 0931   TRIG 111 11/26/2022 0931   HDL 38 (L) 11/26/2022 0931   CHOLHDL 3.4 11/26/2022 0931   VLDL 55.4 (H) 08/28/2020 1507   LDLCALC 72 11/26/2022 0931    CBC    Component Value  Date/Time   WBC 4.0 11/26/2022 0931   RBC 4.48 11/26/2022 0931   HGB 11.0 (L) 11/26/2022 0931   HCT 35.3 11/26/2022 0931   PLT 259 11/26/2022 0931   MCV 78.8 (L) 11/26/2022 0931   MCH 24.6 (L) 11/26/2022 0931   MCHC 31.2 (L) 11/26/2022 0931   RDW 17.0 (H) 11/26/2022 0931    Hgb A1C Lab Results  Component Value Date   HGBA1C 7.0 (H) 11/26/2022           Assessment & Plan:      RTC in 6 months for your annual exam Nicki Reaper, NP

## 2023-06-03 NOTE — Assessment & Plan Note (Signed)
POCT A1c 5.5% Urine microalbumin has been checked within the last year Encouraged her to consume a low-carb diet and exercise weight loss Continue glipizide and Mounjaro Advised her to schedule an appointment for an eye exam Immunizations UTD Encouraged routine foot exams

## 2023-06-03 NOTE — Assessment & Plan Note (Signed)
Encourage diet and exercise for weight loss 

## 2023-06-03 NOTE — Assessment & Plan Note (Signed)
Controlled on losartan, amlodipine and spironolactone Reinforced DASH diet and exercise for weight loss C-Met today

## 2023-06-03 NOTE — Assessment & Plan Note (Signed)
CBC and iron panel today Not taking iron

## 2023-06-04 LAB — COMPLETE METABOLIC PANEL WITH GFR
AG Ratio: 0.9 (calc) — ABNORMAL LOW (ref 1.0–2.5)
ALT: 14 U/L (ref 6–29)
AST: 14 U/L (ref 10–35)
Albumin: 3.7 g/dL (ref 3.6–5.1)
Alkaline phosphatase (APISO): 79 U/L (ref 31–125)
BUN: 14 mg/dL (ref 7–25)
CO2: 27 mmol/L (ref 20–32)
Calcium: 9.2 mg/dL (ref 8.6–10.2)
Chloride: 104 mmol/L (ref 98–110)
Creat: 0.86 mg/dL (ref 0.50–0.99)
Globulin: 4 g/dL — ABNORMAL HIGH (ref 1.9–3.7)
Glucose, Bld: 89 mg/dL (ref 65–99)
Potassium: 3.9 mmol/L (ref 3.5–5.3)
Sodium: 138 mmol/L (ref 135–146)
Total Bilirubin: 0.5 mg/dL (ref 0.2–1.2)
Total Protein: 7.7 g/dL (ref 6.1–8.1)
eGFR: 85 mL/min/{1.73_m2} (ref >=60)

## 2023-06-04 LAB — CBC
HCT: 36.3 % (ref 35.0–45.0)
Hemoglobin: 11.4 g/dL — ABNORMAL LOW (ref 11.7–15.5)
MCH: 26.1 pg — ABNORMAL LOW (ref 27.0–33.0)
MCHC: 31.4 g/dL — ABNORMAL LOW (ref 32.0–36.0)
MCV: 83.3 fL (ref 80.0–100.0)
MPV: 11.3 fL (ref 7.5–12.5)
Platelets: 270 10*3/uL (ref 140–400)
RBC: 4.36 10*6/uL (ref 3.80–5.10)
RDW: 16.9 % — ABNORMAL HIGH (ref 11.0–15.0)
WBC: 3.7 10*3/uL — ABNORMAL LOW (ref 3.8–10.8)

## 2023-06-04 LAB — IRON,TIBC AND FERRITIN PANEL
%SAT: 10 % — ABNORMAL LOW (ref 16–45)
Ferritin: 10 ng/mL — ABNORMAL LOW (ref 16–232)
Iron: 38 ug/dL — ABNORMAL LOW (ref 40–190)
TIBC: 399 ug/dL (ref 250–450)

## 2023-06-04 LAB — LIPID PANEL
Cholesterol: 139 mg/dL (ref <200)
HDL: 34 mg/dL — ABNORMAL LOW (ref >=50)
LDL Cholesterol (Calc): 83 mg/dL
Non-HDL Cholesterol (Calc): 105 mg/dL (ref <130)
Total CHOL/HDL Ratio: 4.1 (calc) (ref <5.0)
Triglycerides: 120 mg/dL (ref <150)

## 2023-06-14 ENCOUNTER — Telehealth (INDEPENDENT_AMBULATORY_CARE_PROVIDER_SITE_OTHER): Payer: 59 | Admitting: Family Medicine

## 2023-06-14 ENCOUNTER — Ambulatory Visit: Payer: Self-pay

## 2023-06-14 ENCOUNTER — Encounter: Payer: Self-pay | Admitting: Family Medicine

## 2023-06-14 DIAGNOSIS — J069 Acute upper respiratory infection, unspecified: Secondary | ICD-10-CM

## 2023-06-14 DIAGNOSIS — J0110 Acute frontal sinusitis, unspecified: Secondary | ICD-10-CM

## 2023-06-14 NOTE — Progress Notes (Signed)
 Appointment missed. Acute mychart video visit added on to schedule within 1 hour of appointment time and provider unaware of add on. Attempted phone calls after hours and mychart message sent. Will address her concern on Tues 06/15/23. No new apt needed.  Marsa Officer, DO [facility] 06/14/2023, 7:23 PM

## 2023-06-14 NOTE — Telephone Encounter (Signed)
 Summary: constant coughing   Patient is experiencing coughing spells with clear mucus. Eyes, nose and throat pain. She is requesting something for the symptoms. Please f/u with patient     Chief Complaint: Productive, continuous cough. Sore throat. Symptoms: Above Frequency: Saturday Pertinent Negatives: Patient denies fever Disposition: [] ED /[] Urgent Care (no appt availability in office) / [x] Appointment(In office/virtual)/ []  [place] [facility]/ [] Home Care/ [] Refused Recommended Disposition /[] [facility]/ []  Follow-up with PCP Additional Notes: Agrees with appointment.  Reason for Disposition  [1] MILD difficulty breathing (e.g., minimal/no SOB at rest, SOB with walking, pulse <100) AND [2] still present when not coughing  Answer Assessment - Initial Assessment Questions 1. ONSET: When did the cough begin?      Saturday 2. SEVERITY: How bad is the cough today?      Severe 3. SPUTUM: Describe the color of your sputum (none, dry cough; clear, white, yellow, green)     clear 4. HEMOPTYSIS: Are you coughing up any blood? If so ask: How much? (flecks, streaks, tablespoons, etc.)     No 5. DIFFICULTY BREATHING: Are you having difficulty breathing? If Yes, ask: How bad is it? (e.g., mild, moderate, severe)    - MILD: No SOB at rest, mild SOB with walking, speaks normally in sentences, can lie down, no retractions, pulse < 100.    - MODERATE: SOB at rest, SOB with minimal exertion and prefers to sit, cannot lie down flat, speaks in phrases, mild retractions, audible wheezing, pulse 100-120.    - SEVERE: Very SOB at rest, speaks in single words, struggling to breathe, sitting hunched forward, retractions, pulse > 120      Mild 6. FEVER: Do you have a fever? If Yes, ask: What is your temperature, how was it measured, and when did it start?     No 7. CARDIAC HISTORY: Do you have any history of heart disease? (e.g., heart attack, congestive heart failure)       No 8. LUNG HISTORY: Do you have any history of lung disease?  (e.g., pulmonary embolus, asthma, emphysema)     No 9. PE RISK FACTORS: Do you have a history of blood clots? (or: recent major surgery, recent prolonged travel, bedridden)     No 10. OTHER SYMPTOMS: Do you have any other symptoms? (e.g., runny nose, wheezing, chest pain)       Sore throat 11. PREGNANCY: Is there any chance you are pregnant? When was your last menstrual period?       No 12. TRAVEL: Have you traveled out of the country in the last month? (e.g., travel history, exposures)       No  Protocols used: Cough - Acute Productive-A-AH

## 2023-06-15 ENCOUNTER — Other Ambulatory Visit: Payer: Self-pay

## 2023-06-15 MED ORDER — HYDROCOD POLI-CHLORPHE POLI ER 10-8 MG/5ML PO SUER
5.0000 mL | Freq: Two times a day (BID) | ORAL | 0 refills | Status: DC | PRN
  Filled 2023-06-15: qty 115, 12d supply, fill #0

## 2023-06-15 MED ORDER — IPRATROPIUM BROMIDE 0.06 % NA SOLN
2.0000 | Freq: Four times a day (QID) | NASAL | 0 refills | Status: DC
Start: 2023-06-15 — End: 2023-12-02
  Filled 2023-06-15: qty 15, 19d supply, fill #0

## 2023-06-15 MED ORDER — AZITHROMYCIN 250 MG PO TABS
ORAL_TABLET | ORAL | 0 refills | Status: DC
Start: 2023-06-15 — End: 2023-12-02
  Filled 2023-06-15: qty 6, 5d supply, fill #0

## 2023-06-15 MED ORDER — BENZONATATE 100 MG PO CAPS
100.0000 mg | ORAL_CAPSULE | Freq: Three times a day (TID) | ORAL | 0 refills | Status: DC | PRN
  Filled 2023-06-15: qty 30, 10d supply, fill #0

## 2023-06-29 ENCOUNTER — Other Ambulatory Visit: Payer: Self-pay

## 2023-06-30 ENCOUNTER — Other Ambulatory Visit: Payer: Self-pay | Admitting: Internal Medicine

## 2023-06-30 ENCOUNTER — Other Ambulatory Visit: Payer: Self-pay

## 2023-06-30 MED ORDER — LOSARTAN POTASSIUM 100 MG PO TABS
100.0000 mg | ORAL_TABLET | Freq: Every day | ORAL | 1 refills | Status: DC
  Filled 2023-06-30: qty 90, 90d supply, fill #0
  Filled 2023-09-20: qty 90, 90d supply, fill #1

## 2023-06-30 NOTE — Telephone Encounter (Signed)
Requested Prescriptions  Pending Prescriptions Disp Refills   losartan (COZAAR) 100 MG tablet 90 tablet 0    Sig: Take 1 tablet (100 mg total) by mouth daily.     Cardiovascular:  Angiotensin Receptor Blockers Passed - 06/30/2023  2:56 PM      Passed - Cr in normal range and within 180 days    Creat  Date Value Ref Range Status  06/03/2023 0.86 0.50 - 0.99 mg/dL Final   Creatinine, Urine  Date Value Ref Range Status  11/26/2022 120 20 - 275 mg/dL Final         Passed - K in normal range and within 180 days    Potassium  Date Value Ref Range Status  06/03/2023 3.9 3.5 - 5.3 mmol/L Final         Passed - Patient is not pregnant      Passed - Last BP in normal range    BP Readings from Last 1 Encounters:  06/03/23 132/82         Passed - Valid encounter within last 6 months    Recent Outpatient Visits           2 weeks ago Upper respiratory tract infection, unspecified type   [place] [facility] Smitty Cords, DO   3 weeks ago Type 2 diabetes mellitus with hyperglycemia, without long-term current use of insulin [facility])   [place] [facility] [place], Salvadore Oxford, NP   7 months ago Encounter for general adult medical examination with abnormal findings   [place] [facility] [place], Salvadore Oxford, NP   1 year ago Primary hypertension   [place] [facility] Delles, Gentry Fitz A, RPH-CPP   1 year ago Type 2 diabetes mellitus with hyperglycemia, without long-term current use of insulin [facility])   [place] [facility] [place], Salvadore Oxford, NP       Future Appointments             In 5 months Baity, Salvadore Oxford, NP [place] [facility], [facility]

## 2023-08-06 ENCOUNTER — Other Ambulatory Visit: Payer: Self-pay

## 2023-08-06 ENCOUNTER — Other Ambulatory Visit: Payer: Self-pay | Admitting: Internal Medicine

## 2023-08-06 DIAGNOSIS — E119 Type 2 diabetes mellitus without complications: Secondary | ICD-10-CM

## 2023-08-09 ENCOUNTER — Other Ambulatory Visit: Payer: Self-pay

## 2023-08-09 MED FILL — Glipizide Tab ER 24HR 5 MG: ORAL | 90 days supply | Qty: 90 | Fill #0 | Status: AC

## 2023-08-09 MED FILL — Tirzepatide Soln Auto-injector 7.5 MG/0.5ML: SUBCUTANEOUS | 84 days supply | Qty: 6 | Fill #0 | Status: AC

## 2023-08-09 NOTE — Telephone Encounter (Signed)
 Requested medication (s) are due for refill today: yes  Requested medication (s) are on the active medication list: yes  Last refill:  04/07/23  Future visit scheduled: yes  Notes to clinic:   Medication not assigned to a protocol, review manually.      Requested Prescriptions  Pending Prescriptions Disp Refills   MOUNJARO 7.5 MG/0.5ML Pen [Pharmacy Med Name: tirzepatide (MOUNJARO) 7.5 MG/0.5ML Pen] 6 mL 0    Sig: Inject 7.5 mg into the skin once a week.     Off-Protocol Failed - 08/09/2023 12:46 PM      Failed - Medication not assigned to a protocol, review manually.      Passed - Valid encounter within last 12 months    Recent Outpatient Visits           1 month ago Upper respiratory tract infection, unspecified type   [place] [facility] [place], Netta Neat, DO   2 months ago Type 2 diabetes mellitus with hyperglycemia, without long-term current use of insulin [facility])   [place] [facility] [place], [place], NP   8 months ago Encounter for general adult medical examination with abnormal findings   [place] [facility] [place], Salvadore Oxford, NP   1 year ago Primary hypertension   [place] [facility] Delles, Gentry Fitz A, RPH-CPP   1 year ago Type 2 diabetes mellitus with hyperglycemia, without long-term current use of insulin (HCC)   [place] [facility] [place], Salvadore Oxford, NP       Future Appointments             In 3 months Baity, Salvadore Oxford, NP [place] [facility], [facility]            Signed Prescriptions Disp Refills   glipiZIDE (GLUCOTROL XL) 5 MG 24 hr tablet 90 tablet 0    Sig: Take 1 tablet (5 mg total) by mouth daily with breakfast.     Endocrinology:  Diabetes - Sulfonylureas Passed - 08/09/2023 12:46 PM      Passed - HBA1C is between 0 and 7.9 and within 180 days    Hemoglobin A1C  Date Value Ref Range Status  06/03/2023 5.5 4.0 - 5.6 % Final    HbA1c, POC (controlled diabetic range)  Date Value Ref Range Status  01/29/2022 6.8 0.0 - 7.0 % Final   Hgb A1c MFr Bld  Date Value Ref Range Status  11/26/2022 7.0 (H) <5.7 % of total Hgb Final    Comment:    For someone without known diabetes, a hemoglobin A1c value of 6.5% or greater indicates that they may have  diabetes and this should be confirmed with a follow-up  test. . For someone with known diabetes, a value <7% indicates  that their diabetes is well controlled and a value  greater than or equal to 7% indicates suboptimal  control. A1c targets should be individualized based on  duration of diabetes, age, comorbid conditions, and  other considerations. . Currently, no consensus exists regarding use of hemoglobin A1c for diagnosis of diabetes for children. .          Passed - Cr in normal range and within 360 days    Creat  Date Value Ref Range Status  06/03/2023 0.86 0.50 - 0.99 mg/dL Final   Creatinine, Urine  Date Value Ref Range Status  11/26/2022 120 20 - 275 mg/dL Final  Passed - Valid encounter within last 6 months    Recent Outpatient Visits           1 month ago Upper respiratory tract infection, unspecified type   [place] [facility] [place], Netta Neat, DO   2 months ago Type 2 diabetes mellitus with hyperglycemia, without long-term current use of insulin [facility])   [place] [facility] [place], Salvadore Oxford, NP   8 months ago Encounter for general adult medical examination with abnormal findings   [place] [facility] [place], Salvadore Oxford, NP   1 year ago Primary hypertension   [place] [facility] Delles, Gentry Fitz A, RPH-CPP   1 year ago Type 2 diabetes mellitus with hyperglycemia, without long-term current use of insulin [facility])   [place] [facility] [place], Salvadore Oxford, NP       Future Appointments             In 3 months Baity, Salvadore Oxford, NP  [place] [facility], [facility]

## 2023-08-09 NOTE — Telephone Encounter (Signed)
 Requested Prescriptions  Pending Prescriptions Disp Refills   glipiZIDE (GLUCOTROL XL) 5 MG 24 hr tablet 90 tablet 0    Sig: Take 1 tablet (5 mg total) by mouth daily with breakfast.     Endocrinology:  Diabetes - Sulfonylureas Passed - 08/09/2023 12:43 PM      Passed - HBA1C is between 0 and 7.9 and within 180 days    Hemoglobin A1C  Date Value Ref Range Status  06/03/2023 5.5 4.0 - 5.6 % Final   HbA1c, POC (controlled diabetic range)  Date Value Ref Range Status  01/29/2022 6.8 0.0 - 7.0 % Final   Hgb A1c MFr Bld  Date Value Ref Range Status  11/26/2022 7.0 (H) <5.7 % of total Hgb Final    Comment:    For someone without known diabetes, a hemoglobin A1c value of 6.5% or greater indicates that they may have  diabetes and this should be confirmed with a follow-up  test. . For someone with known diabetes, a value <7% indicates  that their diabetes is well controlled and a value  greater than or equal to 7% indicates suboptimal  control. A1c targets should be individualized based on  duration of diabetes, age, comorbid conditions, and  other considerations. . Currently, no consensus exists regarding use of hemoglobin A1c for diagnosis of diabetes for children. .          Passed - Cr in normal range and within 360 days    Creat  Date Value Ref Range Status  06/03/2023 0.86 0.50 - 0.99 mg/dL Final   Creatinine, Urine  Date Value Ref Range Status  11/26/2022 120 20 - 275 mg/dL Final         Passed - Valid encounter within last 6 months    Recent Outpatient Visits           1 month ago Upper respiratory tract infection, unspecified type   [place] [facility] [place], Netta Neat, DO   2 months ago Type 2 diabetes mellitus with hyperglycemia, without long-term current use of insulin [facility])   [place] [facility] [place], Salvadore Oxford, NP   8 months ago Encounter for general adult medical examination with abnormal findings   [facility] [place], Salvadore Oxford, NP   1 year ago Primary hypertension   [place] [facility] Delles, Gentry Fitz A, RPH-CPP   1 year ago Type 2 diabetes mellitus with hyperglycemia, without long-term current use of insulin [facility])   [place] [facility] [place], Salvadore Oxford, NP       Future Appointments             In 3 months Baity, Salvadore Oxford, NP [place] [facility], [facility]             MOUNJARO 7.5 MG/0.5ML Pen [Pharmacy Med Name: tirzepatide [facility]) 7.5 MG/0.5ML Pen] 6 mL 0    Sig: Inject 7.5 mg into the skin once a week.     Off-Protocol Failed - 08/09/2023 12:43 PM      Failed - Medication not assigned to a protocol, review manually.      Passed - Valid encounter within last 12 months    Recent Outpatient Visits           1 month ago Upper respiratory tract infection, unspecified type    [facility] [place], Netta Neat, DO   2 months ago Type  2 diabetes mellitus with hyperglycemia, without long-term current use of insulin [facility])   [place] [facility] [place], Salvadore Oxford, NP   8 months ago Encounter for general adult medical examination with abnormal findings   [place] [facility] [place], Salvadore Oxford, NP   1 year ago Primary hypertension   [place] [facility] Delles, Gentry Fitz A, RPH-CPP   1 year ago Type 2 diabetes mellitus with hyperglycemia, without long-term current use of insulin [facility])   [place] [facility] [place], Salvadore Oxford, NP       Future Appointments             In 3 months Baity, Salvadore Oxford, NP [place] [facility], [facility]

## 2023-09-20 ENCOUNTER — Other Ambulatory Visit: Payer: Self-pay | Admitting: Internal Medicine

## 2023-09-20 ENCOUNTER — Other Ambulatory Visit: Payer: Self-pay

## 2023-09-21 ENCOUNTER — Other Ambulatory Visit: Payer: Self-pay

## 2023-09-21 MED FILL — Spironolactone Tab 25 MG: ORAL | 90 days supply | Qty: 90 | Fill #0 | Status: AC

## 2023-09-21 NOTE — Telephone Encounter (Signed)
 Requested by interface surescripts. Future visit in 2 months.  Requested Prescriptions  Pending Prescriptions Disp Refills   spironolactone (ALDACTONE) 25 MG tablet 90 tablet 0    Sig: TAKE 1 TABLET (25 MG TOTAL) BY MOUTH DAILY.     Cardiovascular: Diuretics - Aldosterone Antagonist Failed - 09/21/2023  3:43 PM      Failed - Valid encounter within last 6 months    Recent Outpatient Visits   None     Future Appointments             In 2 months Baity, Rankin Buzzard, NP [place] [facility], [facility]            Passed - Cr in normal range and within 180 days    Creat  Date Value Ref Range Status  06/03/2023 0.86 0.50 - 0.99 mg/dL Final   Creatinine, Urine  Date Value Ref Range Status  11/26/2022 120 20 - 275 mg/dL Final         Passed - K in normal range and within 180 days    Potassium  Date Value Ref Range Status  06/03/2023 3.9 3.5 - 5.3 mmol/L Final         Passed - Na in normal range and within 180 days    Sodium  Date Value Ref Range Status  06/03/2023 138 135 - 146 mmol/L Final         Passed - eGFR is 30 or above and within 180 days    GFR, Est African American  Date Value Ref Range Status  12/04/2020 125 > OR = 60 mL/min/1.29m2 Final   GFR, Est Non African American  Date Value Ref Range Status  12/04/2020 108 > OR = 60 mL/min/1.62m2 Final   GFR  Date Value Ref Range Status  09/17/2020 106.57 >60.00 mL/min Final    Comment:    Calculated using the CKD-EPI Creatinine Equation (2021)   eGFR  Date Value Ref Range Status  06/03/2023 85 > OR = 60 mL/min/1.62m2 Final         Passed - Last BP in normal range    BP Readings from Last 1 Encounters:  06/03/23 132/82

## 2023-10-28 ENCOUNTER — Other Ambulatory Visit: Payer: Self-pay | Admitting: Internal Medicine

## 2023-10-28 ENCOUNTER — Other Ambulatory Visit: Payer: Self-pay

## 2023-10-28 DIAGNOSIS — E119 Type 2 diabetes mellitus without complications: Secondary | ICD-10-CM

## 2023-10-29 ENCOUNTER — Other Ambulatory Visit: Payer: Self-pay

## 2023-10-29 MED FILL — Tirzepatide Soln Auto-injector 7.5 MG/0.5ML: SUBCUTANEOUS | 28 days supply | Qty: 2 | Fill #0 | Status: AC

## 2023-10-29 NOTE — Telephone Encounter (Signed)
 Requested Prescriptions  Pending Prescriptions Disp Refills   tirzepatide  (MOUNJARO ) 7.5 MG/0.5ML Pen 2 mL 2    Sig: Inject 7.5 mg into the skin once a week.     Off-Protocol Failed - 10/29/2023  4:54 PM      Failed - Medication not assigned to a protocol, review manually.      Failed - Valid encounter within last 12 months    Recent Outpatient Visits   None     Future Appointments             In 1 month Baity, Rankin Buzzard, NP [place] [facility], [facility]

## 2023-10-31 ENCOUNTER — Other Ambulatory Visit: Payer: Self-pay | Admitting: Internal Medicine

## 2023-11-02 ENCOUNTER — Other Ambulatory Visit: Payer: Self-pay

## 2023-11-03 ENCOUNTER — Other Ambulatory Visit: Payer: Self-pay

## 2023-11-03 MED ORDER — GLIPIZIDE ER 5 MG PO TB24
5.0000 mg | ORAL_TABLET | Freq: Every day | ORAL | 0 refills | Status: DC
  Filled 2023-11-03: qty 90, 90d supply, fill #0

## 2023-11-03 NOTE — Telephone Encounter (Signed)
 Requested Prescriptions  Pending Prescriptions Disp Refills   glipiZIDE  (GLUCOTROL  XL) 5 MG 24 hr tablet 90 tablet 0    Sig: Take 1 tablet (5 mg total) by mouth daily with breakfast.     Endocrinology:  Diabetes - Sulfonylureas Failed - 11/03/2023  3:40 PM      Failed - Valid encounter within last 6 months    Recent Outpatient Visits   None     Future Appointments             In 4 weeks Baity, Rankin Buzzard, NP [place] [facility], [facility]            Passed - HBA1C is between 0 and 7.9 and within 180 days    Hemoglobin A1C  Date Value Ref Range Status  06/03/2023 5.5 4.0 - 5.6 % Final   HbA1c, POC (controlled diabetic range)  Date Value Ref Range Status  01/29/2022 6.8 0.0 - 7.0 % Final   Hgb A1c MFr Bld  Date Value Ref Range Status  11/26/2022 7.0 (H) <5.7 % of total Hgb Final    Comment:    For someone without known diabetes, a hemoglobin A1c value of 6.5% or greater indicates that they may have  diabetes and this should be confirmed with a follow-up  test. . For someone with known diabetes, a value <7% indicates  that their diabetes is well controlled and a value  greater than or equal to 7% indicates suboptimal  control. A1c targets should be individualized based on  duration of diabetes, age, comorbid conditions, and  other considerations. . Currently, no consensus exists regarding use of hemoglobin A1c for diagnosis of diabetes for children. .          Passed - Cr in normal range and within 360 days    Creat  Date Value Ref Range Status  06/03/2023 0.86 0.50 - 0.99 mg/dL Final   Creatinine, Urine  Date Value Ref Range Status  11/26/2022 120 20 - 275 mg/dL Final

## 2023-12-01 ENCOUNTER — Ambulatory Visit: Payer: Self-pay | Admitting: Internal Medicine

## 2023-12-02 ENCOUNTER — Ambulatory Visit: Admitting: Internal Medicine

## 2023-12-02 ENCOUNTER — Encounter: Payer: Self-pay | Admitting: Internal Medicine

## 2023-12-02 VITALS — BP 124/78 | Ht 64.0 in | Wt 230.6 lb

## 2023-12-02 DIAGNOSIS — E1165 Type 2 diabetes mellitus with hyperglycemia: Secondary | ICD-10-CM | POA: Diagnosis not present

## 2023-12-02 DIAGNOSIS — Z0001 Encounter for general adult medical examination with abnormal findings: Secondary | ICD-10-CM

## 2023-12-02 DIAGNOSIS — Z1231 Encounter for screening mammogram for malignant neoplasm of breast: Secondary | ICD-10-CM | POA: Diagnosis not present

## 2023-12-02 DIAGNOSIS — Z6839 Body mass index (BMI) 39.0-39.9, adult: Secondary | ICD-10-CM | POA: Diagnosis not present

## 2023-12-02 DIAGNOSIS — Z23 Encounter for immunization: Secondary | ICD-10-CM

## 2023-12-02 DIAGNOSIS — Z7984 Long term (current) use of oral hypoglycemic drugs: Secondary | ICD-10-CM

## 2023-12-02 DIAGNOSIS — E66812 Obesity, class 2: Secondary | ICD-10-CM

## 2023-12-02 NOTE — Progress Notes (Signed)
 Subjective:    Patient ID: Kathy [name], female    DOB: [birth year], 48 y.o.   MRN: [id number]  HPI  Patient presents to clinic today for her annual exam.  Flu: 03/2023 Tetanus: 04/2016 COVID: Pfizer x 2 Pneumovax: 10/2019 Prevnar 20: never Pap smear: 11/2022 Mammogram: 01/2023 Colon screening: 11/2022 Vision screening: as needed Dentist: biannually  Diet: She does eat lean meat. She eats some fruits and veggies. She does eat some fried foods. She drinks mostly water and sweet tea. Exercise: Walking  Review of Systems     Past Medical History:  Diagnosis Date   Diabetes mellitus without complication (HCC)    Hypertension     Current Outpatient Medications  Medication Sig Dispense Refill   amLODipine  (NORVASC ) 10 MG tablet Take 1 tablet (10 mg total) by mouth daily. 90 tablet 1   azithromycin  (ZITHROMAX  Z-PAK) 250 MG tablet Take 2 tablets (500mg  total) by mouth on Day 1, then take 1 tablet (250mg ) daily for next 4 days. 6 tablet 0   benzonatate  (TESSALON ) 100 MG capsule Take 1 capsule (100 mg total) by mouth 3 (three) times daily as needed for cough. 30 capsule 0   Blood Glucose Monitoring Suppl (BLOOD GLUCOSE MONITOR SYSTEM) w/Device KIT Use to check blood sugar daily as directed. 1 kit 0   chlorpheniramine-HYDROcodone  (TUSSIONEX) 10-8 MG/5ML Take 5 mLs by mouth every 12 (twelve) hours as needed for cough. 115 mL 0   glipiZIDE  (GLUCOTROL  XL) 5 MG 24 hr tablet Take 1 tablet (5 mg total) by mouth daily with breakfast. 90 tablet 0   glucose blood test strip Use to check blood sugar daily as directed 100 each 0   ipratropium (ATROVENT ) 0.06 % nasal spray Place 2 sprays into both nostrils 4 (four) times daily for up to 5-7 days then stop. 15 mL 0   Iron , Ferrous Sulfate , 325 (65 Fe) MG TABS Take 325 mg by mouth daily. (Patient not taking: Reported on 06/03/2023) 90 tablet 1   Lancets (FREESTYLE) lancets Use to check blood sugar daily as directed 100 each 0   losartan   (COZAAR ) 100 MG tablet Take 1 tablet (100 mg total) by mouth daily. 90 tablet 1   simvastatin  (ZOCOR ) 20 MG tablet Take 1 tablet (20 mg total) by mouth at bedtime. 90 tablet 3   spironolactone  (ALDACTONE ) 25 MG tablet TAKE 1 TABLET (25 MG TOTAL) BY MOUTH DAILY. 90 tablet 0   tirzepatide  (MOUNJARO ) 7.5 MG/0.5ML Pen Inject 7.5 mg into the skin once a week. 2 mL 2   No current facility-administered medications for this visit.    No Known Allergies  Family History  Problem Relation Age of Onset   Hypertension Mother    Colon polyps Mother    Breast cancer Paternal Grandmother    Diabetes Maternal Aunt     Social History   Socioeconomic History   Marital status: Single    Spouse name: Not on file   Number of children: Not on file   Years of education: Not on file   Highest education level: Not on file  Occupational History   Not on file  Tobacco Use   Smoking status: Never   Smokeless tobacco: Never  Vaping Use   Vaping status: Never Used  Substance and Sexual Activity   Alcohol use: Yes    Alcohol/week: 0.0 standard drinks of alcohol    Comment: rare   Drug use: No   Sexual activity: Yes    Birth control/protection: Surgical  Other Topics Concern   Not on file  Social History Narrative   Not on file   Social Drivers of Health   Financial Resource Strain: Not on file  Food Insecurity: Not on file  Transportation Needs: Not on file  Physical Activity: Not on file  Stress: Not on file  Social Connections: Not on file  Intimate Partner Violence: Not on file     Constitutional: Denies fever, malaise, fatigue, headache or abrupt weight changes.  HEENT: Denies eye pain, eye redness, ear pain, ringing in the ears, wax buildup, runny nose, nasal congestion, bloody nose, or sore throat. Respiratory: Denies difficulty breathing, shortness of breath, cough or sputum production.   Cardiovascular: Denies chest pain, chest tightness, palpitations or swelling in the hands or  feet.  Gastrointestinal: Denies abdominal pain, bloating, constipation, diarrhea or blood in the stool.  GU: Pt reports right flank pain. Denies urgency, frequency, pain with urination, burning sensation, blood in urine, odor or discharge. Musculoskeletal: Denies decrease in range of motion, difficulty with gait, muscle pain or joint pain and swelling.  Skin: Denies redness, rashes, lesions or ulcercations.  Neurological: Denies dizziness, difficulty with memory, difficulty with speech or problems with balance and coordination.  Psych: Denies anxiety, depression, SI/HI.  No other specific complaints in a complete review of systems (except as listed in HPI above).  Objective:   Physical Exam  BP 124/78 (BP Location: Left Arm, Patient Position: Sitting, Cuff Size: Large)   Ht 5' 4 (1.626 m)   Wt 230 lb 9.6 oz (104.6 kg)   LMP 11/12/2023 (Approximate)   BMI 39.58 kg/m    Wt Readings from Last 3 Encounters:  06/03/23 251 lb 3.2 oz (113.9 kg)  04/20/23 265 lb (120.2 kg)  12/01/22 265 lb 12.8 oz (120.6 kg)    General: Appears her stated age, obese,in NAD. Skin: Warm, dry and intact.  HEENT: Head: normal shape and size; Eyes: sclera white, no icterus, conjunctiva pink, PERRLA and EOMs intact;  Neck:  Neck supple, trachea midline. No masses, lumps or thyromegaly present.  Cardiovascular: Normal rate and rhythm. S1,S2 noted.  No murmur, rubs or gallops noted. No JVD or BLE edema.  Pulmonary/Chest: Normal effort and positive vesicular breath sounds. No respiratory distress. No wheezes, rales or ronchi noted.  Abdomen: Soft and nontender. Normal bowel sounds.  Musculoskeletal: Strength 5/5 BUE/BLE. No difficulty with gait.  Neurological: Alert and oriented. Cranial nerves II-XII grossly intact. Coordination normal.  Psychiatric: Mood and affect normal. Behavior is normal. Judgment and thought content normal.   BMET    Component Value Date/Time   NA 138 06/03/2023 0948   K 3.9  06/03/2023 0948   CL 104 06/03/2023 0948   CO2 27 06/03/2023 0948   GLUCOSE 89 06/03/2023 0948   BUN 14 06/03/2023 0948   CREATININE 0.86 06/03/2023 0948   CALCIUM 9.2 06/03/2023 0948   GFRNONAA 108 12/04/2020 0757   GFRAA 125 12/04/2020 0757    Lipid Panel     Component Value Date/Time   CHOL 139 06/03/2023 0948   TRIG 120 06/03/2023 0948   HDL 34 (L) 06/03/2023 0948   CHOLHDL 4.1 06/03/2023 0948   VLDL 55.4 (H) 08/28/2020 1507   LDLCALC 83 06/03/2023 0948    CBC    Component Value Date/Time   WBC 3.7 (L) 06/03/2023 0948   RBC 4.36 06/03/2023 0948   HGB 11.4 (L) 06/03/2023 0948   HCT 36.3 06/03/2023 0948   PLT 270 06/03/2023 0948   MCV 83.3  06/03/2023 0948   MCH 26.1 (L) 06/03/2023 0948   MCHC 31.4 (L) 06/03/2023 0948   RDW 16.9 (H) 06/03/2023 0948    Hgb A1C Lab Results  Component Value Date   HGBA1C 5.5 06/03/2023           Assessment & Plan:   Preventative health maintenance:  Encouraged her to get a flu shot in the fall Tetanus UTD Encouraged her to get her COVID booster Pneumovax UTD Prevnar 20 today Pap smear UTD Mammogram ordered-she will call to schedule Colon screening UTD Encouraged her to consume a balanced diet and exercise regimen Advised her to see an eye doctor and dentist annually We will check CBC, c-Met, lipid, A1c and urine microalbumin today  RTC in 6 months, follow-up chronic conditions Angeline Laura, NP

## 2023-12-02 NOTE — Assessment & Plan Note (Signed)
 Encourage diet and exercise for weight loss

## 2023-12-02 NOTE — Patient Instructions (Signed)

## 2023-12-03 ENCOUNTER — Ambulatory Visit: Payer: Self-pay | Admitting: Internal Medicine

## 2023-12-03 DIAGNOSIS — D709 Neutropenia, unspecified: Secondary | ICD-10-CM

## 2023-12-03 DIAGNOSIS — D509 Iron deficiency anemia, unspecified: Secondary | ICD-10-CM

## 2023-12-03 LAB — COMPREHENSIVE METABOLIC PANEL WITH GFR
AG Ratio: 1 (calc) (ref 1.0–2.5)
ALT: 10 U/L (ref 6–29)
AST: 11 U/L (ref 10–35)
Albumin: 3.5 g/dL — ABNORMAL LOW (ref 3.6–5.1)
Alkaline phosphatase (APISO): 62 U/L (ref 31–125)
BUN: 12 mg/dL (ref 7–25)
CO2: 23 mmol/L (ref 20–32)
Calcium: 8.8 mg/dL (ref 8.6–10.2)
Chloride: 104 mmol/L (ref 98–110)
Creat: 0.77 mg/dL (ref 0.50–0.99)
Globulin: 3.4 g/dL (ref 1.9–3.7)
Glucose, Bld: 88 mg/dL (ref 65–99)
Potassium: 4.2 mmol/L (ref 3.5–5.3)
Sodium: 134 mmol/L — ABNORMAL LOW (ref 135–146)
Total Bilirubin: 0.6 mg/dL (ref 0.2–1.2)
Total Protein: 6.9 g/dL (ref 6.1–8.1)
eGFR: 96 mL/min/{1.73_m2} (ref >=60)

## 2023-12-03 LAB — CBC
HCT: 33.1 % — ABNORMAL LOW (ref 35.0–45.0)
Hemoglobin: 10.4 g/dL — ABNORMAL LOW (ref 11.7–15.5)
MCH: 26 pg — ABNORMAL LOW (ref 27.0–33.0)
MCHC: 31.4 g/dL — ABNORMAL LOW (ref 32.0–36.0)
MCV: 82.8 fL (ref 80.0–100.0)
MPV: 10 fL (ref 7.5–12.5)
Platelets: 267 10*3/uL (ref 140–400)
RBC: 4 10*6/uL (ref 3.80–5.10)
RDW: 16.1 % — ABNORMAL HIGH (ref 11.0–15.0)
WBC: 3.7 10*3/uL — ABNORMAL LOW (ref 3.8–10.8)

## 2023-12-03 LAB — HEMOGLOBIN A1C
Hgb A1c MFr Bld: 5.6 % (ref <5.7)
Mean Plasma Glucose: 114 mg/dL
eAG (mmol/L): 6.3 mmol/L

## 2023-12-03 LAB — MICROALBUMIN / CREATININE URINE RATIO
Creatinine, Urine: 148 mg/dL (ref 20–275)
Microalb Creat Ratio: 14 mg/g{creat} (ref <30)
Microalb, Ur: 2.1 mg/dL

## 2023-12-03 LAB — LIPID PANEL
Cholesterol: 106 mg/dL (ref <200)
HDL: 37 mg/dL — ABNORMAL LOW (ref >=50)
LDL Cholesterol (Calc): 55 mg/dL
Non-HDL Cholesterol (Calc): 69 mg/dL (ref <130)
Total CHOL/HDL Ratio: 2.9 (calc) (ref <5.0)
Triglycerides: 62 mg/dL (ref <150)

## 2023-12-06 ENCOUNTER — Other Ambulatory Visit: Payer: Self-pay

## 2023-12-06 MED ORDER — TIRZEPATIDE 10 MG/0.5ML ~~LOC~~ SOAJ
10.0000 mg | SUBCUTANEOUS | 1 refills | Status: DC
  Filled 2023-12-06: qty 2, 28d supply, fill #0
  Filled 2023-12-30: qty 2, 28d supply, fill #1
  Filled 2024-01-31: qty 2, 28d supply, fill #2
  Filled 2024-03-02: qty 2, 28d supply, fill #3
  Filled 2024-04-08: qty 2, 28d supply, fill #4
  Filled 2024-04-30: qty 2, 28d supply, fill #5

## 2023-12-23 ENCOUNTER — Inpatient Hospital Stay: Attending: Internal Medicine | Admitting: Internal Medicine

## 2023-12-23 ENCOUNTER — Encounter: Payer: Self-pay | Admitting: Internal Medicine

## 2023-12-23 ENCOUNTER — Inpatient Hospital Stay

## 2023-12-23 VITALS — BP 104/60 | HR 71 | Temp 97.8°F | Resp 18 | Ht 64.0 in | Wt 229.4 lb

## 2023-12-23 DIAGNOSIS — N920 Excessive and frequent menstruation with regular cycle: Secondary | ICD-10-CM | POA: Diagnosis not present

## 2023-12-23 DIAGNOSIS — Z83719 Family history of colon polyps, unspecified: Secondary | ICD-10-CM

## 2023-12-23 DIAGNOSIS — E669 Obesity, unspecified: Secondary | ICD-10-CM

## 2023-12-23 DIAGNOSIS — Z7984 Long term (current) use of oral hypoglycemic drugs: Secondary | ICD-10-CM | POA: Diagnosis not present

## 2023-12-23 DIAGNOSIS — E611 Iron deficiency: Secondary | ICD-10-CM | POA: Diagnosis not present

## 2023-12-23 DIAGNOSIS — E119 Type 2 diabetes mellitus without complications: Secondary | ICD-10-CM | POA: Diagnosis not present

## 2023-12-23 DIAGNOSIS — D649 Anemia, unspecified: Secondary | ICD-10-CM | POA: Insufficient documentation

## 2023-12-23 DIAGNOSIS — Z803 Family history of malignant neoplasm of breast: Secondary | ICD-10-CM

## 2023-12-23 LAB — COMPREHENSIVE METABOLIC PANEL WITH GFR
ALT: 15 U/L (ref 0–44)
AST: 19 U/L (ref 15–41)
Albumin: 3.6 g/dL (ref 3.5–5.0)
Alkaline Phosphatase: 76 U/L (ref 38–126)
Anion gap: 6 (ref 5–15)
BUN: 14 mg/dL (ref 6–20)
CO2: 23 mmol/L (ref 22–32)
Calcium: 8.7 mg/dL — ABNORMAL LOW (ref 8.9–10.3)
Chloride: 103 mmol/L (ref 98–111)
Creatinine, Ser: 0.88 mg/dL (ref 0.44–1.00)
GFR, Estimated: >60 mL/min (ref >60)
Glucose, Bld: 108 mg/dL — ABNORMAL HIGH (ref 70–99)
Potassium: 3.8 mmol/L (ref 3.5–5.1)
Sodium: 132 mmol/L — ABNORMAL LOW (ref 135–145)
Total Bilirubin: 0.9 mg/dL (ref 0.0–1.2)
Total Protein: 8.1 g/dL (ref 6.5–8.1)

## 2023-12-23 LAB — CBC WITH DIFFERENTIAL/PLATELET
Abs Immature Granulocytes: 0.01 K/uL (ref 0.00–0.07)
Basophils Absolute: 0 K/uL (ref 0.0–0.1)
Basophils Relative: 1 %
Eosinophils Absolute: 0.1 K/uL (ref 0.0–0.5)
Eosinophils Relative: 2 %
HCT: 33.6 % — ABNORMAL LOW (ref 36.0–46.0)
Hemoglobin: 10.5 g/dL — ABNORMAL LOW (ref 12.0–15.0)
Immature Granulocytes: 0 %
Lymphocytes Relative: 41 %
Lymphs Abs: 1.6 K/uL (ref 0.7–4.0)
MCH: 25.2 pg — ABNORMAL LOW (ref 26.0–34.0)
MCHC: 31.3 g/dL (ref 30.0–36.0)
MCV: 80.8 fL (ref 80.0–100.0)
Monocytes Absolute: 0.3 K/uL (ref 0.1–1.0)
Monocytes Relative: 7 %
Neutro Abs: 1.9 K/uL (ref 1.7–7.7)
Neutrophils Relative %: 49 %
Platelets: 280 K/uL (ref 150–400)
RBC: 4.16 MIL/uL (ref 3.87–5.11)
RDW: 16.3 % — ABNORMAL HIGH (ref 11.5–15.5)
WBC: 3.9 K/uL — ABNORMAL LOW (ref 4.0–10.5)
nRBC: 0 % (ref 0.0–0.2)

## 2023-12-23 LAB — VITAMIN B12: Vitamin B-12: 732 pg/mL (ref 180–914)

## 2023-12-23 LAB — IRON AND TIBC
Iron: 45 ug/dL (ref 28–170)
Saturation Ratios: 9 % — ABNORMAL LOW (ref 10.4–31.8)
TIBC: 487 ug/dL — ABNORMAL HIGH (ref 250–450)
UIBC: 442 ug/dL

## 2023-12-23 LAB — LACTATE DEHYDROGENASE: LDH: 118 U/L (ref 98–192)

## 2023-12-23 LAB — FERRITIN: Ferritin: 5 ng/mL — ABNORMAL LOW (ref 11–307)

## 2023-12-23 LAB — FOLATE: Folate: 15 ng/mL (ref >5.9)

## 2023-12-23 NOTE — Progress Notes (Signed)
 [facility] CONSULT NOTE  Patient Care Team: Antonette Angeline ORN, NP as PCP - General (Internal Medicine) Rennie Cindy SAUNDERS, MD as Consulting Physician (Oncology)  CHIEF COMPLAINTS/PURPOSE OF CONSULTATION: ANEMIA   HEMATOLOGY HISTORY  # ANEMIA[Hb; MCV-platelets- WBC; Iron  sat; ferritin;  GFR- CT/US - ;   HISTORY OF PRESENTING ILLNESS:  Kathy [name] 48 y.o.  female pleasant patient is  been referred to us  for further evaluation of anemia.  Patient complains of shortness of breath with exertion.  Also complains of excessive fatigue.  Complains of dizziness especially on standing.  Pica:-to ice.   Blood in stools: none EGD- none/colonoscopy- 2024 Blood in urine:none Difficulty swallowing:none Change of bowel movement/constipation: none-  Prior blood transfusion: none Kidney/Liver disease:none Alcohol: rare Bariatric surgery: none   Vaginal bleeding:  very heavy  Prior evaluation with hematology:none Prior bone marrow biopsy: none Oral iron : not on PO iron  sec to constipation Prior IV iron  infusions: none   Review of Systems  Constitutional:  Negative for chills, diaphoresis, fever, malaise/fatigue and weight loss.  HENT:  Negative for nosebleeds and sore throat.   Eyes:  Negative for double vision.  Respiratory:  Negative for cough, hemoptysis, sputum production, shortness of breath and wheezing.   Cardiovascular:  Negative for chest pain, palpitations, orthopnea and leg swelling.  Gastrointestinal:  Negative for abdominal pain, blood in stool, constipation, diarrhea, heartburn, melena, nausea and vomiting.  Genitourinary:  Negative for dysuria, frequency and urgency.  Musculoskeletal:  Negative for back pain and joint pain.  Skin: Negative.  Negative for itching and rash.  Neurological:  Negative for dizziness, tingling, focal weakness, weakness and headaches.  Endo/Heme/Allergies:  Does not bruise/bleed easily.  Psychiatric/Behavioral:  Negative for  depression. The patient is not nervous/anxious and does not have insomnia.      MEDICAL HISTORY:  Past Medical History:  Diagnosis Date   Diabetes mellitus without complication (HCC)    Hypertension     SURGICAL HISTORY: Past Surgical History:  Procedure Laterality Date   COLONOSCOPY WITH PROPOFOL  N/A 12/01/2022   Procedure: COLONOSCOPY WITH PROPOFOL ;  Surgeon: Jinny Carmine, MD;  Location: [facility] ENDOSCOPY;  Service: Endoscopy;  Laterality: N/A;   WISDOM TOOTH EXTRACTION      SOCIAL HISTORY: Social History   Socioeconomic History   Marital status: Single    Spouse name: Not on file   Number of children: 3   Years of education: Not on file   Highest education level: Not on file  Occupational History   Not on file  Tobacco Use   Smoking status: Never   Smokeless tobacco: Never  Vaping Use   Vaping status: Never Used  Substance and Sexual Activity   Alcohol use: Yes    Comment: rare; social drinker   Drug use: No   Sexual activity: Yes    Birth control/protection: Surgical  Other Topics Concern   Not on file  Social History Narrative   Not on file   Social Drivers of Health   Financial Resource Strain: Not on file  Food Insecurity: No Food Insecurity (12/23/2023)   Hunger Vital Sign    Worried About Running Out of Food in the Last Year: Never true    Ran Out of Food in the Last Year: Never true  Transportation Needs: No Transportation Needs (12/23/2023)   PRAPARE - Administrator, Civil Service (Medical): No    Lack of Transportation (Non-Medical): No  Physical Activity: Not on file  Stress: Not on file  Social Connections: Not on file  Intimate Partner Violence: Not At Risk (12/23/2023)   Humiliation, Afraid, Rape, and Kick questionnaire    Fear of Current or Ex-Partner: No    Emotionally Abused: No    Physically Abused: No    Sexually Abused: No    FAMILY HISTORY: Family History  Problem Relation Age of Onset   Hypertension Mother    Colon  polyps Mother    Diabetes Maternal Aunt    Breast cancer Paternal Grandmother     ALLERGIES:  has no known allergies.  MEDICATIONS:  Current Outpatient Medications  Medication Sig Dispense Refill   amLODipine  (NORVASC ) 10 MG tablet Take 1 tablet (10 mg total) by mouth daily. 90 tablet 1   glipiZIDE  (GLUCOTROL  XL) 5 MG 24 hr tablet Take 1 tablet (5 mg total) by mouth daily with breakfast. 90 tablet 0   Iron , Ferrous Sulfate , 325 (65 Fe) MG TABS Take 325 mg by mouth daily. (Patient not taking: Reported on 12/02/2023) 90 tablet 1   losartan  (COZAAR ) 100 MG tablet Take 1 tablet (100 mg total) by mouth daily. 90 tablet 1   simvastatin  (ZOCOR ) 20 MG tablet Take 1 tablet (20 mg total) by mouth at bedtime. 90 tablet 3   spironolactone  (ALDACTONE ) 25 MG tablet Take 1 tablet (25 mg total) by mouth daily. 90 tablet 0   tirzepatide  (MOUNJARO ) 10 MG/0.5ML Pen Inject 10 mg into the skin once a week. 6 mL 1   No current facility-administered medications for this visit.     PHYSICAL EXAMINATION:   Vitals:   12/23/23 1100  BP: 104/60  Pulse: 71  Resp: 18  Temp: 97.8 F (36.6 C)  SpO2: 100%   Filed Weights   12/23/23 1100  Weight: 229 lb 6.4 oz (104.1 kg)    Physical Exam Vitals and nursing note reviewed.  HENT:     Head: Normocephalic and atraumatic.     Mouth/Throat:     Pharynx: Oropharynx is clear.  Eyes:     Extraocular Movements: Extraocular movements intact.     Pupils: Pupils are equal, round, and reactive to light.  Cardiovascular:     Rate and Rhythm: Normal rate and regular rhythm.  Pulmonary:     Comments: Decreased breath sounds bilaterally.  Abdominal:     Palpations: Abdomen is soft.  Musculoskeletal:        General: Normal range of motion.     Cervical back: Normal range of motion.  Skin:    General: Skin is warm.  Neurological:     General: No focal deficit present.     Mental Status: She is alert and oriented to person, place, and time.  Psychiatric:         Behavior: Behavior normal.        Judgment: Judgment normal.      LABORATORY DATA:  I have reviewed the data as listed Lab Results  Component Value Date   WBC 3.9 (L) 12/23/2023   HGB 10.5 (L) 12/23/2023   HCT 33.6 (L) 12/23/2023   MCV 80.8 12/23/2023   PLT 280 12/23/2023   Recent Labs    06/03/23 0948 12/02/23 1025 12/23/23 1130  NA 138 134* 132*  K 3.9 4.2 3.8  CL 104 104 103  CO2 27 23 23   GLUCOSE 89 88 108*  BUN 14 12 14   CREATININE 0.86 0.77 0.88  CALCIUM 9.2 8.8 8.7*  GFRNONAA  --   --  >60  PROT 7.7 6.9 8.1  ALBUMIN  --   --  3.6  AST 14 11 19   ALT 14 10 15   ALKPHOS  --   --  76  BILITOT 0.5 0.6 0.9     No results found.  ASSESSMENT & PLAN:   Symptomatic anemia # Anemia- Hb-symptomatic.  Likely due to iron  deficiency - from etiology menorrhagia.  Poor tolerance/lack of improvement on oral iron .  Discussed regarding IV iron  infusion/Venofer . Discussed the potential acute infusion reactions with IV iron ; which are quite rare.  Patient understands the risk; will proceed with infusions.   # Recommend CBC CMP LDH peripheral smear; haptoglobin; iron  studies ferritin B12 folic acid; Urine analysis.  Urine pregnancy test.   #Etiology of iron  deficiency: likely from menorrhagia.  EGD- none/colonoscopy- 2024   # obesity/DM- on mujauro-   Thank you Ms. Baity NP for allowing me to participate in the care of your pleasant patient. Please do not hesitate to contact me with questions or concerns in the interim.  Tubal ligation  # DISPOSITION: # labs today-  # weekly venofer  x3- start asap  # follow up 2 MD; labs- cbc/bmp; possible venofer - Dr.B    All questions were answered. The patient knows to call the clinic with any problems, questions or concerns.    Cindy JONELLE Joe, MD 01/20/2024 6:42 PM

## 2023-12-23 NOTE — Progress Notes (Signed)
 Patient is a new patient and wants to know about the blood work she is suppose to be getting.

## 2023-12-23 NOTE — Assessment & Plan Note (Addendum)
#   Anemia- Hb-symptomatic.  Likely due to iron  deficiency - from etiology menorrhagia.  Poor tolerance/lack of improvement on oral iron .  Discussed regarding IV iron  infusion/Venofer . Discussed the potential acute infusion reactions with IV iron ; which are quite rare.  Patient understands the risk; will proceed with infusions.   # Recommend CBC CMP LDH peripheral smear; haptoglobin; iron  studies ferritin B12 folic acid; Urine analysis.  Urine pregnancy test.   #Etiology of iron  deficiency: likely from menorrhagia.  EGD- none/colonoscopy- 2024   # obesity/DM- on mujauro-   Thank you Ms. Baity NP for allowing me to participate in the care of your pleasant patient. Please do not hesitate to contact me with questions or concerns in the interim.  Tubal ligation  # DISPOSITION: # labs today-  # weekly venofer  x3- start asap  # follow up 2 MD; labs- cbc/bmp; possible venofer - Dr.B

## 2023-12-24 ENCOUNTER — Other Ambulatory Visit: Payer: Self-pay | Admitting: Internal Medicine

## 2023-12-24 ENCOUNTER — Other Ambulatory Visit: Payer: Self-pay

## 2023-12-27 ENCOUNTER — Other Ambulatory Visit: Payer: Self-pay

## 2023-12-27 MED FILL — Spironolactone Tab 25 MG: ORAL | 90 days supply | Qty: 90 | Fill #0 | Status: AC

## 2023-12-27 MED FILL — Simvastatin Tab 20 MG: ORAL | 90 days supply | Qty: 90 | Fill #0 | Status: AC

## 2023-12-27 MED FILL — Amlodipine Besylate Tab 10 MG (Base Equivalent): ORAL | 90 days supply | Qty: 90 | Fill #0 | Status: AC

## 2023-12-27 NOTE — Telephone Encounter (Signed)
 OV 12/02/23 Requested Prescriptions  Pending Prescriptions Disp Refills   amLODipine  (NORVASC ) 10 MG tablet 90 tablet 1    Sig: Take 1 tablet (10 mg total) by mouth daily.     Cardiovascular: Calcium Channel Blockers 2 Passed - 12/27/2023  3:49 PM      Passed - Last BP in normal range    BP Readings from Last 1 Encounters:  12/23/23 104/60         Passed - Last Heart Rate in normal range    Pulse Readings from Last 1 Encounters:  12/23/23 71         Passed - Valid encounter within last 6 months    Recent Outpatient Visits           3 weeks ago Encounter for general adult medical examination with abnormal findings   [place] [facility] [place], [place], NP               simvastatin  (ZOCOR ) 20 MG tablet 90 tablet 3    Sig: Take 1 tablet (20 mg total) by mouth at bedtime.     Cardiovascular:  Antilipid - Statins Failed - 12/27/2023  3:49 PM      Failed - Lipid Panel in normal range within the last 12 months    Cholesterol  Date Value Ref Range Status  12/02/2023 106 <200 mg/dL Final   LDL Cholesterol (Calc)  Date Value Ref Range Status  12/02/2023 55 mg/dL (calc) Final    Comment:    Reference range: <100 . Desirable range <100 mg/dL for primary prevention;   <70 mg/dL for patients with CHD or diabetic patients  with > or = 2 CHD risk factors. SABRA LDL-C is now calculated using the Martin-Hopkins  calculation, which is a validated novel method providing  better accuracy than the Friedewald equation in the  estimation of LDL-C.  Gladis APPLETHWAITE et al. SANDREA. 7986;689(80): 2061-2068  (http://education.[facility].com/faq/FAQ164)    Direct LDL  Date Value Ref Range Status  08/28/2020 62.0 mg/dL Final    Comment:    Optimal:  <100 mg/dLNear or Above Optimal:  100-129 mg/dLBorderline High:  130-159 mg/dLHigh:  160-189 mg/dLVery High:  >190 mg/dL   HDL  Date Value Ref Range Status  12/02/2023 37 (L) > OR = 50 mg/dL Final   Triglycerides  Date  Value Ref Range Status  12/02/2023 62 <150 mg/dL Final         Passed - Patient is not pregnant      Passed - Valid encounter within last 12 months    Recent Outpatient Visits           3 weeks ago Encounter for general adult medical examination with abnormal findings   [place] [facility] [place], [place], NP               spironolactone  (ALDACTONE ) 25 MG tablet 90 tablet 0    Sig: TAKE 1 TABLET (25 MG TOTAL) BY MOUTH DAILY.     Cardiovascular: Diuretics - Aldosterone Antagonist Failed - 12/27/2023  3:49 PM      Failed - Na in normal range and within 180 days    Sodium  Date Value Ref Range Status  12/23/2023 132 (L) 135 - 145 mmol/L Final         Passed - Cr in normal range and within 180 days    Creat  Date Value Ref Range Status  12/02/2023 0.77 0.50 - 0.99 mg/dL Final  Creatinine, Ser  Date Value Ref Range Status  12/23/2023 0.88 0.44 - 1.00 mg/dL Final   Creatinine, Urine  Date Value Ref Range Status  12/02/2023 148 20 - 275 mg/dL Final         Passed - K in normal range and within 180 days    Potassium  Date Value Ref Range Status  12/23/2023 3.8 3.5 - 5.1 mmol/L Final         Passed - eGFR is 30 or above and within 180 days    GFR, Est African American  Date Value Ref Range Status  12/04/2020 125 > OR = 60 mL/min/1.70m2 Final   GFR, Est Non African American  Date Value Ref Range Status  12/04/2020 108 > OR = 60 mL/min/1.69m2 Final   GFR, Estimated  Date Value Ref Range Status  12/23/2023 >60 >60 mL/min Final    Comment:    (NOTE) Calculated using the CKD-EPI Creatinine Equation (2021)    GFR  Date Value Ref Range Status  09/17/2020 106.57 >60.00 mL/min Final    Comment:    Calculated using the CKD-EPI Creatinine Equation (2021)   eGFR  Date Value Ref Range Status  12/02/2023 96 > OR = 60 mL/min/1.27m2 Final         Passed - Last BP in normal range    BP Readings from Last 1 Encounters:  12/23/23 104/60          Passed - Valid encounter within last 6 months    Recent Outpatient Visits           3 weeks ago Encounter for general adult medical examination with abnormal findings   [place] [facility] [place], Angeline ORN, NP

## 2023-12-29 ENCOUNTER — Other Ambulatory Visit: Payer: Self-pay

## 2023-12-30 ENCOUNTER — Other Ambulatory Visit: Payer: Self-pay

## 2023-12-30 ENCOUNTER — Inpatient Hospital Stay

## 2023-12-30 VITALS — BP 126/67 | HR 59 | Temp 97.0°F | Resp 18

## 2023-12-30 DIAGNOSIS — D649 Anemia, unspecified: Secondary | ICD-10-CM

## 2023-12-30 MED ORDER — IRON SUCROSE 20 MG/ML IV SOLN
200.0000 mg | Freq: Once | INTRAVENOUS | Status: AC
  Administered 2023-12-30: 200 mg via INTRAVENOUS
  Filled 2023-12-30: qty 10

## 2023-12-30 NOTE — Patient Instructions (Signed)

## 2024-01-06 ENCOUNTER — Other Ambulatory Visit: Payer: Self-pay | Admitting: Internal Medicine

## 2024-01-06 ENCOUNTER — Inpatient Hospital Stay

## 2024-01-06 ENCOUNTER — Other Ambulatory Visit: Payer: Self-pay

## 2024-01-06 VITALS — BP 134/78 | HR 61 | Temp 97.1°F | Resp 18

## 2024-01-06 DIAGNOSIS — D649 Anemia, unspecified: Secondary | ICD-10-CM

## 2024-01-06 MED ORDER — IRON SUCROSE 20 MG/ML IV SOLN
200.0000 mg | Freq: Once | INTRAVENOUS | Status: AC
  Administered 2024-01-06: 200 mg via INTRAVENOUS
  Filled 2024-01-06: qty 10

## 2024-01-07 ENCOUNTER — Other Ambulatory Visit: Payer: Self-pay

## 2024-01-07 MED FILL — Losartan Potassium Tab 100 MG: ORAL | 90 days supply | Qty: 90 | Fill #0 | Status: AC

## 2024-01-07 NOTE — Telephone Encounter (Signed)
 Requested Prescriptions  Pending Prescriptions Disp Refills   losartan  (COZAAR ) 100 MG tablet 90 tablet 1    Sig: Take 1 tablet (100 mg total) by mouth daily.     Cardiovascular:  Angiotensin Receptor Blockers Passed - 01/07/2024 12:13 PM      Passed - Cr in normal range and within 180 days    Creat  Date Value Ref Range Status  12/02/2023 0.77 0.50 - 0.99 mg/dL Final   Creatinine, Ser  Date Value Ref Range Status  12/23/2023 0.88 0.44 - 1.00 mg/dL Final   Creatinine, Urine  Date Value Ref Range Status  12/02/2023 148 20 - 275 mg/dL Final         Passed - K in normal range and within 180 days    Potassium  Date Value Ref Range Status  12/23/2023 3.8 3.5 - 5.1 mmol/L Final         Passed - Patient is not pregnant      Passed - Last BP in normal range    BP Readings from Last 1 Encounters:  01/06/24 134/78         Passed - Valid encounter within last 6 months    Recent Outpatient Visits           1 month ago Encounter for general adult medical examination with abnormal findings    [facility] [place], Angeline ORN, NP

## 2024-01-13 ENCOUNTER — Inpatient Hospital Stay: Attending: Internal Medicine

## 2024-01-13 VITALS — BP 126/66 | HR 53 | Temp 97.6°F

## 2024-01-13 DIAGNOSIS — E611 Iron deficiency: Secondary | ICD-10-CM | POA: Diagnosis not present

## 2024-01-13 DIAGNOSIS — D649 Anemia, unspecified: Secondary | ICD-10-CM

## 2024-01-13 MED ORDER — IRON SUCROSE 20 MG/ML IV SOLN
200.0000 mg | Freq: Once | INTRAVENOUS | Status: AC
  Administered 2024-01-13: 200 mg via INTRAVENOUS
  Filled 2024-01-13: qty 10

## 2024-01-13 NOTE — Patient Instructions (Signed)

## 2024-01-20 ENCOUNTER — Encounter: Payer: Self-pay | Admitting: Internal Medicine

## 2024-01-31 ENCOUNTER — Other Ambulatory Visit: Payer: Self-pay

## 2024-02-03 ENCOUNTER — Ambulatory Visit
Admission: RE | Admit: 2024-02-03 | Discharge: 2024-02-03 | Disposition: A | Discharge status: Home / Self Care | Source: Ambulatory Visit | Attending: Internal Medicine | Admitting: Internal Medicine

## 2024-02-03 DIAGNOSIS — Z1231 Encounter for screening mammogram for malignant neoplasm of breast: Secondary | ICD-10-CM | POA: Insufficient documentation

## 2024-02-03 DIAGNOSIS — Z0001 Encounter for general adult medical examination with abnormal findings: Secondary | ICD-10-CM

## 2024-03-02 ENCOUNTER — Encounter: Payer: Self-pay | Admitting: Internal Medicine

## 2024-03-02 ENCOUNTER — Inpatient Hospital Stay (HOSPITAL_BASED_OUTPATIENT_CLINIC_OR_DEPARTMENT_OTHER): Admitting: Internal Medicine

## 2024-03-02 ENCOUNTER — Inpatient Hospital Stay: Attending: Internal Medicine

## 2024-03-02 ENCOUNTER — Other Ambulatory Visit: Payer: Self-pay

## 2024-03-02 ENCOUNTER — Inpatient Hospital Stay

## 2024-03-02 VITALS — BP 135/81 | HR 59 | Temp 97.2°F | Resp 18 | Ht 64.0 in | Wt 229.8 lb

## 2024-03-02 VITALS — BP 129/84 | HR 52 | Resp 18

## 2024-03-02 DIAGNOSIS — D649 Anemia, unspecified: Secondary | ICD-10-CM

## 2024-03-02 DIAGNOSIS — N926 Irregular menstruation, unspecified: Secondary | ICD-10-CM | POA: Diagnosis not present

## 2024-03-02 DIAGNOSIS — D509 Iron deficiency anemia, unspecified: Secondary | ICD-10-CM | POA: Diagnosis not present

## 2024-03-02 LAB — CBC WITH DIFFERENTIAL (CANCER CENTER ONLY)
Abs Immature Granulocytes: 0.02 K/uL (ref 0.00–0.07)
Basophils Absolute: 0 K/uL (ref 0.0–0.1)
Basophils Relative: 1 %
Eosinophils Absolute: 0.1 K/uL (ref 0.0–0.5)
Eosinophils Relative: 3 %
HCT: 36.2 % (ref 36.0–46.0)
Hemoglobin: 11.9 g/dL — ABNORMAL LOW (ref 12.0–15.0)
Immature Granulocytes: 1 %
Lymphocytes Relative: 47 %
Lymphs Abs: 1.7 K/uL (ref 0.7–4.0)
MCH: 27.7 pg (ref 26.0–34.0)
MCHC: 32.9 g/dL (ref 30.0–36.0)
MCV: 84.4 fL (ref 80.0–100.0)
Monocytes Absolute: 0.3 K/uL (ref 0.1–1.0)
Monocytes Relative: 7 %
Neutro Abs: 1.4 K/uL — ABNORMAL LOW (ref 1.7–7.7)
Neutrophils Relative %: 41 %
Platelet Count: 222 K/uL (ref 150–400)
RBC: 4.29 MIL/uL (ref 3.87–5.11)
RDW: 18.9 % — ABNORMAL HIGH (ref 11.5–15.5)
WBC Count: 3.5 K/uL — ABNORMAL LOW (ref 4.0–10.5)
nRBC: 0 % (ref 0.0–0.2)

## 2024-03-02 LAB — BASIC METABOLIC PANEL WITH GFR
Anion gap: 5 (ref 5–15)
BUN: 18 mg/dL (ref 6–20)
CO2: 23 mmol/L (ref 22–32)
Calcium: 8.8 mg/dL — ABNORMAL LOW (ref 8.9–10.3)
Chloride: 105 mmol/L (ref 98–111)
Creatinine, Ser: 0.91 mg/dL (ref 0.44–1.00)
GFR, Estimated: >60 mL/min (ref >60)
Glucose, Bld: 96 mg/dL (ref 70–99)
Potassium: 4.2 mmol/L (ref 3.5–5.1)
Sodium: 133 mmol/L — ABNORMAL LOW (ref 135–145)

## 2024-03-02 MED ORDER — IRON SUCROSE 20 MG/ML IV SOLN
200.0000 mg | Freq: Once | INTRAVENOUS | Status: AC
  Administered 2024-03-02: 200 mg via INTRAVENOUS
  Filled 2024-03-02: qty 10

## 2024-03-02 NOTE — Progress Notes (Signed)
 [facility] CONSULT NOTE  Patient Care Team: Kathy [name] ORN, NP as PCP - General (Internal Medicine) Kathy Cindy SAUNDERS, MD as Consulting Physician (Oncology)  CHIEF COMPLAINTS/PURPOSE OF CONSULTATION: ANEMIA   HEMATOLOGY HISTORY  # ANEMIA[Hb; MCV-platelets- WBC; Iron  sat; ferritin;  GFR- CT/US - ;   HISTORY OF PRESENTING ILLNESS:  Kathy [name] 48 y.o.  female pleasant patient with iron  deficiency anemia secondary to menstrual cycles is here for follow-up.  Patient's status post Venofer -improved energy levels.  Not on oral iron  because of history of constipation. More recently menstrual cycles improved.    Review of Systems  Constitutional:  Negative for chills, diaphoresis, fever, malaise/fatigue and weight loss.  HENT:  Negative for nosebleeds and sore throat.   Eyes:  Negative for double vision.  Respiratory:  Negative for cough, hemoptysis, sputum production, shortness of breath and wheezing.   Cardiovascular:  Negative for chest pain, palpitations, orthopnea and leg swelling.  Gastrointestinal:  Negative for abdominal pain, blood in stool, constipation, diarrhea, heartburn, melena, nausea and vomiting.  Genitourinary:  Negative for dysuria, frequency and urgency.  Musculoskeletal:  Negative for back pain and joint pain.  Skin: Negative.  Negative for itching and rash.  Neurological:  Negative for dizziness, tingling, focal weakness, weakness and headaches.  Endo/Heme/Allergies:  Does not bruise/bleed easily.  Psychiatric/Behavioral:  Negative for depression. The patient is not nervous/anxious and does not have insomnia.      MEDICAL HISTORY:  Past Medical History:  Diagnosis Date   Diabetes mellitus without complication (HCC)    Hypertension     SURGICAL HISTORY: Past Surgical History:  Procedure Laterality Date   COLONOSCOPY WITH PROPOFOL  N/A 12/01/2022   Procedure: COLONOSCOPY WITH PROPOFOL ;  Surgeon: Jinny Carmine, MD;  Location: [facility]  ENDOSCOPY;  Service: Endoscopy;  Laterality: N/A;   WISDOM TOOTH EXTRACTION      SOCIAL HISTORY: Social History   Socioeconomic History   Marital status: Single    Spouse name: Not on file   Number of children: 3   Years of education: Not on file   Highest education level: Not on file  Occupational History   Not on file  Tobacco Use   Smoking status: Never   Smokeless tobacco: Never  Vaping Use   Vaping status: Never Used  Substance and Sexual Activity   Alcohol use: Yes    Comment: rare; social drinker   Drug use: No   Sexual activity: Yes    Birth control/protection: Surgical  Other Topics Concern   Not on file  Social History Narrative   Not on file   Social Drivers of Health   Financial Resource Strain: Not on file  Food Insecurity: No Food Insecurity (12/23/2023)   Hunger Vital Sign    Worried About Running Out of Food in the Last Year: Never true    Ran Out of Food in the Last Year: Never true  Transportation Needs: No Transportation Needs (12/23/2023)   PRAPARE - Administrator, Civil Service (Medical): No    Lack of Transportation (Non-Medical): No  Physical Activity: Not on file  Stress: Not on file  Social Connections: Not on file  Intimate Partner Violence: Not At Risk (12/23/2023)   Humiliation, Afraid, Rape, and Kick questionnaire    Fear of Current or Ex-Partner: No    Emotionally Abused: No    Physically Abused: No    Sexually Abused: No    FAMILY HISTORY: Family History  Problem Relation Age of Onset  Hypertension Mother    Colon polyps Mother    Diabetes Maternal Aunt    Breast cancer Paternal Grandmother     ALLERGIES:  has no known allergies.  MEDICATIONS:  Current Outpatient Medications  Medication Sig Dispense Refill   amLODipine  (NORVASC ) 10 MG tablet Take 1 tablet (10 mg total) by mouth daily. 90 tablet 1   glipiZIDE  (GLUCOTROL  XL) 5 MG 24 hr tablet Take 1 tablet (5 mg total) by mouth daily with breakfast. 90 tablet 0    losartan  (COZAAR ) 100 MG tablet Take 1 tablet (100 mg total) by mouth daily. 90 tablet 1   simvastatin  (ZOCOR ) 20 MG tablet Take 1 tablet (20 mg total) by mouth at bedtime. 90 tablet 3   spironolactone  (ALDACTONE ) 25 MG tablet Take 1 tablet (25 mg total) by mouth daily. 90 tablet 0   tirzepatide  (MOUNJARO ) 10 MG/0.5ML Pen Inject 10 mg into the skin once a week. 6 mL 1   No current facility-administered medications for this visit.     PHYSICAL EXAMINATION:   Vitals:   03/02/24 0949  BP: 135/81  Pulse: (!) 59  Resp: 18  Temp: (!) 97.2 F (36.2 C)  SpO2: 100%   Filed Weights   03/02/24 0949  Weight: 229 lb 12.8 oz (104.2 kg)    Physical Exam Vitals and nursing note reviewed.  HENT:     Head: Normocephalic and atraumatic.     Mouth/Throat:     Pharynx: Oropharynx is clear.  Eyes:     Extraocular Movements: Extraocular movements intact.     Pupils: Pupils are equal, round, and reactive to light.  Cardiovascular:     Rate and Rhythm: Normal rate and regular rhythm.  Pulmonary:     Comments: Decreased breath sounds bilaterally.  Abdominal:     Palpations: Abdomen is soft.  Musculoskeletal:        General: Normal range of motion.     Cervical back: Normal range of motion.  Skin:    General: Skin is warm.  Neurological:     General: No focal deficit present.     Mental Status: She is alert and oriented to person, place, and time.  Psychiatric:        Behavior: Behavior normal.        Judgment: Judgment normal.      LABORATORY DATA:  I have reviewed the data as listed Lab Results  Component Value Date   WBC 3.5 (L) 03/02/2024   HGB 11.9 (L) 03/02/2024   HCT 36.2 03/02/2024   MCV 84.4 03/02/2024   PLT 222 03/02/2024   Recent Labs    06/03/23 0948 12/02/23 1025 12/23/23 1130 03/02/24 0953  NA 138 134* 132* 133*  K 3.9 4.2 3.8 4.2  CL 104 104 103 105  CO2 27 23 23 23   GLUCOSE 89 88 108* 96  BUN 14 12 14 18   CREATININE 0.86 0.77 0.88 0.91  CALCIUM  9.2 8.8 8.7* 8.8*  GFRNONAA  --   --  >60 >60  PROT 7.7 6.9 8.1  --   ALBUMIN  --   --  3.6  --   AST 14 11 19   --   ALT 14 10 15   --   ALKPHOS  --   --  76  --   BILITOT 0.5 0.6 0.9  --      MM 3D SCREENING MAMMOGRAM BILATERAL BREAST Result Date: 02/08/2024 CLINICAL DATA:  Screening. EXAM: DIGITAL SCREENING BILATERAL MAMMOGRAM WITH TOMOSYNTHESIS AND CAD TECHNIQUE:  Bilateral screening digital craniocaudal and mediolateral oblique mammograms were obtained. Bilateral screening digital breast tomosynthesis was performed. The images were evaluated with computer-aided detection. COMPARISON:  Previous exam(s). ACR Breast Density Category b: There are scattered areas of fibroglandular density. FINDINGS: There are no findings suspicious for malignancy. IMPRESSION: No mammographic evidence of malignancy. A result letter of this screening mammogram will be mailed directly to the patient. RECOMMENDATION: Screening mammogram in one year. (Code:SM-B-01Y) BI-RADS CATEGORY  1: Negative. Electronically Signed   By: Dina  Arceo M.D.   On: 02/08/2024 15:48    ASSESSMENT & PLAN:   Symptomatic anemia # Anemia- Hb-symptomatic.  Likely due to iron  deficiency - from etiology menorrhagia.  Poor tolerance/lack of improvement on oral iron . #Recommend gentle iron  [iron  biglycinate; 28 mg ] 1 pill a day.  This pill is unlikely to cause stomach upset or cause constipation.    # s/p venofer -hemoglobin 11.9.  Proceed with iron  infusion today.  #Etiology of iron  deficiency: likely from menorrhagia.  EGD- none/colonoscopy- 2024   # obesity/DM- on mujauro-   Tubal ligation  # DISPOSITION: #  venofer   # follow up 4 months MD; labs- cbc/bmp;iron  studies;ferritin- possible venofer - Dr.B    All questions were answered. The patient knows to call the clinic with any problems, questions or concerns.    Cindy JONELLE Joe, MD 03/02/2024 11:06 AM

## 2024-03-02 NOTE — Patient Instructions (Signed)

## 2024-03-02 NOTE — Patient Instructions (Signed)
#  Recommend gentle iron  [iron  biglycinate; 28 mg ] 1 pill a day.  This pill is unlikely to cause stomach upset or cause constipation.  Available Over the counter or talk to pharmacist.

## 2024-03-02 NOTE — Progress Notes (Signed)
 Fatigue/weakness: NO Dyspena: NO  Light headedness: NO  Blood in stool: NO

## 2024-03-02 NOTE — Assessment & Plan Note (Addendum)
#   Anemia- Hb-symptomatic.  Likely due to iron  deficiency - from etiology menorrhagia.  Poor tolerance/lack of improvement on oral iron . #Recommend gentle iron  [iron  biglycinate; 28 mg ] 1 pill a day.  This pill is unlikely to cause stomach upset or cause constipation.    # s/p venofer -hemoglobin 11.9.  Proceed with iron  infusion today.  #Etiology of iron  deficiency: likely from menorrhagia.  EGD- none/colonoscopy- 2024   # obesity/DM- on mujauro-   Tubal ligation  # DISPOSITION: #  venofer   # follow up 4 months MD; labs- cbc/bmp;iron  studies;ferritin- possible venofer - Dr.B

## 2024-03-11 ENCOUNTER — Other Ambulatory Visit: Payer: Self-pay

## 2024-03-11 ENCOUNTER — Other Ambulatory Visit: Payer: Self-pay | Admitting: Internal Medicine

## 2024-03-13 ENCOUNTER — Other Ambulatory Visit: Payer: Self-pay

## 2024-03-13 MED ORDER — GLIPIZIDE ER 5 MG PO TB24
5.0000 mg | ORAL_TABLET | Freq: Every day | ORAL | 0 refills | Status: DC
  Filled 2024-03-13: qty 90, 90d supply, fill #0

## 2024-03-13 NOTE — Telephone Encounter (Signed)
 Requested Prescriptions  Pending Prescriptions Disp Refills   glipiZIDE  (GLUCOTROL  XL) 5 MG 24 hr tablet 90 tablet 0    Sig: Take 1 tablet (5 mg total) by mouth daily with breakfast.     Endocrinology:  Diabetes - Sulfonylureas Passed - 03/13/2024  4:31 PM      Passed - HBA1C is between 0 and 7.9 and within 180 days    HbA1c, POC (controlled diabetic range)  Date Value Ref Range Status  01/29/2022 6.8 0.0 - 7.0 % Final   Hgb A1c MFr Bld  Date Value Ref Range Status  12/02/2023 5.6 <5.7 % Final    Comment:    For the purpose of screening for the presence of diabetes: . <5.7%       Consistent with the absence of diabetes 5.7-6.4%    Consistent with increased risk for diabetes             (prediabetes) > or =6.5%  Consistent with diabetes . This assay result is consistent with a decreased risk of diabetes. . Currently, no consensus exists regarding use of hemoglobin A1c for diagnosis of diabetes in children. . According to American Diabetes Association (ADA) guidelines, hemoglobin A1c <7.0% represents optimal control in non-pregnant diabetic patients. Different metrics may apply to specific patient populations.  Standards of Medical Care in Diabetes(ADA). .          Passed - Cr in normal range and within 360 days    Creat  Date Value Ref Range Status  12/02/2023 0.77 0.50 - 0.99 mg/dL Final   Creatinine, Ser  Date Value Ref Range Status  03/02/2024 0.91 0.44 - 1.00 mg/dL Final   Creatinine, Urine  Date Value Ref Range Status  12/02/2023 148 20 - 275 mg/dL Final         Passed - Valid encounter within last 6 months    Recent Outpatient Visits           3 months ago Encounter for general adult medical examination with abnormal findings   [place] [facility] [place], Angeline ORN, NP

## 2024-03-16 ENCOUNTER — Other Ambulatory Visit: Payer: Self-pay

## 2024-04-08 ENCOUNTER — Other Ambulatory Visit: Payer: Self-pay | Admitting: Internal Medicine

## 2024-04-08 ENCOUNTER — Other Ambulatory Visit: Payer: Self-pay

## 2024-04-08 MED FILL — Losartan Potassium Tab 100 MG: ORAL | 90 days supply | Qty: 90 | Fill #1 | Status: AC

## 2024-04-08 MED FILL — Simvastatin Tab 20 MG: ORAL | 90 days supply | Qty: 90 | Fill #1 | Status: AC

## 2024-04-08 MED FILL — Amlodipine Besylate Tab 10 MG (Base Equivalent): ORAL | 90 days supply | Qty: 90 | Fill #1 | Status: AC

## 2024-04-08 MED FILL — Losartan Potassium Tab 100 MG: ORAL | 90 days supply | Qty: 90 | Fill #1 | Status: CN

## 2024-04-10 ENCOUNTER — Other Ambulatory Visit: Payer: Self-pay | Admitting: Internal Medicine

## 2024-04-10 ENCOUNTER — Other Ambulatory Visit: Payer: Self-pay

## 2024-04-12 ENCOUNTER — Other Ambulatory Visit: Payer: Self-pay

## 2024-04-12 MED FILL — Spironolactone Tab 25 MG: ORAL | 90 days supply | Qty: 90 | Fill #0 | Status: AC

## 2024-04-12 NOTE — Telephone Encounter (Signed)
 Requested Prescriptions  Pending Prescriptions Disp Refills   spironolactone  (ALDACTONE ) 25 MG tablet 90 tablet 0    Sig: Take 1 tablet (25 mg total) by mouth daily.     Cardiovascular: Diuretics - Aldosterone Antagonist Failed - 04/12/2024  9:07 AM      Failed - Na in normal range and within 180 days    Sodium  Date Value Ref Range Status  03/02/2024 133 (L) 135 - 145 mmol/L Final         Passed - Cr in normal range and within 180 days    Creat  Date Value Ref Range Status  12/02/2023 0.77 0.50 - 0.99 mg/dL Final   Creatinine, Ser  Date Value Ref Range Status  03/02/2024 0.91 0.44 - 1.00 mg/dL Final   Creatinine, Urine  Date Value Ref Range Status  12/02/2023 148 20 - 275 mg/dL Final         Passed - K in normal range and within 180 days    Potassium  Date Value Ref Range Status  03/02/2024 4.2 3.5 - 5.1 mmol/L Final         Passed - eGFR is 30 or above and within 180 days    GFR, Est African American  Date Value Ref Range Status  12/04/2020 125 > OR = 60 mL/min/1.2m2 Final   GFR, Est Non African American  Date Value Ref Range Status  12/04/2020 108 > OR = 60 mL/min/1.35m2 Final   GFR, Estimated  Date Value Ref Range Status  03/02/2024 >60 >60 mL/min Final    Comment:    (NOTE) Calculated using the CKD-EPI Creatinine Equation (2021)    GFR  Date Value Ref Range Status  09/17/2020 106.57 >60.00 mL/min Final    Comment:    Calculated using the CKD-EPI Creatinine Equation (2021)   eGFR  Date Value Ref Range Status  12/02/2023 96 > OR = 60 mL/min/1.61m2 Final         Passed - Last BP in normal range    BP Readings from Last 1 Encounters:  03/02/24 129/84         Passed - Valid encounter within last 6 months    Recent Outpatient Visits           4 months ago Encounter for general adult medical examination with abnormal findings   [facility] [place], Angeline ORN, NP

## 2024-05-01 ENCOUNTER — Other Ambulatory Visit: Payer: Self-pay

## 2024-05-26 ENCOUNTER — Other Ambulatory Visit: Payer: Self-pay

## 2024-05-26 ENCOUNTER — Other Ambulatory Visit: Payer: Self-pay | Admitting: Internal Medicine

## 2024-05-26 DIAGNOSIS — E119 Type 2 diabetes mellitus without complications: Secondary | ICD-10-CM

## 2024-05-30 NOTE — Telephone Encounter (Signed)
 Requested medication (s) are due for refill today: yes  Requested medication (s) are on the active medication list: yes  Last refill:  12/06/23  Future visit scheduled: {Yes  Notes to clinic:   Medication not assigned to a protocol, review manually.      Requested Prescriptions  Pending Prescriptions Disp Refills   MOUNJARO  10 MG/0.5ML Pen [Pharmacy Med Name: tirzepatide  (MOUNJARO ) 10 MG/0.5ML Pen] 6 mL 1    Sig: Inject 10 mg into the skin once a week.     Off-Protocol Failed - 05/30/2024  3:00 PM      Failed - Medication not assigned to a protocol, review manually.      Passed - Valid encounter within last 12 months    Recent Outpatient Visits           6 months ago Encounter for general adult medical examination with abnormal findings   [place] [facility] [place], Angeline ORN, [place]

## 2024-06-02 ENCOUNTER — Other Ambulatory Visit: Payer: Self-pay

## 2024-06-07 ENCOUNTER — Other Ambulatory Visit: Payer: Self-pay | Admitting: Internal Medicine

## 2024-06-07 ENCOUNTER — Other Ambulatory Visit: Payer: Self-pay

## 2024-06-07 DIAGNOSIS — E119 Type 2 diabetes mellitus without complications: Secondary | ICD-10-CM

## 2024-06-09 ENCOUNTER — Other Ambulatory Visit: Payer: Self-pay

## 2024-06-09 ENCOUNTER — Other Ambulatory Visit (HOSPITAL_COMMUNITY): Payer: Self-pay

## 2024-06-09 MED FILL — Tirzepatide Soln Auto-injector 10 MG/0.5ML: SUBCUTANEOUS | 84 days supply | Qty: 6 | Fill #0 | Status: AC

## 2024-06-09 MED FILL — Tirzepatide Soln Auto-injector 10 MG/0.5ML: SUBCUTANEOUS | 84 days supply | Qty: 6 | Fill #0 | Status: CN

## 2024-06-09 MED FILL — Glipizide Tab ER 24HR 5 MG: ORAL | 90 days supply | Qty: 90 | Fill #0 | Status: CN

## 2024-06-09 NOTE — Telephone Encounter (Signed)
 Requested Prescriptions  Pending Prescriptions Disp Refills   glipiZIDE  (GLUCOTROL  XL) 5 MG 24 hr tablet 90 tablet 0    Sig: Take 1 tablet (5 mg total) by mouth daily with breakfast.     Endocrinology:  Diabetes - Sulfonylureas Failed - 06/09/2024  8:32 AM      Failed - HBA1C is between 0 and 7.9 and within 180 days    HbA1c, POC (controlled diabetic range)  Date Value Ref Range Status  01/29/2022 6.8 0.0 - 7.0 % Final   Hgb A1c MFr Bld  Date Value Ref Range Status  12/02/2023 5.6 <5.7 % Final    Comment:    For the purpose of screening for the presence of diabetes: . <5.7%       Consistent with the absence of diabetes 5.7-6.4%    Consistent with increased risk for diabetes             (prediabetes) > or =6.5%  Consistent with diabetes . This assay result is consistent with a decreased risk of diabetes. . Currently, no consensus exists regarding use of hemoglobin A1c for diagnosis of diabetes in children. . According to American Diabetes Association (ADA) guidelines, hemoglobin A1c <7.0% represents optimal control in non-pregnant diabetic patients. Different metrics may apply to specific patient populations.  Standards of Medical Care in Diabetes(ADA). .          Failed - Valid encounter within last 6 months    Recent Outpatient Visits           6 months ago Encounter for general adult medical examination with abnormal findings   [facility] [place], [place] W, NP              Passed - Cr in normal range and within 360 days    Creat  Date Value Ref Range Status  12/02/2023 0.77 0.50 - 0.99 mg/dL Final   Creatinine, Ser  Date Value Ref Range Status  03/02/2024 0.91 0.44 - 1.00 mg/dL Final   Creatinine, Urine  Date Value Ref Range Status  12/02/2023 148 20 - 275 mg/dL Final          MOUNJARO  10 MG/0.5ML Pen [Pharmacy Med Name: tirzepatide  (MOUNJARO ) 10 MG/0.5ML Pen] 6 mL 1    Sig: Inject 10 mg into the skin once a week.      Off-Protocol Failed - 06/09/2024  8:32 AM      Failed - Medication not assigned to a protocol, review manually.      Passed - Valid encounter within last 12 months    Recent Outpatient Visits           6 months ago Encounter for general adult medical examination with abnormal findings   [facility] [place], Angeline ORN, [place]

## 2024-06-09 NOTE — Telephone Encounter (Signed)
 Requested medication (s) are due for refill today: yes  Requested medication (s) are on the active medication list: yes  Last refill:  12/06/23  Future visit scheduled: yes  Notes to clinic:  Medication not assigned to a protocol, review manually.      Requested Prescriptions  Pending Prescriptions Disp Refills   MOUNJARO  10 MG/0.5ML Pen [Pharmacy Med Name: tirzepatide  (MOUNJARO ) 10 MG/0.5ML Pen] 6 mL 1    Sig: Inject 10 mg into the skin once a week.     Off-Protocol Failed - 06/09/2024  8:33 AM      Failed - Medication not assigned to a protocol, review manually.      Passed - Valid encounter within last 12 months    Recent Outpatient Visits           6 months ago Encounter for general adult medical examination with abnormal findings   [facility] [place], Angeline ORN, NP              Signed Prescriptions Disp Refills   glipiZIDE  (GLUCOTROL  XL) 5 MG 24 hr tablet 90 tablet 0    Sig: Take 1 tablet (5 mg total) by mouth daily with breakfast.     Endocrinology:  Diabetes - Sulfonylureas Failed - 06/09/2024  8:33 AM      Failed - HBA1C is between 0 and 7.9 and within 180 days    HbA1c, POC (controlled diabetic range)  Date Value Ref Range Status  01/29/2022 6.8 0.0 - 7.0 % Final   Hgb A1c MFr Bld  Date Value Ref Range Status  12/02/2023 5.6 <5.7 % Final    Comment:    For the purpose of screening for the presence of diabetes: . <5.7%       Consistent with the absence of diabetes 5.7-6.4%    Consistent with increased risk for diabetes             (prediabetes) > or =6.5%  Consistent with diabetes . This assay result is consistent with a decreased risk of diabetes. . Currently, no consensus exists regarding use of hemoglobin A1c for diagnosis of diabetes in children. . According to American Diabetes Association (ADA) guidelines, hemoglobin A1c <7.0% represents optimal control in non-pregnant diabetic patients. Different metrics may apply  to specific patient populations.  Standards of Medical Care in Diabetes(ADA). .          Failed - Valid encounter within last 6 months    Recent Outpatient Visits           6 months ago Encounter for general adult medical examination with abnormal findings   [place] [place], [place] W, NP              Passed - Cr in normal range and within 360 days    Creat  Date Value Ref Range Status  12/02/2023 0.77 0.50 - 0.99 mg/dL Final   Creatinine, Ser  Date Value Ref Range Status  03/02/2024 0.91 0.44 - 1.00 mg/dL Final   Creatinine, Urine  Date Value Ref Range Status  12/02/2023 148 20 - 275 mg/dL Final

## 2024-06-12 ENCOUNTER — Other Ambulatory Visit: Payer: Self-pay

## 2024-06-13 ENCOUNTER — Other Ambulatory Visit: Payer: Self-pay

## 2024-06-15 ENCOUNTER — Ambulatory Visit: Admitting: Internal Medicine

## 2024-06-15 ENCOUNTER — Encounter: Payer: Self-pay | Admitting: Internal Medicine

## 2024-06-15 ENCOUNTER — Other Ambulatory Visit: Payer: Self-pay

## 2024-06-15 VITALS — BP 124/72 | Ht 64.0 in | Wt 232.4 lb

## 2024-06-15 DIAGNOSIS — E1165 Type 2 diabetes mellitus with hyperglycemia: Secondary | ICD-10-CM | POA: Diagnosis not present

## 2024-06-15 DIAGNOSIS — E1169 Type 2 diabetes mellitus with other specified complication: Secondary | ICD-10-CM

## 2024-06-15 DIAGNOSIS — E1159 Type 2 diabetes mellitus with other circulatory complications: Secondary | ICD-10-CM | POA: Diagnosis not present

## 2024-06-15 DIAGNOSIS — Z7984 Long term (current) use of oral hypoglycemic drugs: Secondary | ICD-10-CM

## 2024-06-15 DIAGNOSIS — I152 Hypertension secondary to endocrine disorders: Secondary | ICD-10-CM | POA: Diagnosis not present

## 2024-06-15 DIAGNOSIS — Z7985 Long-term (current) use of injectable non-insulin antidiabetic drugs: Secondary | ICD-10-CM | POA: Diagnosis not present

## 2024-06-15 DIAGNOSIS — D649 Anemia, unspecified: Secondary | ICD-10-CM | POA: Diagnosis not present

## 2024-06-15 DIAGNOSIS — E785 Hyperlipidemia, unspecified: Secondary | ICD-10-CM | POA: Diagnosis not present

## 2024-06-15 LAB — COMPREHENSIVE METABOLIC PANEL WITH GFR
AG Ratio: 1.1 (calc) (ref 1.0–2.5)
ALT: 15 U/L (ref 6–29)
AST: 14 U/L (ref 10–35)
Albumin: 3.7 g/dL (ref 3.6–5.1)
Alkaline phosphatase (APISO): 56 U/L (ref 31–125)
BUN: 13 mg/dL (ref 7–25)
CO2: 24 mmol/L (ref 20–32)
Calcium: 8.6 mg/dL (ref 8.6–10.2)
Chloride: 105 mmol/L (ref 98–110)
Creat: 0.78 mg/dL (ref 0.50–0.99)
Globulin: 3.4 g/dL (ref 1.9–3.7)
Glucose, Bld: 91 mg/dL (ref 65–99)
Potassium: 4.3 mmol/L (ref 3.5–5.3)
Sodium: 135 mmol/L (ref 135–146)
Total Bilirubin: 0.6 mg/dL (ref 0.2–1.2)
Total Protein: 7.1 g/dL (ref 6.1–8.1)
eGFR: 95 mL/min/1.73m2

## 2024-06-15 LAB — LIPID PANEL
Cholesterol: 98 mg/dL
HDL: 38 mg/dL — ABNORMAL LOW
LDL Cholesterol (Calc): 47 mg/dL
Non-HDL Cholesterol (Calc): 60 mg/dL
Total CHOL/HDL Ratio: 2.6 (calc)
Triglycerides: 44 mg/dL

## 2024-06-15 LAB — HEMOGLOBIN A1C
Hgb A1c MFr Bld: 5.1 %
Mean Plasma Glucose: 100 mg/dL
eAG (mmol/L): 5.5 mmol/L

## 2024-06-15 LAB — CBC
HCT: 36.6 % (ref 35.9–46.0)
Hemoglobin: 11.9 g/dL (ref 11.7–15.5)
MCH: 29.8 pg (ref 27.0–33.0)
MCHC: 32.5 g/dL (ref 31.6–35.4)
MCV: 91.5 fL (ref 81.4–101.7)
MPV: 11.1 fL (ref 7.5–12.5)
Platelets: 212 Thousand/uL (ref 140–400)
RBC: 4 Million/uL (ref 3.80–5.10)
RDW: 15 % (ref 11.0–15.0)
WBC: 3.9 Thousand/uL (ref 3.8–10.8)

## 2024-06-15 MED ORDER — IRON (FERROUS SULFATE) 325 (65 FE) MG PO TABS: 325.0000 mg | ORAL_TABLET | Freq: Every day | ORAL | Status: AC

## 2024-06-15 MED FILL — Glipizide Tab ER 24HR 5 MG: ORAL | 90 days supply | Qty: 90 | Fill #0 | Status: AC

## 2024-06-15 NOTE — Assessment & Plan Note (Signed)
 Complicated by morbid obesity A1c today Urine microalbumin has been checked within the last year Encouraged her to consume a low-carb diet and exercise weight loss Continue glipizide  5 mg daily and tirzepatide  10 mg weekly Encouraged routine eye exam Immunizations UTD Encouraged routine foot exams

## 2024-06-15 NOTE — Assessment & Plan Note (Signed)
 CBC today Continue oral iron  325 mg OTC daily She will continue to follow with hematology

## 2024-06-15 NOTE — Assessment & Plan Note (Signed)
 Complicated by morbid obesity C-Met and lipid profile today Encouraged her to consume a low-fat diet Continue simvastatin  20 mg daily

## 2024-06-15 NOTE — Progress Notes (Signed)
 "  Subjective:    Patient ID: Kathy [name], female    DOB: Jun 25, 1975, 49 y.o.   MRN: [id number]  HPI  Pt presents to the clinic today for 6 month follow up of chronic conditions.  HTN: Associated with diabetes.  Her BP today is 124/72. She is taking losartan , amlodipine  and spironolactone  as prescribed. ECG from 01/2015 reviewed.  HLD: Associated with diabetes.  Her last LDL was 55, triglycerides 62, 11/2023.  She denies myalgias on simvastatin .  She does not consume a low-fat diet.  DM2: Her last A1c was 5.6%, 11/2023.  She does not check her sugars.  She is taking glipizide , but she reports she ran out of her tirzepatide  2 weeks ago.  She checks her feet routinely.  Her last eye exam was 2025.  Flu 03/2024.  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  Prevnar 11/2023. COVID Pfizer x2.  Anemia: Her last H/H was 11.9/36.2, 02/2024.  She has had infusions in the past and is taking oral iron  OTC.  She follows with hematology.  Review of Systems     Past Medical History:  Diagnosis Date   Diabetes mellitus without complication (HCC)    Hypertension     Current Outpatient Medications  Medication Sig Dispense Refill   amLODipine  (NORVASC ) 10 MG tablet Take 1 tablet (10 mg total) by mouth daily. 90 tablet 1   glipiZIDE  (GLUCOTROL  XL) 5 MG 24 hr tablet Take 1 tablet (5 mg total) by mouth daily with breakfast. 90 tablet 0   losartan  (COZAAR ) 100 MG tablet Take 1 tablet (100 mg total) by mouth daily. 90 tablet 1   tirzepatide  (MOUNJARO ) 10 MG/0.5ML Pen Inject 10 mg into the skin once a week. 6 mL 1   simvastatin  (ZOCOR ) 20 MG tablet Take 1 tablet (20 mg total) by mouth at bedtime. 90 tablet 3   spironolactone  (ALDACTONE ) 25 MG tablet Take 1 tablet (25 mg total) by mouth daily. 90 tablet 0   No current facility-administered medications for this visit.    No Known Allergies  Family History  Problem Relation Age of Onset   Hypertension Mother    Colon polyps Mother    Diabetes Maternal Aunt    Breast  cancer Paternal Grandmother     Social History   Socioeconomic History   Marital status: Single    Spouse name: Not on file   Number of children: 3   Years of education: Not on file   Highest education level: Not on file  Occupational History   Not on file  Tobacco Use   Smoking status: Never   Smokeless tobacco: Never  Vaping Use   Vaping status: Never Used  Substance and Sexual Activity   Alcohol use: Yes    Comment: rare; social drinker   Drug use: No   Sexual activity: Yes    Birth control/protection: Surgical  Other Topics Concern   Not on file  Social History Narrative   Not on file   Social Drivers of Health   Tobacco Use: Low Risk (03/02/2024)   Patient History    Smoking Tobacco Use: Never    Smokeless Tobacco Use: Never    Passive Exposure: Not on file  Financial Resource Strain: Not on file  Food Insecurity: No Food Insecurity (12/23/2023)   Epic    Worried About Programme Researcher, Broadcasting/film/video in the Last Year: Never true    Ran Out of Food in the Last Year: Never true  Transportation Needs: No Transportation Needs (12/23/2023)  Epic    Lack of Transportation (Medical): No    Lack of Transportation (Non-Medical): No  Physical Activity: Not on file  Stress: Not on file  Social Connections: Not on file  Intimate Partner Violence: Not At Risk (12/23/2023)   Epic    Fear of Current or Ex-Partner: No    Emotionally Abused: No    Physically Abused: No    Sexually Abused: No  Depression (PHQ2-9): Low Risk (03/02/2024)   Depression (PHQ2-9)    PHQ-2 Score: 0  Alcohol Screen: Low Risk (12/23/2023)   Alcohol Screen    Last Alcohol Screening Score (AUDIT): 0  Housing: Low Risk (12/23/2023)   Epic    Unable to Pay for Housing in the Last Year: No    Number of Times Moved in the Last Year: 0    Homeless in the Last Year: No  Utilities: Not At Risk (12/23/2023)   Epic    Threatened with loss of utilities: No  Health Literacy: Not on file     Constitutional: Denies  fever, fatigue, malaise, headache or abrupt weight changes.  HEENT: Denies eye pain, eye redness, ear pain, ringing in the ears, wax buildup, runny nose, nasal congestion, bloody nose, or sore throat. Respiratory: Denies difficulty breathing, shortness of breath, cough or sputum production.   Cardiovascular: Denies chest pain, chest tightness, palpitations or swelling in the hands and feet.  Gastrointestinal: Denies abdominal pain, bloating, constipation, diarrhea or blood in the stool.  GU: Denies urgency, frequency, pain with urination, burning sensation, blood in urine, odor or discharge. Musculoskeletal: Denies decrease in range of motion, difficulty with gait, muscle pain or joint pain and swelling.  Skin: Denies redness, rashes, lesions or ulcercations.  Neurological: Denies dizziness, difficulty with memory, difficulty with speech or problems with balance and coordination.  Psych: Denies anxiety, depression, SI/HI.  No other specific complaints in a complete review of systems (except as listed in HPI above).  Objective:   Physical Exam  BP 124/72 (BP Location: Left Arm, Patient Position: Sitting, Cuff Size: Large)   Ht 5' 4 (1.626 m)   Wt 232 lb 6.4 oz (105.4 kg)   LMP  (LMP Unknown)   BMI 39.89 kg/m    Wt Readings from Last 3 Encounters:  03/02/24 229 lb 12.8 oz (104.2 kg)  12/23/23 229 lb 6.4 oz (104.1 kg)  12/02/23 230 lb 9.6 oz (104.6 kg)    General: Appears her stated age, obese, in NAD. Skin: Warm, dry and intact. No ulcerations noted. HEENT: Head: normal shape and size; Eyes: sclera white, no icterus, conjunctiva pink, PERRLA and EOMs intact;  Cardiovascular: Normal rate and rhythm. S1,S2 noted.  No murmur, rubs or gallops noted.  No BLE edema.  Pulmonary/Chest: Normal effort and positive vesicular breath sounds. No respiratory distress. No wheezes, rales or ronchi noted.  Musculoskeletal: No difficulty with gait.  Neurological: Alert and oriented.  Coordination  normal.    BMET    Component Value Date/Time   NA 133 (L) 03/02/2024 0953   K 4.2 03/02/2024 0953   CL 105 03/02/2024 0953   CO2 23 03/02/2024 0953   GLUCOSE 96 03/02/2024 0953   BUN 18 03/02/2024 0953   CREATININE 0.91 03/02/2024 0953   CREATININE 0.77 12/02/2023 1025   CALCIUM 8.8 (L) 03/02/2024 0953   GFRNONAA >60 03/02/2024 0953   GFRNONAA 108 12/04/2020 0757   GFRAA 125 12/04/2020 0757    Lipid Panel     Component Value Date/Time   CHOL 106 12/02/2023  1025   TRIG 62 12/02/2023 1025   HDL 37 (L) 12/02/2023 1025   CHOLHDL 2.9 12/02/2023 1025   VLDL 55.4 (H) 08/28/2020 1507   LDLCALC 55 12/02/2023 1025    CBC    Component Value Date/Time   WBC 3.5 (L) 03/02/2024 0953   WBC 3.9 (L) 12/23/2023 1130   RBC 4.29 03/02/2024 0953   HGB 11.9 (L) 03/02/2024 0953   HCT 36.2 03/02/2024 0953   PLT 222 03/02/2024 0953   MCV 84.4 03/02/2024 0953   MCH 27.7 03/02/2024 0953   MCHC 32.9 03/02/2024 0953   RDW 18.9 (H) 03/02/2024 0953   LYMPHSABS 1.7 03/02/2024 0953   MONOABS 0.3 03/02/2024 0953   EOSABS 0.1 03/02/2024 0953   BASOSABS 0.0 03/02/2024 0953    Hgb A1C Lab Results  Component Value Date   HGBA1C 5.6 12/02/2023           Assessment & Plan:      RTC in 6 months for your annual exam Angeline Laura, NP  "

## 2024-06-15 NOTE — Assessment & Plan Note (Signed)
 Encourage diet and exercise for weight loss

## 2024-06-15 NOTE — Patient Instructions (Signed)

## 2024-06-15 NOTE — Assessment & Plan Note (Signed)
 Complicated by morbid obesity Controlled on losartan  100 mg, amlodipine  10 mg and spironolactone  25 mg daily Reinforced DASH diet and exercise for weight loss C-Met today

## 2024-06-16 ENCOUNTER — Ambulatory Visit: Payer: Self-pay | Admitting: Internal Medicine

## 2024-06-30 ENCOUNTER — Other Ambulatory Visit: Payer: Self-pay

## 2024-06-30 ENCOUNTER — Other Ambulatory Visit (HOSPITAL_COMMUNITY): Payer: Self-pay

## 2024-06-30 ENCOUNTER — Other Ambulatory Visit: Payer: Self-pay | Admitting: Internal Medicine

## 2024-06-30 MED ORDER — AMLODIPINE BESYLATE 10 MG PO TABS
10.0000 mg | ORAL_TABLET | Freq: Every day | ORAL | 1 refills | Status: AC
  Filled 2024-06-30: qty 90, 90d supply, fill #0

## 2024-06-30 MED FILL — Simvastatin Tab 20 MG: ORAL | 90 days supply | Qty: 90 | Fill #2 | Status: AC

## 2024-06-30 NOTE — Telephone Encounter (Signed)
 Requested Prescriptions  Pending Prescriptions Disp Refills   amLODipine  (NORVASC ) 10 MG tablet 90 tablet 1    Sig: Take 1 tablet (10 mg total) by mouth daily.     Cardiovascular: Calcium Channel Blockers 2 Passed - 06/30/2024 11:57 AM      Passed - Last BP in normal range    BP Readings from Last 1 Encounters:  06/15/24 124/72         Passed - Last Heart Rate in normal range    Pulse Readings from Last 1 Encounters:  03/02/24 (!) 52         Passed - Valid encounter within last 6 months    Recent Outpatient Visits           2 weeks ago Type 2 diabetes mellitus with hyperglycemia, without long-term current use of insulin [facility])   [place] [facility] [place], Angeline ORN, NP   7 months ago Encounter for general adult medical examination with abnormal findings   [place] [facility] [place], Angeline ORN, NP

## 2024-07-03 ENCOUNTER — Ambulatory Visit

## 2024-07-03 ENCOUNTER — Other Ambulatory Visit

## 2024-07-03 ENCOUNTER — Ambulatory Visit: Admitting: Internal Medicine

## 2024-07-04 ENCOUNTER — Other Ambulatory Visit (HOSPITAL_COMMUNITY): Payer: Self-pay

## 2024-07-04 ENCOUNTER — Other Ambulatory Visit: Payer: Self-pay | Admitting: Internal Medicine

## 2024-07-04 MED ORDER — LOSARTAN POTASSIUM 100 MG PO TABS
100.0000 mg | ORAL_TABLET | Freq: Every day | ORAL | 1 refills | Status: AC
  Filled 2024-07-04: qty 90, 90d supply, fill #0

## 2024-07-04 NOTE — Telephone Encounter (Signed)
 Requested Prescriptions  Pending Prescriptions Disp Refills   losartan  (COZAAR ) 100 MG tablet 90 tablet 1    Sig: Take 1 tablet (100 mg total) by mouth daily.     Cardiovascular:  Angiotensin Receptor Blockers Passed - 07/04/2024  5:48 PM      Passed - Cr in normal range and within 180 days    Creat  Date Value Ref Range Status  06/15/2024 0.78 0.50 - 0.99 mg/dL Final   Creatinine, Urine  Date Value Ref Range Status  12/02/2023 148 20 - 275 mg/dL Final         Passed - K in normal range and within 180 days    Potassium  Date Value Ref Range Status  06/15/2024 4.3 3.5 - 5.3 mmol/L Final         Passed - Patient is not pregnant      Passed - Last BP in normal range    BP Readings from Last 1 Encounters:  06/15/24 124/72         Passed - Valid encounter within last 6 months    Recent Outpatient Visits           2 weeks ago Type 2 diabetes mellitus with hyperglycemia, without long-term current use of insulin [facility])   [place] [facility] [place], Angeline ORN, NP   7 months ago Encounter for general adult medical examination with abnormal findings   [place] [facility] [place], Angeline ORN, NP

## 2024-07-05 ENCOUNTER — Other Ambulatory Visit: Payer: Self-pay

## 2024-07-05 ENCOUNTER — Other Ambulatory Visit: Payer: Self-pay | Admitting: Internal Medicine

## 2024-07-06 ENCOUNTER — Other Ambulatory Visit (HOSPITAL_COMMUNITY): Payer: Self-pay

## 2024-07-06 MED ORDER — SPIRONOLACTONE 25 MG PO TABS
25.0000 mg | ORAL_TABLET | Freq: Every day | ORAL | 1 refills | Status: AC
  Filled 2024-07-06: qty 90, 90d supply, fill #0

## 2024-07-06 NOTE — Telephone Encounter (Signed)
 Requested Prescriptions  Pending Prescriptions Disp Refills   spironolactone  (ALDACTONE ) 25 MG tablet 90 tablet 1    Sig: Take 1 tablet (25 mg total) by mouth daily.     Cardiovascular: Diuretics - Aldosterone Antagonist Passed - 07/06/2024 10:23 AM      Passed - Cr in normal range and within 180 days    Creat  Date Value Ref Range Status  06/15/2024 0.78 0.50 - 0.99 mg/dL Final   Creatinine, Urine  Date Value Ref Range Status  12/02/2023 148 20 - 275 mg/dL Final         Passed - K in normal range and within 180 days    Potassium  Date Value Ref Range Status  06/15/2024 4.3 3.5 - 5.3 mmol/L Final         Passed - Na in normal range and within 180 days    Sodium  Date Value Ref Range Status  06/15/2024 135 135 - 146 mmol/L Final         Passed - eGFR is 30 or above and within 180 days    GFR, Est African American  Date Value Ref Range Status  12/04/2020 125 > OR = 60 mL/min/1.53m2 Final   GFR, Est Non African American  Date Value Ref Range Status  12/04/2020 108 > OR = 60 mL/min/1.45m2 Final   GFR, Estimated  Date Value Ref Range Status  03/02/2024 >60 >60 mL/min Final    Comment:    (NOTE) Calculated using the CKD-EPI Creatinine Equation (2021)    GFR  Date Value Ref Range Status  09/17/2020 106.57 >60.00 mL/min Final    Comment:    Calculated using the CKD-EPI Creatinine Equation (2021)   eGFR  Date Value Ref Range Status  06/15/2024 95 > OR = 60 mL/min/1.25m2 Final         Passed - Last BP in normal range    BP Readings from Last 1 Encounters:  06/15/24 124/72         Passed - Valid encounter within last 6 months    Recent Outpatient Visits           3 weeks ago Type 2 diabetes mellitus with hyperglycemia, without long-term current use of insulin [facility])   [facility] [place], Angeline ORN, NP   7 months ago Encounter for general adult medical examination with abnormal findings   [place] [facility] [place],  Angeline ORN, NP

## 2024-07-20 ENCOUNTER — Inpatient Hospital Stay

## 2024-07-20 ENCOUNTER — Inpatient Hospital Stay: Admitting: Internal Medicine

## 2024-12-07 ENCOUNTER — Encounter: Admitting: Internal Medicine
